# Patient Record
Sex: Female | Born: 1995 | Race: White | Hispanic: No | Marital: Single | Smoking: Former smoker
Health system: Southern US, Community
[De-identification: ages and names within clinical notes are randomized; demographics above are authoritative.]

## PROBLEM LIST (undated history)

## (undated) ENCOUNTER — Emergency Department (HOSPITAL_COMMUNITY): Admission: EM | Payer: No Typology Code available for payment source | Source: Home / Self Care

## (undated) DIAGNOSIS — F319 Bipolar disorder, unspecified: Secondary | ICD-10-CM

## (undated) DIAGNOSIS — F32A Depression, unspecified: Secondary | ICD-10-CM

## (undated) DIAGNOSIS — G932 Benign intracranial hypertension: Secondary | ICD-10-CM

## (undated) DIAGNOSIS — Z23 Encounter for immunization: Secondary | ICD-10-CM

## (undated) DIAGNOSIS — B001 Herpesviral vesicular dermatitis: Secondary | ICD-10-CM

## (undated) DIAGNOSIS — L509 Urticaria, unspecified: Secondary | ICD-10-CM

## (undated) HISTORY — DX: Herpesviral vesicular dermatitis: B00.1

## (undated) HISTORY — DX: Urticaria, unspecified: L50.9

## (undated) HISTORY — PX: OTHER SURGICAL HISTORY: SHX169

## (undated) HISTORY — PX: WISDOM TOOTH EXTRACTION: SHX21

## (undated) HISTORY — PX: TYMPANOSTOMY TUBE PLACEMENT: SHX32

## (undated) HISTORY — DX: Benign intracranial hypertension: G93.2

## (undated) HISTORY — DX: Encounter for immunization: Z23

---
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
## 2014-01-30 ENCOUNTER — Ambulatory Visit (INDEPENDENT_AMBULATORY_CARE_PROVIDER_SITE_OTHER): Payer: 59 | Admitting: Neurology

## 2014-01-30 ENCOUNTER — Encounter: Payer: Self-pay | Admitting: *Deleted

## 2014-01-30 ENCOUNTER — Encounter: Payer: Self-pay | Admitting: Neurology

## 2014-01-30 VITALS — BP 94/68 | HR 70 | Ht 64.96 in | Wt 191.3 lb

## 2014-01-30 DIAGNOSIS — R51 Headache: Secondary | ICD-10-CM

## 2014-01-30 DIAGNOSIS — H4710 Unspecified papilledema: Secondary | ICD-10-CM

## 2014-01-30 LAB — BASIC METABOLIC PANEL
BUN: 12 mg/dL (ref 6–23)
CALCIUM: 9.6 mg/dL (ref 8.4–10.5)
CO2: 24 meq/L (ref 19–32)
CREATININE: 0.75 mg/dL (ref 0.50–1.10)
Chloride: 109 mEq/L (ref 96–112)
Glucose, Bld: 88 mg/dL (ref 70–99)
Potassium: 4.6 mEq/L (ref 3.5–5.3)
Sodium: 140 mEq/L (ref 135–145)

## 2014-01-30 NOTE — Patient Instructions (Signed)
1.  MRI brain wwo contrast 2.  MRV brain 3.  Check blood work today 4.  Return to clinic in 3 weeks

## 2014-01-30 NOTE — Progress Notes (Signed)
[facility] Clinic Note - Initial Visit   Date: 01/30/2014  Emily Hernandez MRN: [id number] DOB: 09-17-1995   Dear Dr Wynetta Emery:  Thank you for your kind referral of Emily Hernandez for consultation of headaches and papilledema. Although her history is well known to you, please allow [name] to reiterate it for the purpose of our medical record. The patient was accompanied to the clinic by her parents who also provides collateral information.     History of Present Illness: Emily Hernandez is a 18 y.o. right-handed Caucasian female with no prior medical history presenting for evaluation of headaches and papilledema.    She has a long history of headaches which started in her teen years.  Headaches are bitemporal or at the base of her head, described as stabbing sensation.  Headaches last all day until she rests, occuring almost daily.  It is improved with rest and exedrin and worse with physical exertion (walking up steps) or change in temperatures.  She has associated photophobia and nausea.  She was previously getting headaches 2-3 times per week, but starting in March they occurred almost daily, which she initially attributed to poor vision so saw her eye doctor who prescribed new glasses for changes in visual acuity.  Papilledema was not present at the initial visit.  Despite changing her eye prescriptions, headaches did not improve so she was re-evaluated on 01/25/2014 by Dr. Len Blalock whose note were reviewed.  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  Visual field testing was normal.  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  Of note, she has gained 40lb in the past 1.5 years.  Yesterday morning, she woke up very lightheaded which was new any persisted throughout the rest of the day.  She also had an appointment  with her PCP yesterday and does not recall why she left an hour early.  Her step-father also says that she [place]   Denies any numbness/tingling, weakness, dysarthria, bowel/bladder incontinence.  No family history of migraines.     PMHx:  None  Past Surgical History  Procedure Laterality Date  . None       Medications:  Current Outpatient Prescriptions on File Prior to Visit  Medication Sig Dispense Refill  . ibuprofen (ADVIL,MOTRIN) 200 MG tablet Take 400-600 mg by mouth every 6 (six) hours as needed. For pain        No current facility-administered medications on file prior to visit.    Allergies:  Allergies  Allergen Reactions  . Penicillins     Swelling and Hives    Family History: Family History  Problem Relation Age of Onset  . GI Bleed Maternal Grandmother   . Heart attack Maternal Grandmother   . Healthy Brother     Social History: History   Social History  . Marital Status: Single    Spouse Name: N/A    Number of Children: 0  . Years of Education: N/A   Occupational History  . server    Social History Main Topics  . Smoking status: Current Every Day Smoker -- 0.25 packs/day for 2 years    Types: Cigarettes  . Smokeless tobacco: Former Systems developer    Quit date: 08/17/2012  . Alcohol Use: No  .  Drug Use: No  . Sexual Activity: No   Other Topics Concern  . Not on file   Social History Narrative   She is currently a Educational psychologist at [employer] level of education:  Some college   Lives with mother and stepfather.    Review of Systems:  CONSTITUTIONAL: No fevers, chills, night sweats, or weight loss.   EYES: +visual changes or eye pain ENT: No hearing changes.  No history of nose bleeds.   RESPIRATORY: No cough, wheezing and shortness of breath.   CARDIOVASCULAR: Negative for chest pain, and palpitations.   GI: Negative for abdominal discomfort, blood in  stools or black stools.  No recent change in bowel habits.   GU:  No history of incontinence.   MUSCLOSKELETAL: No history of joint pain or swelling.  No myalgias.   SKIN: Negative for lesions, rash, and itching.   HEMATOLOGY/ONCOLOGY: Negative for prolonged bleeding, bruising easily, and swollen nodes.  No history of cancer.   ENDOCRINE: Negative for cold or heat intolerance, polydipsia or goiter.   PSYCH:  No depression or anxiety symptoms.   NEURO: As Above.   Vital Signs:  BP 94/68  Pulse 70  Ht 5' 4.96" (1.65 m)  Wt 191 lb 5 oz (86.779 kg)  BMI 31.87 kg/m2  SpO2 98%   General Medical Exam:   General:  Well appearing, comfortable, obese.   Eyes/ENT: see cranial nerve examination.   Neck: No masses appreciated.  Full range of motion without tenderness.  No carotid bruits. Respiratory:  Clear to auscultation, good air entry bilaterally.   Cardiac:  Regular rate and rhythm, no murmur.   Extremities:  No deformities, edema, or skin discoloration. Good capillary refill.   Skin:  Skin color, texture, turgor normal. No rashes or lesions.  Neurological Exam: MENTAL STATUS including orientation to time, place, person, recent and remote memory, attention span and concentration, language, and fund of knowledge is normal.  Speech is not dysarthric.  CRANIAL NERVES: II:  No visual field defects.  Unremarkable fundi, unable to appreciate papilledema on non-dilated exam.   III-IV-VI: Pupils equal round and reactive to light.  Normal conjugate, extra-ocular eye movements in all directions of gaze.  No nystagmus.  No ptosis.   V:  Normal facial sensation.     VII:  Normal facial symmetry and movements. VIII:  Normal hearing and vestibular function.   IX-X:  Normal palatal movement.   XI:  Normal shoulder shrug and head rotation.   XII:  Normal tongue strength and range of motion, no deviation or fasciculation.  MOTOR:  No atrophy, fasciculations or abnormal movements.  No pronator drift.   Tone is normal.    Right Upper Extremity:    Left Upper Extremity:    Deltoid  5/5   Deltoid  5/5   Biceps  5/5   Biceps  5/5   Triceps  5/5   Triceps  5/5   Wrist extensors  5/5   Wrist extensors  5/5   Wrist flexors  5/5   Wrist flexors  5/5   Finger extensors  5/5   Finger extensors  5/5   Finger flexors  5/5   Finger flexors  5/5   Dorsal interossei  5/5   Dorsal interossei  5/5   Abductor pollicis  5/5   Abductor pollicis  5/5   Tone (Ashworth scale)  0  Tone (Ashworth scale)  0   Right Lower Extremity:    Left Lower Extremity:  Hip flexors  5/5   Hip flexors  5/5   Hip extensors  5/5   Hip extensors  5/5   Knee flexors  5/5   Knee flexors  5/5   Knee extensors  5/5   Knee extensors  5/5   Dorsiflexors  5/5   Dorsiflexors  5/5   Plantarflexors  5/5   Plantarflexors  5/5   Toe extensors  5/5   Toe extensors  5/5   Toe flexors  5/5   Toe flexors  5/5   Tone (Ashworth scale)  0  Tone (Ashworth scale)  0   MSRs:  Right                                                                 Left brachioradialis 2+  brachioradialis 2+  biceps 2+  biceps 2+  triceps 2+  triceps 2+  patellar 2+  patellar 2+  ankle jerk 2+  ankle jerk 2+  Hoffman no  Hoffman no  plantar response down  plantar response down   SENSORY:  Normal and symmetric perception of light touch, pinprick, vibration, and proprioception.  Romberg's sign absent.   COORDINATION/GAIT: Normal finger-to- nose-finger and heel-to-shin.  Intact rapid alternating movements bilaterally.  Able to rise from a chair without using arms.  Gait narrow based and stable. Tandem and stressed gait intact.    IMPRESSION/PLAN: Miss Coker is a 18 year-old female presenting for evaluation of headaches and papilledema.  Neurological exam is non-focal and I am unable to appreciate papilledema on non-dilated exam today.  I would like to obtain MRI brain and MRV wwo contrast to look for potential intracranial abnormalities, venous thrombosis  (given that she is on oral contraception), or signs of raised ICP.  High on the differential is idiopathic intracranial hypertension which was explained to patient and family.  If imaging is nondiagnostic, lumbar puncture with opening pressure will be the next step.  From a general standpoint of well-being, weight loss was encouraged.    I offered the patient/parents the opportunity to ask questions and answered them to the best of my ability.   I will see her back in 3 weeks or sooner as needed.    The duration of this appointment visit was 45 minutes of face-to-face time with the patient.  Greater than 50% of this time was spent in counseling, explanation of diagnosis, planning of further management, and coordination of care.   Thank you for allowing me to participate in patient's care.  If I can answer any additional questions, I would be pleased to do so.    Sincerely,    Donika K. Posey Pronto, DO

## 2014-01-30 NOTE — Progress Notes (Signed)
Note faxed.

## 2014-02-04 ENCOUNTER — Ambulatory Visit
Admission: RE | Admit: 2014-02-04 | Discharge: 2014-02-04 | Disposition: A | Discharge status: Home / Self Care | Payer: 59 | Source: Ambulatory Visit | Attending: Neurology | Admitting: Neurology

## 2014-02-04 DIAGNOSIS — R51 Headache: Secondary | ICD-10-CM

## 2014-02-04 DIAGNOSIS — H4710 Unspecified papilledema: Secondary | ICD-10-CM

## 2014-02-04 MED ORDER — GADOBENATE DIMEGLUMINE 529 MG/ML IV SOLN
18.0000 mL | Freq: Once | INTRAVENOUS | Status: AC | PRN
  Administered 2014-02-04: 18 mL via INTRAVENOUS

## 2014-02-05 ENCOUNTER — Other Ambulatory Visit: Payer: Self-pay | Admitting: *Deleted

## 2014-02-05 ENCOUNTER — Other Ambulatory Visit: Payer: Self-pay | Admitting: Neurology

## 2014-02-05 ENCOUNTER — Telehealth: Payer: Self-pay | Admitting: Neurology

## 2014-02-05 DIAGNOSIS — R42 Dizziness and giddiness: Secondary | ICD-10-CM

## 2014-02-05 DIAGNOSIS — R51 Headache: Principal | ICD-10-CM

## 2014-02-05 DIAGNOSIS — R519 Headache, unspecified: Secondary | ICD-10-CM

## 2014-02-05 DIAGNOSIS — G379 Demyelinating disease of central nervous system, unspecified: Secondary | ICD-10-CM

## 2014-02-05 NOTE — Telephone Encounter (Signed)
Called patient and discussed with mother the results of MRI/V brain.  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    Check CSF cell count and diff, protein, glucose, routine cultures, fungal cultures, IgG index, oligoclonal bands, myelin basic protein, flow cytology, ACE.  Specify in comments "need opening and closing pressure to evaluate for pseudotumor cerebri"    Donika K. Posey Pronto, DO

## 2014-02-05 NOTE — Telephone Encounter (Signed)
Order placed.  Emily Hernandez will call patient to set up the appointment.

## 2014-02-07 ENCOUNTER — Ambulatory Visit
Admission: RE | Admit: 2014-02-07 | Discharge: 2014-02-07 | Disposition: A | Discharge status: Home / Self Care | Payer: 59 | Source: Ambulatory Visit | Attending: Neurology | Admitting: Neurology

## 2014-02-07 ENCOUNTER — Other Ambulatory Visit: Payer: Self-pay | Admitting: Neurology

## 2014-02-07 ENCOUNTER — Other Ambulatory Visit: Payer: Self-pay | Admitting: *Deleted

## 2014-02-07 ENCOUNTER — Other Ambulatory Visit (HOSPITAL_COMMUNITY)
Admission: RE | Admit: 2014-02-07 | Discharge: 2014-02-07 | Disposition: A | Discharge status: Home / Self Care | Payer: 59 | Source: Ambulatory Visit | Attending: Neurology | Admitting: Neurology

## 2014-02-07 VITALS — BP 102/46 | HR 77

## 2014-02-07 DIAGNOSIS — G379 Demyelinating disease of central nervous system, unspecified: Secondary | ICD-10-CM

## 2014-02-07 DIAGNOSIS — R519 Headache, unspecified: Secondary | ICD-10-CM

## 2014-02-07 DIAGNOSIS — R42 Dizziness and giddiness: Secondary | ICD-10-CM

## 2014-02-07 DIAGNOSIS — R51 Headache: Secondary | ICD-10-CM

## 2014-02-07 LAB — GLUCOSE, CSF: GLUCOSE CSF: 59 mg/dL (ref 43–76)

## 2014-02-07 LAB — CSF CELL COUNT WITH DIFFERENTIAL
RBC Count, CSF: 0 cu mm
Tube #: 3
WBC, CSF: 1 cu mm (ref 0–5)

## 2014-02-07 LAB — PROTEIN, CSF: Total Protein, CSF: 28 mg/dL (ref 15–45)

## 2014-02-07 MED ORDER — DIAZEPAM 5 MG PO TABS
10.0000 mg | ORAL_TABLET | Freq: Once | ORAL | Status: AC
  Administered 2014-02-07: 5 mg via ORAL

## 2014-02-07 NOTE — Discharge Instructions (Signed)

## 2014-02-07 NOTE — Progress Notes (Signed)
Blood drawn from left AC to go with spinal fluid. Site is unremarkable and pt tolerated procedure well. Discharge instructions explained to pt.

## 2014-02-09 ENCOUNTER — Other Ambulatory Visit: Payer: Self-pay | Admitting: *Deleted

## 2014-02-09 ENCOUNTER — Telehealth: Payer: Self-pay | Admitting: Neurology

## 2014-02-09 ENCOUNTER — Encounter: Payer: Self-pay | Admitting: *Deleted

## 2014-02-09 LAB — MYELIN BASIC PROTEIN, CSF: Myelin Basic Protein: <2 mcg/L (ref 0.0–4.0)

## 2014-02-09 LAB — ANGIOTENSIN CONVERTING ENZYME, CSF: ACE, CSF: 1 U/L (ref <=15)

## 2014-02-09 MED ORDER — BUTALBITAL-APAP-CAFFEINE 50-325-40 MG PO TABS: 1.0000 | ORAL_TABLET | Freq: Four times a day (QID) | ORAL | Status: DC | PRN

## 2014-02-09 NOTE — Telephone Encounter (Signed)
Note and Rx faxed.

## 2014-02-09 NOTE — Telephone Encounter (Signed)
Returned call to patient.  Instructed to stay well hydrated and increase caffeine.  Fioricet sent to pharmacy.  Will give work excuse from 6/26-6/29 to fax to [phone] Attn:  Jodean Lima.  Donika K. Posey Pronto, DO

## 2014-02-09 NOTE — Telephone Encounter (Signed)
Patient just called stating that she has been having headaches everytime she gets up.  She has LP done on Wednesday.  She was on her way to work today but her head started hurting so bad she thought she was going to get sick.  She turned around and went back home.  Does she need to try to work?

## 2014-02-09 NOTE — Telephone Encounter (Signed)
Pt needs to talk to someone about headache [phone]

## 2014-02-10 LAB — CSF CULTURE: Gram Stain: NONE SEEN

## 2014-02-10 LAB — CSF CULTURE W GRAM STAIN
Gram Stain: NONE SEEN
Organism ID, Bacteria: NO GROWTH

## 2014-02-12 ENCOUNTER — Other Ambulatory Visit: Payer: Self-pay | Admitting: *Deleted

## 2014-02-12 ENCOUNTER — Telehealth: Payer: Self-pay | Admitting: Neurology

## 2014-02-12 DIAGNOSIS — R51 Headache: Secondary | ICD-10-CM

## 2014-02-12 NOTE — Telephone Encounter (Signed)
Pt's mother called wanting to know what is next since pt has had her lumbar puncture and is still having headaches.  C/b [phone]

## 2014-02-12 NOTE — Telephone Encounter (Signed)
Called Emily Hernandez to let her know that I am waiting to hear back about the appointment.

## 2014-02-12 NOTE — Telephone Encounter (Signed)
No relief with conservative measures.  Let's proceed with blood patch.  Discussed with Abigail Butts (patient's mom), who is agreeable.  They will cancel if headache improves.  Donika K. Posey Pronto, DO

## 2014-02-12 NOTE — Telephone Encounter (Signed)
Order placed.  Left message for Andee Poles to call me back to schedule appointment.

## 2014-02-12 NOTE — Telephone Encounter (Signed)
Please advise on what to tell her mother.  Thanks.

## 2014-02-13 ENCOUNTER — Other Ambulatory Visit: Payer: Self-pay | Admitting: *Deleted

## 2014-02-13 DIAGNOSIS — R51 Headache: Secondary | ICD-10-CM

## 2014-02-13 LAB — OLIGOCLONAL BANDS, CSF + SERM

## 2014-02-13 NOTE — Telephone Encounter (Signed)
Called Emily Hernandez back and she said that Solmon Ice has an appointment tomorrow at [facility]

## 2014-02-13 NOTE — Telephone Encounter (Signed)
Pt's mother called again at 12:05PM to f/u on the appointment. C/B [phone]

## 2014-02-14 ENCOUNTER — Inpatient Hospital Stay
Admission: RE | Admit: 2014-02-14 | Discharge: 2014-02-14 | Disposition: A | Discharge status: Home / Self Care | Payer: 59 | Source: Ambulatory Visit | Attending: Neurology | Admitting: Neurology

## 2014-02-14 LAB — HSV(HERPES SMPLX VRS)ABS-I+II(IGG)-CSF

## 2014-02-14 NOTE — Discharge Instructions (Signed)

## 2014-02-20 ENCOUNTER — Ambulatory Visit (INDEPENDENT_AMBULATORY_CARE_PROVIDER_SITE_OTHER): Payer: 59 | Admitting: Neurology

## 2014-02-20 ENCOUNTER — Encounter: Payer: Self-pay | Admitting: Neurology

## 2014-02-20 VITALS — BP 110/78 | HR 71 | Ht 65.35 in | Wt 195.4 lb

## 2014-02-20 DIAGNOSIS — E669 Obesity, unspecified: Secondary | ICD-10-CM

## 2014-02-20 DIAGNOSIS — G932 Benign intracranial hypertension: Secondary | ICD-10-CM | POA: Insufficient documentation

## 2014-02-20 MED ORDER — TOPIRAMATE 25 MG PO TABS: ORAL_TABLET | ORAL | Status: DC

## 2014-02-20 NOTE — Progress Notes (Signed)
Follow-up Visit   Date: 02/20/2014    Emily Hernandez MRN: [id number] DOB: November 04, 1995   Interim History: Emily Hernandez is a 18 y.o. right-handed Caucasian female with no prior medical history returning to the clinic for follow-up of headaches and papilledema.  The patient was accompanied to the clinic by parents who also provides collateral information.    History of present illness: She has a long history of headaches which started in her teen years. Headaches are bitemporal or at the base of her head, described as stabbing sensation. Headaches last all day until she rests, occuring almost daily. It is improved with rest and exedrin and worse with physical exertion (walking up steps) or change in temperatures. She has associated photophobia and nausea. She was previously getting headaches 2-3 times per week, but starting in March they occurred almost daily, which she initially attributed to poor vision so saw her eye doctor who prescribed new glasses for changes in visual acuity. Papilledema was not present at the initial visit. Despite changing her eye prescriptions, headaches did not improve so she was re-evaluated on 01/25/2014 by Dr. Len Blalock whose note were reviewed. 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 Visual field testing was normal. 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 Of note, she has gained 40lb in the past 1.5 years.   Yesterday morning, she woke up very lightheaded which was new any persisted throughout the rest of the day. She also had an appointment with her PCP yesterday and does not recall why she left an hour early. Her step-father also says that she [place]   UPDATE 02/20/2014:  She underwent MRI/V brain which was normal and lumbar  puncture which showed OP 73mmHg with normal cell count and protein.  Unfortunately, she had post-LP headache which fortunately did improve with conservative measures.   Since the past week, she has not experienced constant daily headaches, but continues to have intermittent stabbing pain over the bitemporal region, lasting 5-10 seconds, occuring 3-4 times throughout the day especially when active.  She has not needed to take any medication for these because they are too brief.      Medications:  Current Outpatient Prescriptions on File Prior to Visit  Medication Sig Dispense Refill  . butalbital-acetaminophen-caffeine (FIORICET, ESGIC) 50-325-40 MG per tablet Take 1 tablet by mouth every 6 (six) hours as needed for headache.  20 tablet  0  . ibuprofen (ADVIL,MOTRIN) 200 MG tablet Take 400-600 mg by mouth every 6 (six) hours as needed. For pain       . medroxyPROGESTERone (DEPO-PROVERA) 150 MG/ML injection        No current facility-administered medications on file prior to visit.    Allergies:  Allergies  Allergen Reactions  . Penicillins     Swelling and Hives     Review of Systems:  CONSTITUTIONAL: No fevers, chills, night sweats, or weight loss.   EYES: No visual changes or eye pain ENT: No hearing changes.  No history of nose bleeds.   RESPIRATORY: No cough, wheezing and shortness of breath.   CARDIOVASCULAR: Negative for chest pain, and palpitations.   GI: Negative for abdominal discomfort, blood in stools or black stools.  No recent change in bowel habits.   GU:  No history of incontinence.   MUSCLOSKELETAL: No history of joint pain or swelling.  No myalgias.   SKIN: Negative for lesions, rash, and itching.   ENDOCRINE: Negative for cold or heat intolerance, polydipsia or goiter.   PSYCH:  No depression or anxiety symptoms.   NEURO: As Above.   Vital Signs:  BP 110/78  Pulse 71  Ht 5' 5.35" (1.66 m)  Wt 195 lb 6 oz (88.622 kg)  BMI 32.16 kg/m2  SpO2  98%  Neurological Exam: MENTAL STATUS including orientation to time, place, person, recent and remote memory, attention span and concentration, language, and fund of knowledge is normal.  Speech is not dysarthric.  CRANIAL NERVES: No visual field defects.  Pupils equal round and reactive to light.  Normal conjugate, extra-ocular eye movements in all directions of gaze.  No ptosis. Normal facial sensation.  Face is symmetric. Palate elevates symmetrically.  Tongue is midline.  MOTOR:  Motor strength is 5/5 in all extremities.  No atrophy, fasciculations or abnormal movements.  No pronator drift.  Tone is normal.    MSRs:  Reflexes are 2+/4 throughout..  SENSORY:  Intact to to vibration throughout.  COORDINATION/GAIT:  Normal finger-to- nose-finger and heel-to-shin.  Intact rapid alternating movements bilaterally.  Gait narrow based and stable.   Data: CSF 02/07/2014:  R0 W1 G59 P28    ACE 1, MBP < 2.0, no OCB, HSV neg, cytology negative, cytometry (insufficient sample)  MRI/V brain wwo contrast 02/07/2014: No evidence of venous thrombosis.  Normal appearance of the brain parenchyma. 5 x 7 mm pineal cyst. These are seen in up to 5% of normal examinations. Usually, these are incidental and insignificant, especially when this small and not associated with any complicating features. In certain atypical cases, they can be associated with headache.   IMPRESSION: 1.  Idiopathic intracranial hypertension, mild  - OP 64mmHg after 19.56mL CSF removed, CP was 51mmHg (02/07/2014)  - Imaging does not disclosure any intracranial abnormalities.  Exam non-focal without evidence of papilledema  - Start topamax.  - Encouraged weight loss  - Work up reviewed with patient and mother.  Extensive discussion on the pathophysiology, management, and prognosis  2.  Chronic migraine 3.  Primary ice pick headache 4.  Obesity   PLAN/RECOMMENDATIONS:  1. Take topiramate:     AM   PM   Week 1  0   25mg    Week 2   25mg   25mg    Week 3  25mg    50mg  tablet   Week 4  50mg    50mg  tablet   Risks and benefits of medications discussed, including cessation of medication if patient was planning on getting pregnancy due to risk of fetal defects.  2.  Strongly encouraged weight loss and discusses strategies 3.  Call with update in 1 month 3.  Follow-up with eye doctor in 6-8 weeks 4.  Return to clinic in 100-months    The duration of this appointment visit was 40 minutes of face-to-face time with the patient.  Greater than 50% of this time was spent in counseling, explanation of diagnosis, planning of further management, and coordination of care.   Thank you for allowing me to participate in patient's care.  If I can answer any additional questions, I would be pleased to do so.    Sincerely,    Carmilla Granville K. Posey Pronto, DO

## 2014-02-20 NOTE — Patient Instructions (Addendum)
1. Take topiramate as follows:     AM    PM   Week 1 0    25mg  (1 tab)  Week 2  25mg  (1 tab)  25mg   (1 tab)  Week 3  25mg  (1 tab)  50mg   (2 tab)  Week 4  50mg  (2 tab)  50mg   (2 tab) 2.  Call with update in 1 month to let me know how you are doing so I can prescribe 50mg  tablets 3.  Follow-up with eye doctor in 6-8 weeks 4.  Strongly encouraged weight loss 5.  Return to clinic in 29-months

## 2014-02-20 NOTE — Progress Notes (Signed)
Note faxed.

## 2014-03-06 LAB — FUNGUS CULTURE W SMEAR: Smear Result: NONE SEEN

## 2014-04-03 ENCOUNTER — Other Ambulatory Visit: Payer: Self-pay | Admitting: *Deleted

## 2014-04-03 ENCOUNTER — Telehealth: Payer: Self-pay | Admitting: Neurology

## 2014-04-03 MED ORDER — TOPIRAMATE 25 MG PO TABS: ORAL_TABLET | ORAL | Status: DC

## 2014-04-03 NOTE — Telephone Encounter (Signed)
Pt wants a refill on topamax

## 2014-04-03 NOTE — Telephone Encounter (Signed)
Order sent.

## 2014-04-24 ENCOUNTER — Encounter: Payer: Self-pay | Admitting: Neurology

## 2014-04-24 ENCOUNTER — Ambulatory Visit (INDEPENDENT_AMBULATORY_CARE_PROVIDER_SITE_OTHER): Payer: 59 | Admitting: Neurology

## 2014-04-24 VITALS — BP 100/70 | HR 92 | Ht 65.0 in | Wt 186.4 lb

## 2014-04-24 DIAGNOSIS — R5381 Other malaise: Secondary | ICD-10-CM

## 2014-04-24 DIAGNOSIS — R5383 Other fatigue: Secondary | ICD-10-CM

## 2014-04-24 DIAGNOSIS — G932 Benign intracranial hypertension: Secondary | ICD-10-CM

## 2014-04-24 LAB — TSH: TSH: 2.698 u[IU]/mL (ref 0.350–4.500)

## 2014-04-24 LAB — VITAMIN B12: Vitamin B-12: 329 pg/mL (ref 211–911)

## 2014-04-24 NOTE — Patient Instructions (Addendum)
1.  Increase topamax to 75mg  twice daily over one week 2.  Check TSH, vitamin B12, and vitamin D25 3.  Encouraged weight loss 4.  Follow-up with eye doctor 5.  Return to clinic in 20-months

## 2014-04-24 NOTE — Progress Notes (Signed)
Follow-up Visit   Date: 04/24/2014    Emily Hernandez MRN: [id number] DOB: 1996-01-10   Interim History: Emily Hernandez is a 18 y.o. right-handed Caucasian female with no prior medical history returning to the clinic for follow-up of headaches and papilledema.  The patient was accompanied to the clinic by parents who also provides collateral information.    History of present illness: She has a long history of headaches which started in her teen years. Headaches are bitemporal or at the base of her head, described as stabbing sensation. Headaches last all day until she rests, occuring almost daily. It is improved with rest and exedrin and worse with physical exertion (walking up steps) or change in temperatures. She has associated photophobia and nausea. She was previously getting headaches 2-3 times per week, but starting in March they occurred almost daily, which she initially attributed to poor vision so saw her eye doctor who prescribed new glasses for changes in visual acuity. Papilledema was not present at the initial visit. Despite changing her eye prescriptions, headaches did not improve so she was re-evaluated on 01/25/2014 by Dr. Len Blalock whose note were reviewed. 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 Visual field testing was normal. 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 Of note, she has gained 40lb in the past 1.5 years.  UPDATE 02/20/2014:  She underwent MRI/V brain which was normal and lumbar puncture which showed OP 15mmHg with normal cell count and protein.  Unfortunately, she had post-LP headache which improved with conservative measures.   Since the past week, she has not experienced constant daily headaches, but continues to have intermittent stabbing pain over the bitemporal region, lasting 5-10 seconds, occuring 3-4  times throughout the day especially when active.  She has not needed to take any medication for these because they are too brief.    UPDATE 04/24/2014:  For the past several weeks, she has noticed headaches occuring 2-3 times per week, especially when stressed at work.  She is tolerating topamax 50mg  BID and has minimal paresthesias of the finger tips and toes.  She complains of excessive sleepiness during the day and generalized fatigue. She had not had her eyes checked again.  She has noticed two bumps over the left upper eye lid, painful at times and inflamed.     Medications:  Current Outpatient Prescriptions on File Prior to Visit  Medication Sig Dispense Refill  . butalbital-acetaminophen-caffeine (FIORICET, ESGIC) 50-325-40 MG per tablet Take 1 tablet by mouth every 6 (six) hours as needed for headache.  20 tablet  0  . ibuprofen (ADVIL,MOTRIN) 200 MG tablet Take 400-600 mg by mouth every 6 (six) hours as needed. For pain       . medroxyPROGESTERone (DEPO-PROVERA) 150 MG/ML injection       . topiramate (TOPAMAX) 25 MG tablet 2 tablets twice a day  120 tablet  3   No current facility-administered medications on file prior to visit.    Allergies:  Allergies  Allergen Reactions  . Penicillins     Swelling and Hives    Review of Systems:  CONSTITUTIONAL: No fevers, chills, night sweats, or weight loss.   EYES: +visual changes or eye pain ENT: No hearing changes.  No history of nose bleeds.   RESPIRATORY: No cough, wheezing and shortness of breath.   CARDIOVASCULAR: Negative for  chest pain, and palpitations.   GI: Negative for abdominal discomfort, blood in stools or black stools.  No recent change in bowel habits.   GU:  No history of incontinence.   MUSCLOSKELETAL: No history of joint pain or swelling.  No myalgias.   SKIN: Negative for lesions, rash, and itching.   ENDOCRINE: Negative for cold or heat intolerance, polydipsia or goiter.   PSYCH:  No depression or anxiety symptoms.    NEURO: As Above.   Vital Signs:  BP 100/70  Pulse 92  Ht 5\' 5"  (1.651 m)  Wt 186 lb 6 oz (84.539 kg)  BMI 31.01 kg/m2  SpO2 99%  Neurological Exam: MENTAL STATUS including orientation to time, place, person, recent and remote memory, attention span and concentration, language, and fund of knowledge is normal.  Speech is not dysarthric.  CRANIAL NERVES:  Unremarkable fundi bilaterally, disc margins appear sharp. No visual field defects.  Pupils equal round and reactive to light.  Normal conjugate, extra-ocular eye movements in all directions of gaze.  No ptosis. Face is symmetric. Palate elevates symmetrically.  Tongue is midline.  Two palpable nodular areas over the left upper lid.  MOTOR:  Motor strength is 5/5 in all extremities.    MSRs:  Reflexes are 2+/4 throughout.  COORDINATION/GAIT:  Normal finger-to- nose-finger   Gait narrow based and stable.   Data: CSF 02/07/2014:  R0 W1 G59 P28    ACE 1, MBP < 2.0, no OCB, HSV neg, cytology negative, cytometry (insufficient sample)  MRI/V brain wwo contrast 02/07/2014: No evidence of venous thrombosis.  Normal appearance of the brain parenchyma. 5 x 7 mm pineal cyst. These are seen in up to 5% of normal examinations. Usually, these are incidental and insignificant, especially when this small and not associated with any complicating features. In certain atypical cases, they can be associated with headache.   IMPRESSION/PLAN: 1.  Idiopathic intracranial hypertension, mild  - OP 42mmHg after 19.63mL CSF removed, CP was 80mmHg (02/07/2014)  - Imaging does not disclosure any intracranial abnormalities.  Exam non-focal without evidence of papilledema  - Clinically doing stable, still with headaches occuring 2-3 times per week and intermittent vision changes  -  Increase topamax to 75mg  BID over one week  - Encouraged weight loss, she has lost 9lb since last visit!  - Follow-up with eye doctor in 6-8 weeks for eye lid abnormalities and  dilated eye exam 2.  Chronic migraine 3.  Primary ice pick headache 4.  Obesity 5.  Fatigue  - Check TSH, vitamin B12, and vitamin D25 6.  Return to clinic in 75-months    The duration of this appointment visit was 25 minutes of face-to-face time with the patient.  Greater than 50% of this time was spent in counseling, explanation of diagnosis, planning of further management, and coordination of care.   Thank you for allowing me to participate in patient's care.  If I can answer any additional questions, I would be pleased to do so.    Sincerely,    Donika K. Posey Pronto, DO

## 2014-04-24 NOTE — Progress Notes (Signed)
Note faxed.

## 2014-04-25 ENCOUNTER — Telehealth: Payer: Self-pay | Admitting: Neurology

## 2014-04-25 LAB — VITAMIN D 25 HYDROXY (VIT D DEFICIENCY, FRACTURES): Vit D, 25-Hydroxy: 28 ng/mL — ABNORMAL LOW (ref 30–89)

## 2014-04-25 NOTE — Telephone Encounter (Signed)
Pt called/returning the nurse's call regarding her results of her blood work. Please call her back #[phone]

## 2014-05-03 ENCOUNTER — Other Ambulatory Visit: Payer: Self-pay | Admitting: *Deleted

## 2014-05-03 ENCOUNTER — Telehealth: Payer: Self-pay | Admitting: Neurology

## 2014-05-03 MED ORDER — TOPIRAMATE 25 MG PO TABS: ORAL_TABLET | ORAL | Status: DC

## 2014-05-03 NOTE — Telephone Encounter (Signed)
Patient calling to request refill on her Topamax

## 2014-05-03 NOTE — Telephone Encounter (Signed)
[facility] Notes rec'd dated 04/25/2014: based on laser opthalmoscopy:  Slight improvement in overall edema but still slightly elevated bilaterally.  Please inform patient that discs are still elevated, we can increase topamax to 50mg  twice daily.  Start by taking topamax 50mg  (2 tabs)  in the morning and 25mg  at bedtime x 1 week, then increase to 50mg  twice daily.  If tolerating this dose, we can call in a new Rx for 50mg  tablets.  Thanks.  Donika K. Posey Pronto, DO

## 2014-05-03 NOTE — Telephone Encounter (Signed)
Rx sent 

## 2014-05-24 ENCOUNTER — Encounter: Payer: Self-pay | Admitting: *Deleted

## 2014-05-24 ENCOUNTER — Telehealth: Payer: Self-pay | Admitting: Neurology

## 2014-05-24 ENCOUNTER — Telehealth: Payer: Self-pay | Admitting: *Deleted

## 2014-05-24 ENCOUNTER — Other Ambulatory Visit: Payer: Self-pay | Admitting: *Deleted

## 2014-05-24 MED ORDER — TOPIRAMATE 25 MG PO TABS: ORAL_TABLET | ORAL | Status: DC

## 2014-05-24 NOTE — Telephone Encounter (Signed)
Patient notified

## 2014-05-24 NOTE — Telephone Encounter (Signed)
Noted.  Emily Hernandez K. Garfield Coiner, DO   

## 2014-05-24 NOTE — Telephone Encounter (Signed)
FYI:  Your last note says 75 mg bid so I called the pharmacy back and changed it.

## 2014-05-24 NOTE — Telephone Encounter (Signed)
Pt called requesting a refill for TOPIRAMATE 25MG   Pharmacy: [facility]  C/B [phone]

## 2014-05-24 NOTE — Telephone Encounter (Signed)
Spoke with Helene Kelp at [facility] and she said that patient still has refills.  Sent in another Rx anyway and she said that they will keep it on file.

## 2014-08-07 ENCOUNTER — Telehealth: Payer: Self-pay | Admitting: Neurology

## 2014-08-07 NOTE — Telephone Encounter (Signed)
I called [facility] and they did get the latest increase back in October.  75 mg tid.  Rx called in for 1 month supply with 5 refills.

## 2014-08-07 NOTE — Telephone Encounter (Signed)
Pt called stating that she can not get her refill for TOPIRAMATE 25mg  until Saturday and pt belived that the dosage is wrong. Please call pt # [phone]

## 2014-08-23 ENCOUNTER — Telehealth: Payer: Self-pay | Admitting: Neurology

## 2014-08-23 NOTE — Telephone Encounter (Signed)
Pt canceled her f/u for 08/24/14. Pt did not give a reason.

## 2014-08-24 ENCOUNTER — Ambulatory Visit: Payer: 59 | Admitting: Neurology

## 2014-08-27 ENCOUNTER — Telehealth: Payer: Self-pay | Admitting: Neurology

## 2014-08-27 MED ORDER — TOPIRAMATE 50 MG PO TABS: 75.0000 mg | ORAL_TABLET | Freq: Two times a day (BID) | ORAL | Status: DC

## 2014-08-27 NOTE — Telephone Encounter (Signed)
Spoke with patients mother she was call to see if she really needed to keep her follow up appointment there has really not been any change in he headaches and she recently ost her insurance but how ever she thinks she needs more medication to make it until hr father gets more insurance she will call me back on the medication.

## 2014-08-27 NOTE — Telephone Encounter (Signed)
Noted.  I will send new Rx for topamax 75mg  BID.  She should get eyes checked if there is any worsening of vision or headaches.  Mycala Warshawsky K. Posey Pronto, DO

## 2014-08-27 NOTE — Telephone Encounter (Signed)
Pt mother wendy called and needs to talk to someone about medication please call [phone]

## 2014-08-27 NOTE — Telephone Encounter (Signed)
lmom for patients mother advise her to give [name] a call back if she had any other questions

## 2014-10-24 ENCOUNTER — Telehealth: Payer: Self-pay | Admitting: *Deleted

## 2014-10-24 NOTE — Telephone Encounter (Signed)
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  Emily Hernandez is on Topamax which Emily Hernandez does not use on a regular basis.  Emily Hernandez is also getting depo shots.  Could this be causing the headaches.  Do we need to change or come off birth control?  Do we need to change Topamax?   Office #: [phone]  Cell #: [phone]

## 2014-10-25 NOTE — Telephone Encounter (Signed)
Called Dr. Wynetta Emery regarding mutual patient and discussed her care.  Her optic nerves have improved since evaluation 53-months ago, but there continues to be mild edema.  No visual field loss.    I would recommend stopping birth control or doing progesterone only.  Let's bring her back for a follow-up so I can get a better idea of her headaches and determine if any medication changes need to be made.  Zoraya Fiorenza K. Posey Pronto, DO

## 2014-10-25 NOTE — Telephone Encounter (Signed)
Left message for patient to call me back. 

## 2014-11-01 NOTE — Telephone Encounter (Signed)
Called patient again and left message for her to call me back for a follow up appt.

## 2014-11-02 NOTE — Telephone Encounter (Signed)
Patient has not called me back since March 10.

## 2014-11-08 ENCOUNTER — Encounter: Payer: Self-pay | Admitting: Neurology

## 2014-11-08 ENCOUNTER — Ambulatory Visit (INDEPENDENT_AMBULATORY_CARE_PROVIDER_SITE_OTHER): Payer: No Typology Code available for payment source | Admitting: Neurology

## 2014-11-08 VITALS — BP 120/70 | HR 105 | Ht 63.6 in | Wt 163.8 lb

## 2014-11-08 DIAGNOSIS — G932 Benign intracranial hypertension: Secondary | ICD-10-CM

## 2014-11-08 DIAGNOSIS — H4710 Unspecified papilledema: Secondary | ICD-10-CM | POA: Diagnosis not present

## 2014-11-08 MED ORDER — TOPIRAMATE ER 50 MG PO CAP24: 50.0000 mg | ORAL_CAPSULE | Freq: Every day | ORAL | Status: DC

## 2014-11-08 MED ORDER — TOPIRAMATE ER 100 MG PO CAP24: 100.0000 mg | ORAL_CAPSULE | Freq: Every day | ORAL | Status: DC

## 2014-11-08 MED ORDER — TOPIRAMATE ER 50 MG PO CAP24
50.0000 mg | ORAL_CAPSULE | Freq: Every day | ORAL | Status: DC
Start: 2014-11-08 — End: 2017-12-30

## 2014-11-08 NOTE — Progress Notes (Signed)
Done

## 2014-11-08 NOTE — Progress Notes (Signed)
Follow-up Visit   Date: 11/08/2014    Emily Hernandez MRN: [id number] DOB: [birth year]   Interim History: Emily Hernandez is a 19 y.o. right-handed Caucasian female with no prior medical history returning to the clinic for follow-up of headaches and papilledema.  The patient was accompanied to the clinic by parents who also provides collateral information.    History of present illness: She has a long history of headaches which started in her teen years. Headaches are bitemporal or at the base of her head, described as stabbing sensation. Headaches last all day until she rests, occuring almost daily. It is improved with rest and exedrin and worse with physical exertion (walking up steps) or change in temperatures. She has associated photophobia and nausea. She was previously getting headaches 2-3 times per week, but starting in March they occurred almost daily, which she initially attributed to poor vision so saw her eye doctor who prescribed new glasses for changes in visual acuity. Papilledema was not present at the initial visit. Despite changing her eye prescriptions, headaches did not improve so she was re-evaluated on 01/25/2014 by Dr. Len Blalock whose note were reviewed. 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 Visual field testing was normal. 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 Of note, she has gained 40lb in the past 1.5 years.  UPDATE 02/20/2014:  She underwent MRI/V brain which was normal and lumbar puncture which showed OP 46mmHg with normal cell count and protein.  Unfortunately, she had post-LP headache which improved with conservative measures.   Since the past week, she has not experienced constant daily headaches, but continues to have intermittent stabbing pain over the bitemporal region, lasting 5-10 seconds, occuring 3-4  times throughout the day especially when active.  She has not needed to take any medication for these because they are too brief.    UPDATE 04/24/2014:  For the past several weeks, she has noticed headaches occuring 2-3 times per week, especially when stressed at work.  She is tolerating topamax 50mg  BID and has minimal paresthesias of the finger tips and toes.  She complains of excessive sleepiness during the day and generalized fatigue. She had not had her eyes checked again.  She has noticed two bumps over the left upper eye lid, painful at times and inflamed.    UPDATE 11/08/2014:  Since her last visit, she slowly started to become less compliant with her topamax.  In February 2016, her headaches started worsening so went back to see her eye doctor.  She is now taking topamax 75mg  BID and reports having significant improvement in her headaches.  Headaches are minor "baby headaches" in the morning.  No vision complaints.  She has lost 30lb since July 2015.  She saw her eye doctor, Dr. 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 However, since taking topamax regularly, she is doing well.     Medications:  Current Outpatient Prescriptions on File Prior to Visit  Medication Sig Dispense Refill  . ibuprofen (ADVIL,MOTRIN) 200 MG tablet Take 400-600 mg by mouth every 6 (six) hours as needed. For pain     . medroxyPROGESTERone (DEPO-PROVERA) 150 MG/ML injection     . topiramate (TOPAMAX) 50 MG tablet Take 1.5  tablets (75 mg total) by mouth 2 (two) times daily. 90 tablet 5   No current facility-administered medications on file prior to visit.    Allergies:  Allergies  Allergen Reactions  . Penicillins     Swelling and Hives    Review of Systems:  CONSTITUTIONAL: No fevers, chills, night sweats, or weight loss.   EYES: +visual changes or eye pain ENT: No hearing changes.   No history of nose bleeds.   RESPIRATORY: No cough, wheezing and shortness of breath.   CARDIOVASCULAR: Negative for chest pain, and palpitations.   GI: Negative for abdominal discomfort, blood in stools or black stools.  No recent change in bowel habits.   GU:  No history of incontinence.   MUSCLOSKELETAL: No history of joint pain or swelling.  No myalgias.   SKIN: Negative for lesions, rash, and itching.   ENDOCRINE: Negative for cold or heat intolerance, polydipsia or goiter.   PSYCH:  No depression or anxiety symptoms.   NEURO: As Above.   Vital Signs:  BP 120/70 mmHg  Pulse 105  Ht 5' 3.6" (1.615 m)  Wt 163 lb 12.8 oz (74.299 kg)  BMI 28.49 kg/m2  SpO2 98%  Neurological Exam: MENTAL STATUS including orientation to time, place, person, recent and remote memory, attention span and concentration, language, and fund of knowledge is normal.  Speech is not dysarthric.  CRANIAL NERVES:  Unremarkable fundi bilaterally, disc margins appear sharp. No visual field defects.  Pupils equal round and reactive to light.  Normal conjugate, extra-ocular eye movements in all directions of gaze.  No ptosis. Face is symmetric. Palate elevates symmetrically.  Tongue is midline.    MOTOR:  Motor strength is 5/5 in all extremities.    MSRs:  Reflexes are 2+/4 throughout.  COORDINATION/GAIT:  Normal finger-to- nose-finger   Gait narrow based and stable.   Data: CSF 02/07/2014:  R0 W1 G59 P28    ACE 1, MBP < 2.0, no OCB, HSV neg, cytology negative, cytometry (insufficient sample)  MRI/V brain wwo contrast 02/07/2014: No evidence of venous thrombosis.  Normal appearance of the brain parenchyma. 5 x 7 mm pineal cyst. These are seen in up to 5% of normal examinations. Usually, these are incidental and insignificant, especially when this small and not associated with any complicating features. In certain atypical cases, they can be associated with headache.   IMPRESSION/PLAN: 1.  Idiopathic intracranial  hypertension, mild  - OP 94mmHg after 19.30mL CSF removed, CP was 26mmHg (02/07/2014)  - Clinically headaches much improved since being compliant with medications.  She has lost an remarkable 30lb since July!  - Switch to extended release topamax to help with complaince and side effects.  Start Trokendi 150mg  daily (100mg  tab + 50mg  tab)  - Ok to continue depo provera as this is progesterone only, however if symptoms worsen, may need to discontinue 2.  Chronic migraine 3.  Primary ice pick headache 4.  Obesity   - Praised her on weight loss 5.  Return to clinic in 12-months   The duration of this appointment visit was 30 minutes of face-to-face time with the patient.  Greater than 50% of this time was spent in counseling, explanation of diagnosis, planning of further management, and coordination of care.   Thank you for allowing me to participate in patient's care.  If I can answer any additional questions, I would be pleased to do so.    Sincerely,    Patrice Matthew K. Posey Pronto, DO

## 2014-11-08 NOTE — Patient Instructions (Addendum)
1.  We only have 50mg  tablets as samples, so please start taking 3 in the morning, if you are tolerating it, start taking topiramate extended release 100mg  tablet + 50mg  tablet daily. 2.  I will see you back in 12-months

## 2014-11-08 NOTE — Progress Notes (Signed)
Note sent to PCP.  Will try to locate Dr. Tammi Klippel.

## 2015-04-06 IMAGING — RF DG FLUORO GUIDE LUMBAR PUNCTURE
1 series · 2 of 2 positions shown · non-contrast
Comparison: none

CLINICAL DATA: Daily headache and dizziness. Evaluate for
demyelinating disease or pseudotumor.

[Series 1: dg fluoro guide lumbar puncture · 2 of 2 slices shown]
[im 1/2]
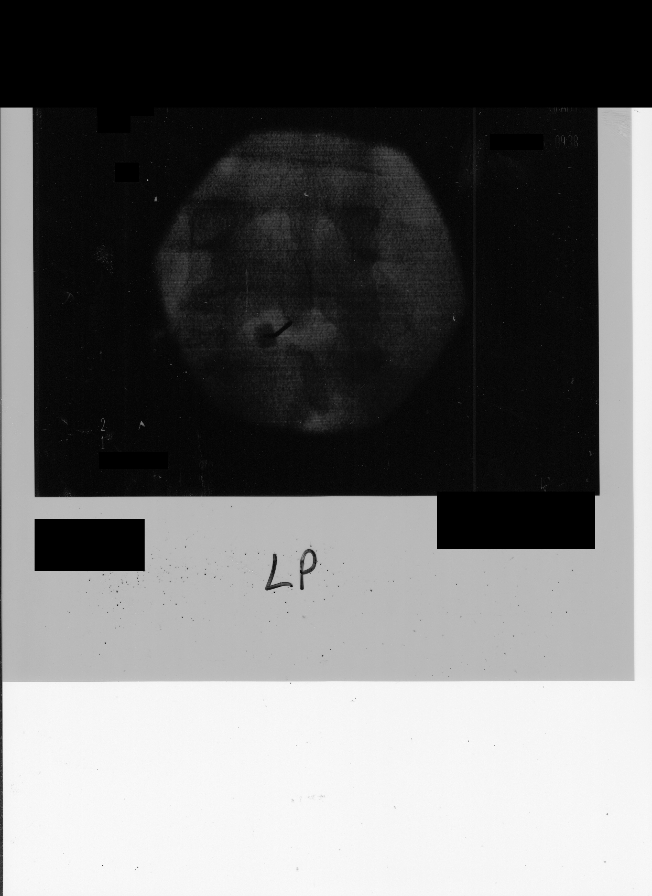
[im 2/2]
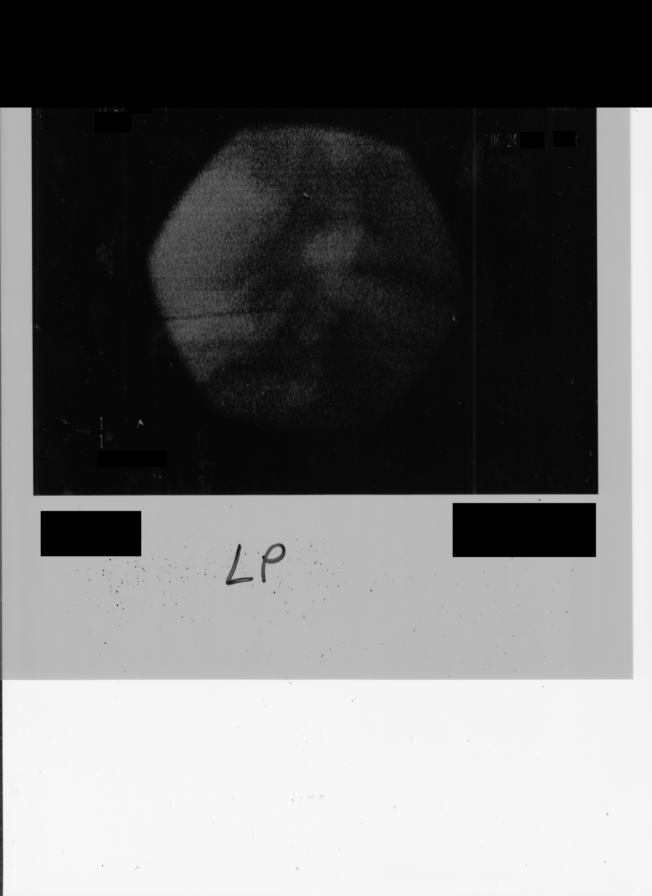

[2 of 2 positions shown; findings below may reference images not displayed]

EXAM:
DIAGNOSTIC LUMBAR PUNCTURE UNDER FLUOROSCOPIC GUIDANCE

FLUOROSCOPY TIME:  0 min 14 seconds

PROCEDURE:
Informed consent was obtained from the patient prior to the
procedure, including potential complications of headache, allergy,
and pain. With the patient prone, the lower back was prepped with
Betadine. 1% Lidocaine was used for local anesthesia. Lumbar
puncture was performed at the L4-5 level using a left paramedian
approach with a 3.5 inch, 20 gauge needle with return of clear CSF.
Opening pressure was 24 cm water (measured in the left lateral
decubitus position). 19.5 ml of CSF were obtained for laboratory
studies. Closing pressure was 9 cm water. The patient tolerated the
procedure well and there were no apparent complications.
IMPRESSION: Successful fluoroscopic guided lumbar puncture. Opening pressure 24
cm water.

## 2015-05-10 ENCOUNTER — Ambulatory Visit: Payer: No Typology Code available for payment source | Admitting: Neurology

## 2017-01-07 DIAGNOSIS — Z719 Counseling, unspecified: Secondary | ICD-10-CM | POA: Diagnosis not present

## 2017-01-07 DIAGNOSIS — E669 Obesity, unspecified: Secondary | ICD-10-CM | POA: Diagnosis not present

## 2017-01-07 DIAGNOSIS — F17200 Nicotine dependence, unspecified, uncomplicated: Secondary | ICD-10-CM | POA: Diagnosis not present

## 2017-01-07 DIAGNOSIS — Z79899 Other long term (current) drug therapy: Secondary | ICD-10-CM | POA: Diagnosis not present

## 2017-01-07 DIAGNOSIS — Z3042 Encounter for surveillance of injectable contraceptive: Secondary | ICD-10-CM | POA: Diagnosis not present

## 2017-03-02 ENCOUNTER — Emergency Department
Admission: EM | Admit: 2017-03-02 | Discharge: 2017-03-02 | Disposition: A | Discharge status: Home / Self Care | Payer: Worker's Compensation | Attending: Emergency Medicine | Admitting: Emergency Medicine

## 2017-03-02 ENCOUNTER — Encounter: Payer: Self-pay | Admitting: Emergency Medicine

## 2017-03-02 DIAGNOSIS — Z79899 Other long term (current) drug therapy: Secondary | ICD-10-CM | POA: Diagnosis not present

## 2017-03-02 DIAGNOSIS — S0993XA Unspecified injury of face, initial encounter: Secondary | ICD-10-CM | POA: Diagnosis present

## 2017-03-02 DIAGNOSIS — Y990 Civilian activity done for income or pay: Secondary | ICD-10-CM | POA: Insufficient documentation

## 2017-03-02 DIAGNOSIS — F17210 Nicotine dependence, cigarettes, uncomplicated: Secondary | ICD-10-CM | POA: Diagnosis not present

## 2017-03-02 DIAGNOSIS — Y93K9 Activity, other involving animal care: Secondary | ICD-10-CM | POA: Diagnosis not present

## 2017-03-02 DIAGNOSIS — W540XXA Bitten by dog, initial encounter: Secondary | ICD-10-CM | POA: Insufficient documentation

## 2017-03-02 DIAGNOSIS — S0181XA Laceration without foreign body of other part of head, initial encounter: Secondary | ICD-10-CM | POA: Insufficient documentation

## 2017-03-02 DIAGNOSIS — Y9259 Other trade areas as the place of occurrence of the external cause: Secondary | ICD-10-CM | POA: Insufficient documentation

## 2017-03-02 DIAGNOSIS — S0185XA Open bite of other part of head, initial encounter: Secondary | ICD-10-CM | POA: Insufficient documentation

## 2017-03-02 DIAGNOSIS — Z23 Encounter for immunization: Secondary | ICD-10-CM | POA: Insufficient documentation

## 2017-03-02 DIAGNOSIS — Z791 Long term (current) use of non-steroidal anti-inflammatories (NSAID): Secondary | ICD-10-CM | POA: Insufficient documentation

## 2017-03-02 MED ORDER — LIDOCAINE HCL (PF) 1 % IJ SOLN
5.0000 mL | Freq: Once | INTRAMUSCULAR | Status: DC
  Filled 2017-03-02: qty 5

## 2017-03-02 MED ORDER — BACITRACIN ZINC 500 UNIT/GM EX OINT
1.0000 "application " | TOPICAL_OINTMENT | Freq: Once | CUTANEOUS | Status: AC
  Administered 2017-03-02: 1 via TOPICAL
  Filled 2017-03-02: qty 0.9

## 2017-03-02 MED ORDER — BENZOCAINE 20 % MT SOLN
OROMUCOSAL | Status: AC
  Filled 2017-03-02: qty 5

## 2017-03-02 MED ORDER — HYDROCODONE-ACETAMINOPHEN 5-325 MG PO TABS: 1.0000 | ORAL_TABLET | ORAL | 0 refills | Status: DC | PRN

## 2017-03-02 MED ORDER — DOXYCYCLINE HYCLATE 100 MG PO CAPS: 100.0000 mg | ORAL_CAPSULE | Freq: Two times a day (BID) | ORAL | 0 refills | Status: DC

## 2017-03-02 MED ORDER — TETANUS-DIPHTH-ACELL PERTUSSIS 5-2.5-18.5 LF-MCG/0.5 IM SUSP
0.5000 mL | Freq: Once | INTRAMUSCULAR | Status: AC
  Administered 2017-03-02: 0.5 mL via INTRAMUSCULAR
  Filled 2017-03-02: qty 0.5

## 2017-03-02 MED ORDER — HYDROCODONE-ACETAMINOPHEN 5-325 MG PO TABS
1.0000 | ORAL_TABLET | Freq: Once | ORAL | Status: AC
  Administered 2017-03-02: 1 via ORAL
  Filled 2017-03-02: qty 1

## 2017-03-02 NOTE — ED Triage Notes (Signed)
Pt to ed with c/o dog bite to left side of face and under left eye while at work today.  Bleeding controlled.

## 2017-03-02 NOTE — ED Notes (Signed)
See triage note  states she was bitten by dog  Small laceration noted to lip,and under left eye  States this happened at work

## 2017-03-02 NOTE — ED Provider Notes (Signed)
[facility] Emergency Department Provider Note  ____________________________________________  Time seen: Approximately 5:20 PM  I have reviewed the triage vital signs and the nursing notes.   HISTORY  Chief Complaint Animal Bite   HPI Emily Hernandez is a 21 y.o. female who presents to the emergency department for evaluation and treatment of dog bite she sustained to her face. She works at a [employer]rap[employer] and one of the dog's she was working with bit her. The dog is not up-to-date with its vaccinations, but they still have the dog and will monitor it. Lacerations are present under the left eye and left side upper lip. The wounds were cleaned with chlorhexidine after she was bitten. She is unsure of her last tetanus vaccination.  Past Medical History:  Diagnosis Date  . Idiopathic intracranial hypertension     Patient Active Problem List   Diagnosis Date Noted  . Idiopathic intracranial hypertension 02/20/2014    Past Surgical History:  Procedure Laterality Date  . none      Prior to Admission medications   Medication Sig Start Date End Date Taking? Authorizing Provider  doxycycline (VIBRAMYCIN) 100 MG capsule Take 1 capsule (100 mg total) by mouth 2 (two) times daily. 03/02/17   Emily Hernandez, Emily Abraham B, FNP  HYDROcodone-acetaminophen (NORCO/VICODIN) 5-325 MG tablet Take 1 tablet by mouth every 4 (four) hours as needed for moderate pain. 03/02/17 03/02/18  Emily Hernandez, Emily Abraham B, FNP  ibuprofen (ADVIL,MOTRIN) 200 MG tablet Take 400-600 mg by mouth every 6 (six) hours as needed. For pain     [provider]  medroxyPROGESTERone (DEPO-PROVERA) 150 MG/ML injection  01/16/14   [provider]  Topiramate ER (TROKENDI XR) 100 MG CP24 Take 100 mg by mouth daily. 11/08/14   Emily Amber K, DO  Topiramate ER (TROKENDI XR) 50 MG CP24 Take 50 mg by mouth daily. 11/08/14   Emily Amber K, DO  Topiramate ER (TROKENDI XR) 50 MG CP24 Take 50 mg by mouth daily.  11/08/14   Emily Amber K, DO    Allergies Penicillins  Family History  Problem Relation Age of Onset  . GI Bleed Maternal Grandmother   . Heart attack Maternal Grandmother   . Healthy Brother     Social History Social History  Substance Use Topics  . Smoking status: Current Every Day Smoker    Packs/day: 0.25    Years: 2.00    Types: Cigarettes  . Smokeless tobacco: Former Systems developer    Quit date: 08/17/2012  . Alcohol use No    Review of Systems  Constitutional: Negative for recent illness.  Respiratory: Negative for cough or shortness of breath.  Musculoskeletal: Negative for restrictions  Skin: Positive for facial lacerations ____________________________________________   PHYSICAL EXAM:  VITAL SIGNS: ED Triage Vitals [03/02/17 1705]  Enc Vitals Group     BP      Pulse      Resp      Temp      Temp src      SpO2      Weight 163 lb (73.9 kg)     Height      Head Circumference      Peak Flow      Pain Score 8     Pain Loc      Pain Edu?      Excl. in [place]?      Constitutional: Well appearing. Eyes: Conjunctivae are clear without discharge, drainage, or suspected injury. EOMI is intact. No globe trauma.  Nose: No rhinorrhea Mouth/Throat: Airway is patent mucous membranes are intact. Neck: Full, active range of motion.  Cardiovascular: Capillary refill less than 2 seconds Respiratory: Breath sounds are clear throughout and respirations are even and unlabored.. Musculoskeletal: Full, active range of motion Neurologic: Awake, alert, and oriented 4. Skin:  3.5 cm laceration, 1.5 cm horizontal lacerations under the left eye. 1 cm vertical laceration through the vermilion border of the lip on the left side which is also through and through  ____________________________________________   LABS (all labs ordered are listed, but only abnormal results are displayed)  Labs Reviewed - No data to display ____________________________________________  EKG  Not  indicated ____________________________________________  RADIOLOGY  Not indicated ____________________________________________   PROCEDURES  Procedure(s) performed:  LACERATION REPAIR Performed by: Emily Hernandez  Consent: Verbal consent obtained.  Consent given by: patient  Prepped and Draped in normal sterile fashion  Wound explored: No foreign bodies identified.  Laceration Location: 2 lacerations under left eye and lip laceration through the Vermilion border  Laceration Length: 3.5/1.5/1cm  Anesthesia: Infraorbital block  Local anesthetic: lidocaine 1% without epinephrine  Anesthetic total: 4.5 ml  Irrigation method: syringe  Amount of cleaning: Standard with chlorhexidine and normal saline   Skin closure: 6-0 Prolene, 5-0 Vicryl   Number of sutures: 8, 2, 3+ 1 intradermal lip   Technique: Simple interrupted   Patient tolerance: Patient tolerated the procedure well with no immediate complications.  Complex suture repair of the face including realignment of the vermilion border and 2 separate complex repairs of the skin over the cheek just under the left eye.  ____________________________________________   INITIAL IMPRESSION / ASSESSMENT AND PLAN / ED COURSE  Emily Hernandez is a 21 y.o. female who presents to the emergency department for treatment and evaluation after being bitten in the face by a dog while she was at work.  ----------------------------------------- 5:32 PM on 03/02/2017 -----------------------------------------  I spoke directly with Emily Hernandez who is unaware of the patient. I let the patient know who requested that I speak with her mother. Her mother is going to try and get in touch with her friend who knows Emily Hernandez.  ----------------------------------------- 6:03 PM on 03/02/2017 -----------------------------------------  Infraorbital block completed at this time after patient requested that we go ahead and repair the  lacerations.  Wounds were cleaned and infraorbital block successfully achieved anesthesia to the left upper lip to the left lower lid. Vermilion border was carefully realigned and the lip was repaired with intradermal suture as well as external. Lacerations just under the left eye were also repaired using 6-0 nylon and frequent small sutures in order to alleviate any tension of the skin. Care was taken to realign Langer's lines as best as possible. She will be given a prescription for doxycycline as she is allergic to amoxicillin. She was warned to stay out of the sun and to make sure and coat the areas well with sunscreen even after the sutures are removed. She was strongly advised to have the sutures removed in 5 days. She was advised to return to the ER for any concerns. Tetanus was updated today as well.   Pertinent labs & imaging results that were available during my care of the patient were reviewed by me and considered in my medical decision making (see chart for details). ____________________________________________   FINAL CLINICAL IMPRESSION(S) / ED DIAGNOSES  Final diagnoses:  Dog bite of face, initial encounter  Facial laceration, initial encounter    Discharge Medication List as  of 03/02/2017  6:55 PM    START taking these medications   Details  doxycycline (VIBRAMYCIN) 100 MG capsule Take 1 capsule (100 mg total) by mouth 2 (two) times daily., Starting Tue 03/02/2017, Print    HYDROcodone-acetaminophen (NORCO/VICODIN) 5-325 MG tablet Take 1 tablet by mouth every 4 (four) hours as needed for moderate pain., Starting Tue 03/02/2017, Until Wed 03/02/2018, Print        If controlled substance prescribed during this visit, 12 month history viewed on the [facility] prior to issuing an initial prescription for Schedule II or III opiod.   Note:  This document was prepared using Dragon voice recognition software and may include unintentional dictation errors.    Victorino Dike,  FNP 03/02/17 2015    Carrie Mew, MD 03/02/17 2326

## 2017-03-02 NOTE — Discharge Instructions (Signed)
Do not get the sutured area wet for 24 hours. After 24 hours, shower/bathe as usual and pat the area dry. Change the bandage 2 times per day and apply antibiotic ointment. Leave open to air when at no risk of getting the area dirty, but cover at night before bed. Sutures should be removed in 5 days. Return for signs or concern of infection.

## 2017-03-30 DIAGNOSIS — Z3042 Encounter for surveillance of injectable contraceptive: Secondary | ICD-10-CM | POA: Diagnosis not present

## 2017-03-30 DIAGNOSIS — S30860A Insect bite (nonvenomous) of lower back and pelvis, initial encounter: Secondary | ICD-10-CM | POA: Diagnosis not present

## 2017-04-18 DIAGNOSIS — F3132 Bipolar disorder, current episode depressed, moderate: Secondary | ICD-10-CM | POA: Diagnosis not present

## 2017-05-28 DIAGNOSIS — F3132 Bipolar disorder, current episode depressed, moderate: Secondary | ICD-10-CM | POA: Diagnosis not present

## 2017-07-02 DIAGNOSIS — F3132 Bipolar disorder, current episode depressed, moderate: Secondary | ICD-10-CM | POA: Diagnosis not present

## 2017-08-06 DIAGNOSIS — F3132 Bipolar disorder, current episode depressed, moderate: Secondary | ICD-10-CM | POA: Diagnosis not present

## 2017-09-08 DIAGNOSIS — H66001 Acute suppurative otitis media without spontaneous rupture of ear drum, right ear: Secondary | ICD-10-CM | POA: Diagnosis not present

## 2017-10-20 DIAGNOSIS — H9319 Tinnitus, unspecified ear: Secondary | ICD-10-CM | POA: Diagnosis not present

## 2017-10-20 DIAGNOSIS — H9311 Tinnitus, right ear: Secondary | ICD-10-CM | POA: Diagnosis not present

## 2017-10-20 DIAGNOSIS — R51 Headache: Secondary | ICD-10-CM | POA: Diagnosis not present

## 2017-11-17 DIAGNOSIS — H4711 Papilledema associated with increased intracranial pressure: Secondary | ICD-10-CM | POA: Diagnosis not present

## 2017-11-18 ENCOUNTER — Telehealth: Payer: Self-pay | Admitting: Neurology

## 2017-11-18 NOTE — Telephone Encounter (Signed)
Santiago Glad with [facility] left a VM message regarding mutual pt and would like a call back at [phone]

## 2017-11-18 NOTE — Telephone Encounter (Signed)
Called Santiago Glad and left message for her to call me back.

## 2017-12-30 ENCOUNTER — Other Ambulatory Visit (INDEPENDENT_AMBULATORY_CARE_PROVIDER_SITE_OTHER): Payer: BLUE CROSS/BLUE SHIELD

## 2017-12-30 ENCOUNTER — Ambulatory Visit: Payer: BLUE CROSS/BLUE SHIELD | Admitting: Neurology

## 2017-12-30 ENCOUNTER — Encounter: Payer: Self-pay | Admitting: Neurology

## 2017-12-30 VITALS — BP 100/70 | HR 97 | Ht 63.0 in | Wt 198.4 lb

## 2017-12-30 DIAGNOSIS — G932 Benign intracranial hypertension: Secondary | ICD-10-CM | POA: Diagnosis not present

## 2017-12-30 LAB — COMPREHENSIVE METABOLIC PANEL
ALT: 40 U/L — ABNORMAL HIGH (ref 0–35)
AST: 28 U/L (ref 0–37)
Albumin: 4 g/dL (ref 3.5–5.2)
Alkaline Phosphatase: 53 U/L (ref 39–117)
BILIRUBIN TOTAL: 0.3 mg/dL (ref 0.2–1.2)
BUN: 14 mg/dL (ref 6–23)
CALCIUM: 9.4 mg/dL (ref 8.4–10.5)
CO2: 27 mEq/L (ref 19–32)
Chloride: 106 mEq/L (ref 96–112)
Creatinine, Ser: 0.78 mg/dL (ref 0.40–1.20)
GFR: 96.89 mL/min (ref >60.00)
Glucose, Bld: 80 mg/dL (ref 70–99)
POTASSIUM: 4 meq/L (ref 3.5–5.1)
SODIUM: 139 meq/L (ref 135–145)
TOTAL PROTEIN: 6.3 g/dL (ref 6.0–8.3)

## 2017-12-30 MED ORDER — TOPIRAMATE 25 MG PO TABS: ORAL_TABLET | ORAL | 5 refills | Status: DC

## 2017-12-30 NOTE — Progress Notes (Signed)
[facility] Clinic Note - Initial Visit   Date: 12/30/17  Emily Hernandez MRN: [id number] DOB: 09/12/1995   Dear Dr. [place]:  Thank you for your kind referral of [name] for consultation of papilledema. Although her history is well known to you, please allow [name] to reiterate it for the purpose of our medical record. The patient was accompanied to the clinic by self.  History of Present Illness: Emily Hernandez is a 22 y.o. Right Caucasian female with pseudotumor cerebri returning to the clinic for evaluation of headaches and papilledema.  She was last seen in the clinic here in March 2016 For pseudotumor cerebri at which time she was continued on topiramate 75 mg twice daily.  She stopped topiramate and depomedrol soon after her last visit.  She did not follow up with another neurologist and only recently had an eye exam which again showed papilledema.  She has not had regular eye care of the past 3 years. She had formal visual field testing, but does not know the results.  My office will request records from her eye doctor.   She continues to have headaches occurring about once per week, lasting several hours. She also have dizziness, described as lightheaded and rocking sensation, which is unchanged. Overall, she feels many of his symptoms have improved as compared tobefore because the intensity and frequency is much less.  In brief, her prior history is notable for a long history of headaches since her teen years. In June 2015 she was evaluated by her eye doctor who diagnosed her with bilateral papilledema, normal visual fields.  She had extensive neurological evaluation including MRI/the brain which was normal and a lumbar puncture with opening pressure of 24 mmHg.  After starting topiramate, her headaches improved, but she stopped this in 2016.  Out-side paper records, electronic medical record, and images have been reviewed where available and  summarized as:  CSF 02/07/2014:  R0 W1 G59 P28    ACE 1, MBP < 2.0, no OCB, HSV neg, cytology negative, cytometry (insufficient sample)  MRI/V brain wwo contrast 02/07/2014: No evidence of venous thrombosis.  Normal appearance of the brain parenchyma. 5 x 7 mm pineal cyst. These are seen in up to 5% of normal examinations. Usually, these are incidental and insignificant, especially when this small and not associated with any complicating features. In certain atypical cases, they can be associated with headache.  Past Medical History:  Diagnosis Date  . Idiopathic intracranial hypertension     Past Surgical History:  Procedure Laterality Date  . none       Medications:  Outpatient Encounter Medications as of 12/30/2017  Medication Sig Note  . ibuprofen (ADVIL,MOTRIN) 200 MG tablet Take 400-600 mg by mouth every 6 (six) hours as needed. For pain    . [DISCONTINUED] doxycycline (VIBRAMYCIN) 100 MG capsule Take 1 capsule (100 mg total) by mouth 2 (two) times daily.   . [DISCONTINUED] HYDROcodone-acetaminophen (NORCO/VICODIN) 5-325 MG tablet Take 1 tablet by mouth every 4 (four) hours as needed for moderate pain.   . [DISCONTINUED] medroxyPROGESTERone (DEPO-PROVERA) 150 MG/ML injection  01/30/2014: Received from: External Pharmacy  . [DISCONTINUED] Topiramate ER (TROKENDI XR) 100 MG CP24 Take 100 mg by mouth daily.   . [DISCONTINUED] Topiramate ER (TROKENDI XR) 50 MG CP24 Take 50 mg by mouth daily.   . [DISCONTINUED] Topiramate ER (TROKENDI XR) 50 MG CP24 Take 50 mg by mouth daily.    No facility-administered encounter medications on file as  of 12/30/2017.      Allergies:  Allergies  Allergen Reactions  . Penicillins     Swelling and Hives    Family History: Family History  Problem Relation Age of Onset  . GI Bleed Maternal Grandmother   . Heart attack Maternal Grandmother   . Healthy Brother     Social History: Social History   Tobacco Use  . Smoking status: Current Every  Day Smoker    Packs/day: 0.25    Years: 2.00    Pack years: 0.50    Types: Cigarettes  . Smokeless tobacco: Former Systems developer    Quit date: 08/17/2012  Substance Use Topics  . Alcohol use: No  . Drug use: No   Social History   Social History Narrative   She is currently a Educational psychologist at [employer] level of education:  Some college   Lives with mother and stepfather.    Review of Systems:  CONSTITUTIONAL: No fevers, chills, night sweats, or weight loss.   EYES: +visual changes or eye pain ENT: No hearing changes.  No history of nose bleeds.   RESPIRATORY: No cough, wheezing and shortness of breath.   CARDIOVASCULAR: Negative for chest pain, and palpitations.   GI: Negative for abdominal discomfort, blood in stools or black stools.  No recent change in bowel habits.   GU:  No history of incontinence.   MUSCLOSKELETAL: No history of joint pain or swelling.  No myalgias.   SKIN: Negative for lesions, rash, and itching.   HEMATOLOGY/ONCOLOGY: Negative for prolonged bleeding, bruising easily, and swollen nodes.  No history of cancer.   ENDOCRINE: Negative for cold or heat intolerance, polydipsia or goiter.   PSYCH:  No depression or anxiety symptoms.   NEURO: As Above.   Vital Signs:  BP 100/70   Pulse 97   Ht 5\' 3"  (1.6 m)   Wt 198 lb 6 oz (90 kg)   SpO2 98%   BMI 35.14 kg/m    General Medical Exam:   General:  Well appearing, comfortable.   Eyes/ENT: see cranial nerve examination.   Neck: No masses appreciated.  Full range of motion without tenderness.  No carotid bruits. Respiratory:  Clear to auscultation, good air entry bilaterally.   Cardiac:  Regular rate and rhythm, no murmur.   Extremities:  No deformities, edema, or skin discoloration.  Skin:  No rashes or lesions.  Neurological Exam: MENTAL STATUS including orientation to time, place, person, recent and remote memory, attention span and concentration, language, and fund of knowledge is normal.  Speech is  not dysarthric.  CRANIAL NERVES: II:  No visual field defects.  Mild papilledema bilaterally..   III-IV-VI: Pupils equal round and reactive to light.  Normal conjugate, extra-ocular eye movements in all directions of gaze.  No nystagmus.  No ptosis.   V:  Normal facial sensation.  VII:  Normal facial symmetry and movements.   VIII:  Normal hearing and vestibular function.   IX-X:  Normal palatal movement.   XI:  Normal shoulder shrug and head rotation.   XII:  Normal tongue strength and range of motion, no deviation or fasciculation.  MOTOR: motor strength is 5/5 throughout. No atrophy, fasciculations or abnormal movements.  No pronator drift.  Tone is normal.    MSRs:  Right  Left brachioradialis 2+  brachioradialis 2+  biceps 2+  biceps 2+  triceps 2+  triceps 2+  patellar 2+  patellar 2+  ankle jerk 2+  ankle jerk 2+  Hoffman no  Hoffman no  plantar response down  plantar response down   SENSORY:  Normal and symmetric perception of light touch, pinprick, vibration, and proprioception.   COORDINATION/GAIT: Normal finger-to- nose-finger.  Intact rapid alternating movements bilaterally. Gait narrow based and stable. Tandem and stressed gait intact.    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 Headaches and vision changes were well-controlled on topiramate 75 mg twice daily, which she self discontinued in 2016. She recently underwent eye evaluation, which shows stable papilledema and referred for further evaluation. With her history of migraines and chronic headaches, I will restart topiramate 25 mg daily 2 weeks, then increase to 1 tablet twice a day. If she develops any worsening papilledema,we may consider switching to acetazolamide.  Check CMP. Recommend repeat eye evaluation in 2 months and follow up with me in the clinic in 4 months.  Thank you for  allowing me to participate in patient's care.  If I can answer any additional questions, I would be pleased to do so.    Sincerely,    Daizy Outen K. Posey Pronto, DO

## 2017-12-30 NOTE — Patient Instructions (Addendum)
Start topiramate 25mg  1 tablet daily x 2 weeks, then increase to 1 tablet twice daily  Check CMP  Please have your eyes rechecked in 2 months  Return to clinic in 4 months

## 2017-12-31 ENCOUNTER — Telehealth: Payer: Self-pay | Admitting: *Deleted

## 2017-12-31 ENCOUNTER — Other Ambulatory Visit: Payer: Self-pay | Admitting: *Deleted

## 2017-12-31 ENCOUNTER — Ambulatory Visit: Payer: Self-pay | Admitting: Podiatry

## 2017-12-31 DIAGNOSIS — R748 Abnormal levels of other serum enzymes: Secondary | ICD-10-CM

## 2017-12-31 NOTE — Telephone Encounter (Signed)
Left message giving patient results and instructions.  Order placed for lab work in July.

## 2017-12-31 NOTE — Telephone Encounter (Signed)
-----   Message from Alda Berthold, DO sent at 12/31/2017  8:01 AM EDT ----- Please inform pt that her liver enzymes are mildly elevated.  Encourage her to limit tylenol and alcohol use, if using.  Recheck CMP in 2 months.

## 2018-01-14 ENCOUNTER — Ambulatory Visit: Payer: BLUE CROSS/BLUE SHIELD | Admitting: Podiatry

## 2018-01-14 ENCOUNTER — Encounter: Payer: Self-pay | Admitting: Podiatry

## 2018-01-14 DIAGNOSIS — B353 Tinea pedis: Secondary | ICD-10-CM

## 2018-01-14 MED ORDER — CLOTRIMAZOLE-BETAMETHASONE 1-0.05 % EX CREA: 1.0000 "application " | TOPICAL_CREAM | Freq: Two times a day (BID) | CUTANEOUS | 1 refills | Status: DC

## 2018-01-14 MED ORDER — TERBINAFINE HCL 250 MG PO TABS: 250.0000 mg | ORAL_TABLET | Freq: Every day | ORAL | 0 refills | Status: DC

## 2018-01-17 ENCOUNTER — Telehealth: Payer: Self-pay | Admitting: Podiatry

## 2018-01-17 NOTE — Telephone Encounter (Signed)
Patient called and stated [facility] [place] [address] of [address] and it was suppose to be sent to the one on [address] in [place]. I did go ahead and put the correct pharmacy in there if you can just send it to there instead. Please give patient a call back to let her know you sent it at [phone]

## 2018-01-17 NOTE — Progress Notes (Signed)
   HPI: 22 year old female presenting today as a new patient with a chief complaint of intermittent itching and stinging to the interdigital spaces of the toes of bilateral feet that began 2-3 months ago. She has used OTC athlete's feet spray for treatment with no significant relief. There are no modifying factors noted. Patient is here for further evaluation and treatment.   Past Medical History:  Diagnosis Date  . Idiopathic intracranial hypertension      Physical Exam: General: The patient is alert and oriented x3 in no acute distress.  Dermatology: Pruritus to the interdigital areas to bilateral feet with hyperkeratosis. Skin is warm, dry and supple bilateral lower extremities. Negative for open lesions or macerations.  Vascular: Palpable pedal pulses bilaterally. No edema or erythema noted. Capillary refill within normal limits.  Neurological: Epicritic and protective threshold grossly intact bilaterally.   Musculoskeletal Exam: Range of motion within normal limits to all pedal and ankle joints bilateral. Muscle strength 5/5 in all groups bilateral.   Assessment: 1. Tinea pedis bilateral   Plan of Care:  1. Patient evaluated. 2. Prescription for Lamisil 250 mg #28 provided to patient.  3. Prescription for Lotrisone cream provided to patient.  4. Return to clinic as needed.       Edrick Kins, DPM [facility]  Dr. Edrick Kins, DPM    [address], [place] [address]                Office [phone]  Fax [phone]

## 2018-01-19 MED ORDER — TERBINAFINE HCL 250 MG PO TABS: 250.0000 mg | ORAL_TABLET | Freq: Every day | ORAL | 0 refills | Status: DC

## 2018-01-19 NOTE — Telephone Encounter (Signed)
[facility] [address] and patient has been notified via voice mail

## 2018-02-25 ENCOUNTER — Other Ambulatory Visit (INDEPENDENT_AMBULATORY_CARE_PROVIDER_SITE_OTHER): Payer: BLUE CROSS/BLUE SHIELD

## 2018-02-25 ENCOUNTER — Ambulatory Visit (INDEPENDENT_AMBULATORY_CARE_PROVIDER_SITE_OTHER): Payer: BLUE CROSS/BLUE SHIELD | Admitting: Certified Nurse Midwife

## 2018-02-25 ENCOUNTER — Encounter: Payer: Self-pay | Admitting: Certified Nurse Midwife

## 2018-02-25 ENCOUNTER — Other Ambulatory Visit: Payer: Self-pay | Admitting: Certified Nurse Midwife

## 2018-02-25 VITALS — BP 127/69 | HR 82 | Ht 63.0 in | Wt 200.4 lb

## 2018-02-25 DIAGNOSIS — R109 Unspecified abdominal pain: Secondary | ICD-10-CM

## 2018-02-25 DIAGNOSIS — O3411 Maternal care for benign tumor of corpus uteri, first trimester: Secondary | ICD-10-CM

## 2018-02-25 DIAGNOSIS — Z3A01 Less than 8 weeks gestation of pregnancy: Secondary | ICD-10-CM | POA: Diagnosis not present

## 2018-02-25 DIAGNOSIS — N926 Irregular menstruation, unspecified: Secondary | ICD-10-CM

## 2018-02-25 DIAGNOSIS — O26899 Other specified pregnancy related conditions, unspecified trimester: Secondary | ICD-10-CM | POA: Diagnosis not present

## 2018-02-25 DIAGNOSIS — N8311 Corpus luteum cyst of right ovary: Secondary | ICD-10-CM | POA: Diagnosis not present

## 2018-02-25 NOTE — Progress Notes (Signed)
New pt is here for a confirmation of pregnancy.

## 2018-02-25 NOTE — Patient Instructions (Signed)
Back Pain in Pregnancy Back pain during pregnancy is common. Back pain may be caused by several factors that are related to changes during your pregnancy. Follow these instructions at home: Managing pain, stiffness, and swelling  If directed, apply ice for sudden (acute) back pain. ? Put ice in a plastic bag. ? Place a towel between your skin and the bag. ? Leave the ice on for 20 minutes, 2-3 times per day.  If directed, apply heat to the affected area before you exercise: ? Place a towel between your skin and the heat pack or heating pad. ? Leave the heat on for 20-30 minutes. ? Remove the heat if your skin turns bright red. This is especially important if you are unable to feel pain, heat, or cold. You may have a greater risk of getting burned. Activity  Exercise as told by your health care provider. Exercising is the best way to prevent or manage back pain.  Listen to your body when lifting. If lifting hurts, ask for help or bend your knees. This uses your leg muscles instead of your back muscles.  Squat down when picking up something from the floor. Do not bend over.  Only use bed rest as told by your health care provider. Bed rest should only be used for the most severe episodes of back pain. Standing, Sitting, and Lying Down  Do not stand in one place for long periods of time.  Use good posture when sitting. Make sure your head rests over your shoulders and is not hanging forward. Use a pillow on your lower back if necessary.  Try sleeping on your side, preferably the left side, with a pillow or two between your legs. If you are sore after a night's rest, your bed may be too soft. A firm mattress may provide more support for your back during pregnancy. General instructions  Do not wear high heels.  Eat a healthy diet. Try to gain weight within your health care provider's recommendations.  Use a maternity girdle, elastic sling, or back brace as told by your health care  provider.  Take over-the-counter and prescription medicines only as told by your health care provider.  Keep all follow-up visits as told by your health care provider. This is important. This includes any visits with any specialists, such as a physical therapist. Contact a health care provider if:  Your back pain interferes with your daily activities.  You have increasing pain in other parts of your body. Get help right away if:  You develop numbness, tingling, weakness, or problems with the use of your arms or legs.  You develop severe back pain that is not controlled with medicine.  You have a sudden change in bowel or bladder control.  You develop shortness of breath, dizziness, or you faint.  You develop nausea, vomiting, or sweating.  You have back pain that is a rhythmic, cramping pain similar to labor pains. Labor pain is usually 1-2 minutes apart, lasts for about 1 minute, and involves a bearing down feeling or pressure in your pelvis.  You have back pain and your water breaks or you have vaginal bleeding.  You have back pain or numbness that travels down your leg.  Your back pain developed after you fell.  You develop pain on one side of your back.  You see blood in your urine.  You develop skin blisters in the area of your back pain. This information is not intended to replace advice given to you   by your health care provider. Make sure you discuss any questions you have with your health care provider. Document Released: 11/11/2005 Document Revised: 01/09/2016 Document Reviewed: 04/17/2015 Elsevier Interactive Patient Education  2018 [place]. Abdominal Pain During Pregnancy Belly (abdominal) pain is common during pregnancy. Most of the time, it is not a serious problem. Other times, it can be a sign that something is wrong with the pregnancy. Always tell your doctor if you have belly pain. Follow these instructions at home: Monitor your belly pain for any  changes. The following actions may help you feel better:  Do not have sex (intercourse) or put anything in your vagina until you feel better.  Rest until your pain stops.  Drink clear fluids if you feel sick to your stomach (nauseous). Do not eat solid food until you feel better.  Only take medicine as told by your doctor.  Keep all doctor visits as told.  Get help right away if:  You are bleeding, leaking fluid, or pieces of tissue come out of your vagina.  You have more pain or cramping.  You keep throwing up (vomiting).  You have pain when you pee (urinate) or have blood in your pee.  You have a fever.  You do not feel your baby moving as much.  You feel very weak or feel like passing out.  You have trouble breathing, with or without belly pain.  You have a very bad headache and belly pain.  You have fluid leaking from your vagina and belly pain.  You keep having watery poop (diarrhea).  Your belly pain does not go away after resting, or the pain gets worse. This information is not intended to replace advice given to you by your health care provider. Make sure you discuss any questions you have with your health care provider. Document Released: 07/22/2009 Document Revised: 03/11/2016 Document Reviewed: 03/02/2013 Elsevier Interactive Patient Education  2018 [employer]. Vaginal Bleeding During Pregnancy, First Trimester A small amount of bleeding (spotting) from the vagina is common in early pregnancy. Sometimes the bleeding is normal and is not a problem, and sometimes it is a sign of something serious. Be sure to tell your doctor about any bleeding from your vagina right away. Follow these instructions at home:  Watch your condition for any changes.  Follow your doctor's instructions about how active you can be.  If you are on bed rest: ? You may need to stay in bed and only get up to use the bathroom. ? You may be allowed to do some activities. ? If you need  help, make plans for someone to help you.  Write down: ? The number of pads you use each day. ? How often you change pads. ? How soaked (saturated) your pads are.  Do not use tampons.  Do not douche.  Do not have sex or orgasms until your doctor says it is okay.  If you pass any tissue from your vagina, save the tissue so you can show it to your doctor.  Only take medicines as told by your doctor.  Do not take aspirin because it can make you bleed.  Keep all follow-up visits as told by your doctor. Contact a doctor if:  You bleed from your vagina.  You have cramps.  You have labor pains.  You have a fever that does not go away after you take medicine. Get help right away if:  You have very bad cramps in your back or belly (abdomen).  You pass large clots or tissue from your vagina.  You bleed more.  You feel light-headed or weak.  You pass out (faint).  You have chills.  You are leaking fluid or have a gush of fluid from your vagina.  You pass out while pooping (having a bowel movement). This information is not intended to replace advice given to you by your health care provider. Make sure you discuss any questions you have with your health care provider. Document Released: 12/18/2013 Document Revised: 01/09/2016 Document Reviewed: 04/10/2013 Elsevier Interactive Patient Education  2018 [place] WHAT OB PATIENTS CAN EXPECT   Confirmation of pregnancy and ultrasound ordered if medically indicated-[redacted] weeks gestation  New OB (NOB) intake with nurse and New OB (NOB) labs- [redacted] weeks gestation  New OB (NOB) physical examination with provider- 11/[redacted] weeks gestation  Flu vaccine-[redacted] weeks gestation  Anatomy scan-[redacted] weeks gestation  Glucose tolerance test, blood work to test for anemia, T-dap vaccine-[redacted] weeks gestation  Vaginal swabs/cultures-STD/Group B strep-[redacted] weeks gestation  Appointments every 4 weeks until 28 weeks  Every 2 weeks from 28 weeks until  36 weeks  Weekly visits from 36 weeks until delivery  Eating Plan for Pregnant Women While you are pregnant, your body will require additional nutrition to help support your growing baby. It is recommended that you consume:  150 additional calories each day during your first trimester.  300 additional calories each day during your second trimester.  300 additional calories each day during your third trimester.  Eating a healthy, well-balanced diet is very important for your health and for your baby's health. You also have a higher need for some vitamins and minerals, such as folic acid, calcium, iron, and vitamin D. What do I need to know about eating during pregnancy?  Do not try to lose weight or go on a diet during pregnancy.  Choose healthy, nutritious foods. Choose  of a sandwich with a glass of milk instead of a candy bar or a high-calorie sugar-sweetened beverage.  Limit your overall intake of foods that have "empty calories." These are foods that have little nutritional value, such as sweets, desserts, candies, sugar-sweetened beverages, and fried foods.  Eat a variety of foods, especially fruits and vegetables.  Take a prenatal vitamin to help meet the additional needs during pregnancy, specifically for folic acid, iron, calcium, and vitamin D.  Remember to stay active. Ask your health care provider for exercise recommendations that are specific to you.  Practice good food safety and cleanliness, such as washing your hands before you eat and after you prepare raw meat. This helps to prevent foodborne illnesses, such as listeriosis, that can be very dangerous for your baby. Ask your health care provider for more information about listeriosis. What does 150 extra calories look like? Healthy options for an additional 150 calories each day could be any of the following:  Plain low-fat yogurt (6-8 oz) with  cup of berries.  1 apple with 2 teaspoons of peanut butter.  Cut-up  vegetables with  cup of hummus.  Low-fat chocolate milk (8 oz or 1 cup).  1 string cheese with 1 medium orange.   of a peanut butter and jelly sandwich on whole-wheat bread (1 tsp of peanut butter).  For 300 calories, you could eat two of those healthy options each day. What is a healthy amount of weight to gain? The recommended amount of weight for you to gain is based on your pre-pregnancy BMI. If your pre-pregnancy BMI was:  Less than 18 (underweight), you should gain 28-40 lb.  18-24.9 (normal), you should gain 25-35 lb.  25-29.9 (overweight), you should gain 15-25 lb.  Greater than 30 (obese), you should gain 11-20 lb.  What if I am having twins or multiples? Generally, pregnant women who will be having twins or multiples may need to increase their daily calories by 300-600 calories each day. The recommended range for total weight gain is 25-54 lb, depending on your pre-pregnancy BMI. Talk with your health care provider for specific guidance about additional nutritional needs, weight gain, and exercise during your pregnancy. What foods can I eat? Grains Any grains. Try to choose whole grains, such as whole-wheat bread, oatmeal, or brown rice. Vegetables Any vegetables. Try to eat a variety of colors and types of vegetables to get a full range of vitamins and minerals. Remember to wash your vegetables well before eating. Fruits Any fruits. Try to eat a variety of colors and types of fruit to get a full range of vitamins and minerals. Remember to wash your fruits well before eating. Meats and Other Protein Sources Lean meats, including chicken, [place], fish, and lean cuts of beef, veal, or pork. Make sure that all meats are cooked to "well done." Tofu. Tempeh. Beans. Eggs. Peanut butter and other nut butters. Seafood, such as shrimp, crab, and lobster. If you choose fish, select types that are higher in omega-3 fatty acids, including salmon, herring, mussels, trout, sardines, and  pollock. Make sure that all meats are cooked to food-safe temperatures. Dairy Pasteurized milk and milk alternatives. Pasteurized yogurt and pasteurized cheese. Cottage cheese. Sour cream. Beverages Water. Juices that contain 100% fruit juice or vegetable juice. Caffeine-free teas and decaffeinated coffee. Drinks that contain caffeine are okay to drink, but it is better to avoid caffeine. Keep your total caffeine intake to less than 200 mg each day (12 oz of coffee, tea, or soda) or as directed by your health care provider. Condiments Any pasteurized condiments. Sweets and Desserts Any sweets and desserts. Fats and Oils Any fats and oils. The items listed above may not be a complete list of recommended foods or beverages. Contact your dietitian for more options. What foods are not recommended? Vegetables Unpasteurized (raw) vegetable juices. Fruits Unpasteurized (raw) fruit juices. Meats and Other Protein Sources Cured meats that have nitrates, such as bacon, salami, and hotdogs. Luncheon meats, bologna, or other deli meats (unless they are reheated until they are steaming hot). Refrigerated pate, meat spreads from a meat counter, smoked seafood that is found in the refrigerated section of a store. Raw fish, such as sushi or sashimi. High mercury content fish, such as tilefish, shark, swordfish, and king mackerel. Raw meats, such as tuna or beef tartare. Undercooked meats and poultry. Make sure that all meats are cooked to food-safe temperatures. Dairy Unpasteurized (raw) milk and any foods that have raw milk in them. Soft cheeses, such as feta, queso blanco, queso fresco, Brie, Camembert cheeses, blue-veined cheeses, and Panela cheese (unless it is made with pasteurized milk, which must be stated on the label). Beverages Alcohol. Sugar-sweetened beverages, such as sodas, teas, or energy drinks. Condiments Homemade fermented foods and drinks, such as pickles, sauerkraut, or kombucha drinks.  (Store-bought pasteurized versions of these are okay.) Other Salads that are made in the store, such as ham salad, chicken salad, egg salad, tuna salad, and seafood salad. The items listed above may not be a complete list of foods and beverages to avoid. Contact your dietitian for more  information. This information is not intended to replace advice given to you by your health care provider. Make sure you discuss any questions you have with your health care provider. Document Released: 05/18/2014 Document Revised: 01/09/2016 Document Reviewed: 01/16/2014 Elsevier Interactive Patient Education  2018 [employer]. Morning Sickness Morning sickness is when you feel sick to your stomach (nauseous) during pregnancy. You may feel sick to your stomach and throw up (vomit). You may feel sick in the morning, but you can feel this way any time of day. Some women feel very sick to their stomach and cannot stop throwing up (hyperemesis gravidarum). Follow these instructions at home:  Only take medicines as told by your doctor.  Take multivitamins as told by your doctor. Taking multivitamins before getting pregnant can stop or lessen the harshness of morning sickness.  Eat dry toast or unsalted crackers before getting out of bed.  Eat 5 to 6 small meals a day.  Eat dry and bland foods like rice and baked potatoes.  Do not drink liquids with meals. Drink between meals.  Do not eat greasy, fatty, or spicy foods.  Have someone cook for you if the smell of food causes you to feel sick or throw up.  If you feel sick to your stomach after taking prenatal vitamins, take them at night or with a snack.  Eat protein when you need a snack (nuts, yogurt, cheese).  Eat unsweetened gelatins for dessert.  Wear a bracelet used for sea sickness (acupressure wristband).  Go to a doctor that puts thin needles into certain body points (acupuncture) to improve how you feel.  Do not smoke.  Use a humidifier to  keep the air in your house free of odors.  Get lots of fresh air. Contact a doctor if:  You need medicine to feel better.  You feel dizzy or lightheaded.  You are losing weight. Get help right away if:  You feel very sick to your stomach and cannot stop throwing up.  You pass out (faint). This information is not intended to replace advice given to you by your health care provider. Make sure you discuss any questions you have with your health care provider. Document Released: 09/10/2004 Document Revised: 01/09/2016 Document Reviewed: 01/18/2013 Elsevier Interactive Patient Education  2017 [place]. Common Medications Safe in Pregnancy  Acne:      Constipation:  Benzoyl Peroxide     Colace  Clindamycin      Dulcolax Suppository  Topica Erythromycin     Fibercon  Salicylic Acid      Metamucil         Miralax AVOID:        Senakot   Accutane    Cough:  Retin-A       Cough Drops  Tetracycline      Phenergan w/ Codeine if Rx  Minocycline      Robitussin (Plain & DM)  Antibiotics:     Crabs/Lice:  Ceclor       RID  Cephalosporins    AVOID:  E-Mycins      Kwell  Keflex  Macrobid/Macrodantin   Diarrhea:  Penicillin      Kao-Pectate  Zithromax      Imodium AD         PUSH FLUIDS AVOID:       Cipro     Fever:  Tetracycline      Tylenol (Regular or Extra  Minocycline       Strength)  Levaquin  Extra Strength-Do not          Exceed 8 tabs/24 hrs Caffeine:        <250m/day (equiv. To 1 cup of coffee or  approx. 3 12 oz sodas)         Gas: Cold/Hayfever:       Gas-X  Benadryl      Mylicon  Claritin       Phazyme  **Claritin-D        Chlor-Trimeton    Headaches:  Dimetapp      ASA-Free Excedrin  Drixoral-Non-Drowsy     Cold Compress  Mucinex (Guaifenasin)     Tylenol (Regular or Extra  Sudafed/Sudafed-12 Hour     Strength)  **Sudafed PE Pseudoephedrine   Tylenol Cold & Sinus     Vicks Vapor Rub  Zyrtec  **AVOID if Problems With Blood  Pressure         Heartburn: Avoid lying down for at least 1 hour after meals  Aciphex      Maalox     Rash:  Milk of Magnesia     Benadryl    Mylanta       1% Hydrocortisone Cream  Pepcid  Pepcid Complete   Sleep Aids:  Prevacid      Ambien   Prilosec       Benadryl  Rolaids       Chamomile Tea  Tums (Limit 4/day)     Unisom  Zantac       Tylenol PM         Warm milk-add vanilla or  Hemorrhoids:       Sugar for taste  Anusol/Anusol H.C.  (RX: Analapram 2.5%)  Sugar Substitutes:  Hydrocortisone OTC     Ok in moderation  Preparation H      Tucks        Vaseline lotion applied to tissue with wiping    Herpes:     Throat:  Acyclovir      Oragel  Famvir  Valtrex     Vaccines:         Flu Shot Leg Cramps:       *Gardasil  Benadryl      Hepatitis A         Hepatitis B Nasal Spray:       Pneumovax  Saline Nasal Spray     Polio Booster         Tetanus Nausea:       Tuberculosis test or PPD  Vitamin B6 25 mg TID   AVOID:    Dramamine      *Gardasil  Emetrol       Live Poliovirus  Ginger Root 250 mg QID    MMR (measles, mumps &  High Complex Carbs @ Bedtime    rebella)  Sea Bands-Accupressure    Varicella (Chickenpox)  Unisom 1/2 tab TID     *No known complications           If received before Pain:         Known pregnancy;   Darvocet       Resume series after  Lortab        Delivery  Percocet    Yeast:   Tramadol      Femstat  Tylenol 3      Gyne-lotrimin  Ultram       Monistat  Vicodin           MISC:  All Sunscreens           Hair Coloring/highlights          Insect Repellant's          (Including DEET)         Mystic Tans First Trimester of Pregnancy The first trimester of pregnancy is from week 1 until the end of week 13 (months 1 through 3). During this time, your baby will begin to develop inside you. At 6-8 weeks, the eyes and face are formed, and the heartbeat can be seen on ultrasound. At the end of 12 weeks, all the baby's organs are formed.  Prenatal care is all the medical care you receive before the birth of your baby. Make sure you get good prenatal care and follow all of your doctor's instructions. Follow these instructions at home: Medicines  Take over-the-counter and prescription medicines only as told by your doctor. Some medicines are safe and some medicines are not safe during pregnancy.  Take a prenatal vitamin that contains at least 600 micrograms (mcg) of folic acid.  If you have trouble pooping (constipation), take medicine that will make your stool soft (stool softener) if your doctor approves. Eating and drinking  Eat regular, healthy meals.  Your doctor will tell you the amount of weight gain that is right for you.  Avoid raw meat and uncooked cheese.  If you feel sick to your stomach (nauseous) or throw up (vomit): ? Eat 4 or 5 small meals a day instead of 3 large meals. ? Try eating a few soda crackers. ? Drink liquids between meals instead of during meals.  To prevent constipation: ? Eat foods that are high in fiber, like fresh fruits and vegetables, whole grains, and beans. ? Drink enough fluids to keep your pee (urine) clear or pale yellow. Activity  Exercise only as told by your doctor. Stop exercising if you have cramps or pain in your lower belly (abdomen) or low back.  Do not exercise if it is too hot, too humid, or if you are in a place of great height (high altitude).  Try to avoid standing for long periods of time. Move your legs often if you must stand in one place for a long time.  Avoid heavy lifting.  Wear low-heeled shoes. Sit and stand up straight.  You can have sex unless your doctor tells you not to. Relieving pain and discomfort  Wear a good support bra if your breasts are sore.  Take warm water baths (sitz baths) to soothe pain or discomfort caused by hemorrhoids. Use hemorrhoid cream if your doctor says it is okay.  Rest with your legs raised if you have leg cramps or low  back pain.  If you have puffy, bulging veins (varicose veins) in your legs: ? Wear support hose or compression stockings as told by your doctor. ? Raise (elevate) your feet for 15 minutes, 3-4 times a day. ? Limit salt in your food. Prenatal care  Schedule your prenatal visits by the twelfth week of pregnancy.  Write down your questions. Take them to your prenatal visits.  Keep all your prenatal visits as told by your doctor. This is important. Safety  Wear your seat belt at all times when driving.  Make a list of emergency phone numbers. The list should include numbers for family, friends, the hospital, and police and fire departments. General instructions  Ask your doctor for a referral to a local prenatal class. Begin classes no later than  at the start of month 6 of your pregnancy.  Ask for help if you need counseling or if you need help with nutrition. Your doctor can give you advice or tell you where to go for help.  Do not use hot tubs, steam rooms, or saunas.  Do not douche or use tampons or scented sanitary pads.  Do not cross your legs for long periods of time.  Avoid all herbs and alcohol. Avoid drugs that are not approved by your doctor.  Do not use any tobacco products, including cigarettes, chewing tobacco, and electronic cigarettes. If you need help quitting, ask your doctor. You may get counseling or other support to help you quit.  Avoid cat litter boxes and soil used by cats. These carry germs that can cause birth defects in the baby and can cause a loss of your baby (miscarriage) or stillbirth.  Visit your dentist. At home, brush your teeth with a soft toothbrush. Be gentle when you floss. Contact a doctor if:  You are dizzy.  You have mild cramps or pressure in your lower belly.  You have a nagging pain in your belly area.  You continue to feel sick to your stomach, you throw up, or you have watery poop (diarrhea).  You have a bad smelling fluid  coming from your vagina.  You have pain when you pee (urinate).  You have increased puffiness (swelling) in your face, hands, legs, or ankles. Get help right away if:  You have a fever.  You are leaking fluid from your vagina.  You have spotting or bleeding from your vagina.  You have very bad belly cramping or pain.  You gain or lose weight rapidly.  You throw up blood. It may look like coffee grounds.  You are around people who have [name] measles, fifth disease, or chickenpox.  You have a very bad headache.  You have shortness of breath.  You have any kind of trauma, such as from a fall or a car accident. Summary  The first trimester of pregnancy is from week 1 until the end of week 13 (months 1 through 3).  To take care of yourself and your unborn baby, you will need to eat healthy meals, take medicines only if your doctor tells you to do so, and do activities that are safe for you and your baby.  Keep all follow-up visits as told by your doctor. This is important as your doctor will have to ensure that your baby is healthy and growing well. This information is not intended to replace advice given to you by your health care provider. Make sure you discuss any questions you have with your health care provider. Document Released: 01/20/2008 Document Revised: 08/11/2016 Document Reviewed: 08/11/2016 Elsevier Interactive Patient Education  2017 [employer].

## 2018-02-25 NOTE — Progress Notes (Signed)
GYN ENCOUNTER NOTE  Subjective:       Emily Hernandez is a 22 y.o. G1P0 female here for pregnancy confirmation.   Reports positive home pregnancy test x 1 on Monday. Endorses breast tenderness and lower abdominal cramping.   This pregnancy was unplanned and not prevented. She is not taking a prenatal vitamin.   Denies difficulty breathing or respiratory distress, chest pain, vaginal bleeding, dysuria, and leg pain or swelling.    Gynecologic History  Patient's last menstrual period was 01/03/2018 (approximate).   Gestational age: 25 weeks 4 days   Estimated date of birth: 10/10/2020  Contraception: none  Obstetric History  OB History  Gravida Para Term Preterm AB Living  1            SAB TAB Ectopic Multiple Live Births               # Outcome Date GA Lbr Len/2nd Weight Sex Delivery Anes PTL Lv  1 Gravida             Past Medical History:  Diagnosis Date  . Idiopathic intracranial hypertension     Past Surgical History:  Procedure Laterality Date  . none      No current outpatient medications on file prior to visit.   No current facility-administered medications on file prior to visit.     Allergies  Allergen Reactions  . Penicillins     Swelling and Hives    Social History   Socioeconomic History  . Marital status: Single    Spouse name: Not on file  . Number of children: 0  . Years of education: Not on file  . Highest education level: Not on file  Occupational History  . Occupation: Psychologist, prison and probation services Needs  . Financial resource strain: Not on file  . Food insecurity:    Worry: Not on file    Inability: Not on file  . Transportation needs:    Medical: Not on file    Non-medical: Not on file  Tobacco Use  . Smoking status: Former Smoker    Packs/day: 0.25    Years: 2.00    Pack years: 0.50    Types: Cigarettes  . Smokeless tobacco: Former Systems developer    Quit date: 08/17/2012  Substance and Sexual Activity  . Alcohol use: No  . Drug use: No  .  Sexual activity: Yes    Birth control/protection: None  Lifestyle  . Physical activity:    Days per week: Not on file    Minutes per session: Not on file  . Stress: Not on file  Relationships  . Social connections:    Talks on phone: Not on file    Gets together: Not on file    Attends religious service: Not on file    Active member of club or organization: Not on file    Attends meetings of clubs or organizations: Not on file    Relationship status: Not on file  . Intimate partner violence:    Fear of current or ex partner: Not on file    Emotionally abused: Not on file    Physically abused: Not on file    Forced sexual activity: Not on file  Other Topics Concern  . 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    Family History  Problem Relation Age of  Onset  . GI Bleed Maternal Grandmother   . Heart attack Maternal Grandmother   . Healthy Brother     The following portions of the patient's history were reviewed and updated as appropriate: allergies, current medications, past family history, past medical history, past social history, past surgical history and problem list.  Review of Systems  ROS negative except as noted above. Information obtained from patient.   Objective:   BP 127/69   Pulse 82   Ht 5\' 3"  (1.6 m)   Wt 200 lb 7 oz (90.9 kg)   LMP 01/03/2018 (Approximate)   BMI 35.51 kg/m   GENERAL: Alert and oriented x 4, no apparent distress  Positive UPT  ULTRASOUND REPORT  Location: [facility] Date of Service:  02/25/2018  Indications: Dating/Viability Findings:  A single gestational sac is identified within the fundus of the uterus.  It measures approximately 5 2/[redacted] weeks gestation. A clinical EDD has not been established due to irregular menses, however, today's scan does not agree with patient stated LMP of  01/03/18.  A normal appearing yolk sac was identified.  A fetal pole was not identified at this time.  ? Early pregnancy.  Right Ovary measures 4.4 x 3.0 x 2.5 cm and contains a simple cyst measuring 2.8 x 2.0 x 2.2 cm (? Corpus luteal cyst). It is normal in appearance. Left Ovary measures 3.2 x 1.5 x 2.0 cm. It is normal appearance. There is evidence of a corpus luteal cyst in the right ovary. Survey of the adnexa demonstrates no adnexal masses. There is no free peritoneal fluid in the cul de sac.  Impression: 1. A fetal pole was not identified. 2. Gestational sac within the fundus of the uterus measuring approximately 5 2/[redacted] weeks gestation. 3. Normal appearing yolk sac. 4. Bilateral ovaries appear WNL.  Probable corpus luteal cyst identified in right ovary.  Recommendations: 1.Clinical correlation with the patient's History and Physical Exam. 2. F/U U/S in 2 weeks to reassess viability. Assessment:   1. Missed menses  2. Cramping affecting pregnancy  Plan:   Ultrasound findings reviewed with patient, verbalized understanding.   First trimester education; see AVS.   Encouraged prenatal vitamin with folic acid and DHA; samples given.   Reviewed red flag symptoms and when to call.   RTC x 2-3 weeks for dating/viability [name] and nurse intake or sooner if needed.    Diona Fanti, CNM [facility], [facility]   A total of 20 minutes were spent face-to-face with the patient during the encounter with greater than 50% dealing with counseling and coordination of care.

## 2018-02-28 ENCOUNTER — Other Ambulatory Visit: Payer: Self-pay | Admitting: Certified Nurse Midwife

## 2018-02-28 DIAGNOSIS — N926 Irregular menstruation, unspecified: Secondary | ICD-10-CM

## 2018-03-03 ENCOUNTER — Encounter: Payer: BLUE CROSS/BLUE SHIELD | Admitting: Maternal Newborn

## 2018-03-04 ENCOUNTER — Ambulatory Visit (INDEPENDENT_AMBULATORY_CARE_PROVIDER_SITE_OTHER): Payer: BLUE CROSS/BLUE SHIELD

## 2018-03-04 ENCOUNTER — Other Ambulatory Visit (HOSPITAL_COMMUNITY)
Admission: RE | Admit: 2018-03-04 | Discharge: 2018-03-04 | Disposition: A | Discharge status: Home / Self Care | Payer: BLUE CROSS/BLUE SHIELD | Source: Ambulatory Visit | Attending: Certified Nurse Midwife | Admitting: Certified Nurse Midwife

## 2018-03-04 ENCOUNTER — Ambulatory Visit (INDEPENDENT_AMBULATORY_CARE_PROVIDER_SITE_OTHER): Payer: BLUE CROSS/BLUE SHIELD | Admitting: Certified Nurse Midwife

## 2018-03-04 VITALS — BP 106/72 | HR 91 | Ht 63.0 in | Wt 191.5 lb

## 2018-03-04 DIAGNOSIS — O3411 Maternal care for benign tumor of corpus uteri, first trimester: Secondary | ICD-10-CM

## 2018-03-04 DIAGNOSIS — N8311 Corpus luteum cyst of right ovary: Secondary | ICD-10-CM

## 2018-03-04 DIAGNOSIS — Z202 Contact with and (suspected) exposure to infections with a predominantly sexual mode of transmission: Secondary | ICD-10-CM | POA: Diagnosis not present

## 2018-03-04 DIAGNOSIS — R638 Other symptoms and signs concerning food and fluid intake: Secondary | ICD-10-CM

## 2018-03-04 DIAGNOSIS — T7589XA Other specified effects of external causes, initial encounter: Secondary | ICD-10-CM | POA: Diagnosis not present

## 2018-03-04 DIAGNOSIS — Z3401 Encounter for supervision of normal first pregnancy, first trimester: Secondary | ICD-10-CM | POA: Diagnosis not present

## 2018-03-04 DIAGNOSIS — Z3A01 Less than 8 weeks gestation of pregnancy: Secondary | ICD-10-CM

## 2018-03-04 DIAGNOSIS — N926 Irregular menstruation, unspecified: Secondary | ICD-10-CM

## 2018-03-04 DIAGNOSIS — B009 Herpesviral infection, unspecified: Secondary | ICD-10-CM

## 2018-03-04 NOTE — Patient Instructions (Signed)
WHAT OB PATIENTS CAN EXPECT   Confirmation of pregnancy and ultrasound ordered if medically indicated-[redacted] weeks gestation  New OB (NOB) intake with nurse and New OB (NOB) labs- [redacted] weeks gestation  New OB (NOB) physical examination with provider- 11/[redacted] weeks gestation  Flu vaccine-[redacted] weeks gestation  Anatomy scan-[redacted] weeks gestation  Glucose tolerance test, blood work to test for anemia, T-dap vaccine-[redacted] weeks gestation  Vaginal swabs/cultures-STD/Group B strep-[redacted] weeks gestation  Appointments every 4 weeks until 28 weeks  Every 2 weeks from 28 weeks until 36 weeks  Weekly visits from 36 weeks until delivery  Morning Sickness Morning sickness is when you feel sick to your stomach (nauseous) during pregnancy. You may feel sick to your stomach and throw up (vomit). You may feel sick in the morning, but you can feel this way any time of day. Some women feel very sick to their stomach and cannot stop throwing up (hyperemesis gravidarum). Follow these instructions at home:  Only take medicines as told by your doctor.  Take multivitamins as told by your doctor. Taking multivitamins before getting pregnant can stop or lessen the harshness of morning sickness.  Eat dry toast or unsalted crackers before getting out of bed.  Eat 5 to 6 small meals a day.  Eat dry and bland foods like rice and baked potatoes.  Do not drink liquids with meals. Drink between meals.  Do not eat greasy, fatty, or spicy foods.  Have someone cook for you if the smell of food causes you to feel sick or throw up.  If you feel sick to your stomach after taking prenatal vitamins, take them at night or with a snack.  Eat protein when you need a snack (nuts, yogurt, cheese).  Eat unsweetened gelatins for dessert.  Wear a bracelet used for sea sickness (acupressure wristband).  Go to a doctor that puts thin needles into certain body points (acupuncture) to improve how you feel.  Do not smoke.  Use a  humidifier to keep the air in your house free of odors.  Get lots of fresh air. Contact a doctor if:  You need medicine to feel better.  You feel dizzy or lightheaded.  You are losing weight. Get help right away if:  You feel very sick to your stomach and cannot stop throwing up.  You pass out (faint). This information is not intended to replace advice given to you by your health care provider. Make sure you discuss any questions you have with your health care provider. Document Released: 09/10/2004 Document Revised: 01/09/2016 Document Reviewed: 01/18/2013 Elsevier Interactive Patient Education  2017 [employer]. How a Baby Grows During Pregnancy Pregnancy begins when a female's sperm enters a female's egg (fertilization). This happens in one of the tubes (fallopian tubes) that connect the ovaries to the womb (uterus). The fertilized egg is called an embryo until it reaches 10 weeks. From 10 weeks until birth, it is called a fetus. The fertilized egg moves down the fallopian tube to the uterus. Then it implants into the lining of the uterus and begins to grow. The developing fetus receives oxygen and nutrients through the pregnant woman's bloodstream and the tissues that grow (placenta) to support the fetus. The placenta is the life support system for the fetus. It provides nutrition and removes waste. Learning as much as you can about your pregnancy and how your baby is developing can help you enjoy the experience. It can also make you aware of when there might be a problem  and when to ask questions. How long does a typical pregnancy last? A pregnancy usually lasts 280 days, or about 40 weeks. Pregnancy is divided into three trimesters:  First trimester: 0-13 weeks.  Second trimester: 14-27 weeks.  Third trimester: 28-40 weeks.  The day when your baby is considered ready to be born (full term) is your estimated date of delivery. How does my baby develop month by month? First  month  The fertilized egg attaches to the inside of the uterus.  Some cells will form the placenta. Others will form the fetus.  The arms, legs, brain, spinal cord, lungs, and heart begin to develop.  At the end of the first month, the heart begins to beat.  Second month  The bones, inner ear, eyelids, hands, and feet form.  The genitals develop.  By the end of 8 weeks, all major organs are developing.  Third month  All of the internal organs are forming.  Teeth develop below the gums.  Bones and muscles begin to grow. The spine can flex.  The skin is transparent.  Fingernails and toenails begin to form.  Arms and legs continue to grow longer, and hands and feet develop.  The fetus is about 3 in (7.6 cm) long.  Fourth month  The placenta is completely formed.  The external sex organs, neck, outer ear, eyebrows, eyelids, and fingernails are formed.  The fetus can hear, swallow, and move its arms and legs.  The kidneys begin to produce urine.  The skin is covered with a white waxy coating (vernix) and very fine hair (lanugo).  Fifth month  The fetus moves around more and can be felt for the first time (quickening).  The fetus starts to sleep and wake up and may begin to suck its finger.  The nails grow to the end of the fingers.  The organ in the digestive system that makes bile (gallbladder) functions and helps to digest the nutrients.  If your baby is a girl, eggs are present in her ovaries. If your baby is a boy, testicles start to move down into his scrotum.  Sixth month  The lungs are formed, but the fetus is not yet able to breathe.  The eyes open. The brain continues to develop.  Your baby has fingerprints and toe prints. Your baby's hair grows thicker.  At the end of the second trimester, the fetus is about 9 in (22.9 cm) long.  Seventh month  The fetus kicks and stretches.  The eyes are developed enough to sense changes in light.  The  hands can make a grasping motion.  The fetus responds to sound.  Eighth month  All organs and body systems are fully developed and functioning.  Bones harden and taste buds develop. The fetus may hiccup.  Certain areas of the brain are still developing. The skull remains soft.  Ninth month  The fetus gains about  lb (0.23 kg) each week.  The lungs are fully developed.  Patterns of sleep develop.  The fetus's head typically moves into a head-down position (vertex) in the uterus to prepare for birth. If the buttocks move into a vertex position instead, the baby is breech.  The fetus weighs 6-9 lbs (2.72-4.08 kg) and is 19-20 in (48.26-50.8 cm) long.  What can I do to have a healthy pregnancy and help my baby develop? Eating and Drinking  Eat a healthy diet. ? Talk with your health care provider to make sure that you are getting the  nutrients that you and your baby need. ? Visit www.BuildDNA.es to learn about creating a healthy diet.  Gain a healthy amount of weight during pregnancy as advised by your health care provider. This is usually 25-35 pounds. You may need to: ? Gain more if you were underweight before getting pregnant or if you are pregnant with more than one baby. ? Gain less if you were overweight or obese when you got pregnant.  Medicines and Vitamins  Take prenatal vitamins as directed by your health care provider. These include vitamins such as folic acid, iron, calcium, and vitamin D. They are important for healthy development.  Take medicines only as directed by your health care provider. Read labels and ask a pharmacist or your health care provider whether over-the-counter medicines, supplements, and prescription drugs are safe to take during pregnancy.  Activities  Be physically active as advised by your health care provider. Ask your health care provider to recommend activities that are safe for you to do, such as walking or swimming.  Do not  participate in strenuous or extreme sports.  Lifestyle  Do not drink alcohol.  Do not use any tobacco products, including cigarettes, chewing tobacco, or electronic cigarettes. If you need help quitting, ask your health care provider.  Do not use illegal drugs.  Safety  Avoid exposure to mercury, lead, or other heavy metals. Ask your health care provider about common sources of these heavy metals.  Avoid listeria infection during pregnancy. Follow these precautions: ? Do not eat soft cheeses or deli meats. ? Do not eat hot dogs unless they have been warmed up to the point of steaming, such as in the microwave oven. ? Do not drink unpasteurized milk.  Avoid toxoplasmosis infection during pregnancy. Follow these precautions: ? Do not change your cat's litter box, if you have a cat. Ask someone else to do this for you. ? Wear gardening gloves while working in the yard.  General Instructions  Keep all follow-up visits as directed by your health care provider. This is important. This includes prenatal care and screening tests.  Manage any chronic health conditions. Work closely with your health care provider to keep conditions, such as diabetes, under control.  How do I know if my baby is developing well? At each prenatal visit, your health care provider will do several different tests to check on your health and keep track of your baby's development. These include:  Fundal height. ? Your health care provider will measure your growing belly from top to bottom using a tape measure. ? Your health care provider will also feel your belly to determine your baby's position.  Heartbeat. ? An ultrasound in the first trimester can confirm pregnancy and show a heartbeat, depending on how far along you are. ? Your health care provider will check your baby's heart rate at every prenatal visit. ? As you get closer to your delivery date, you may have regular fetal heart rate monitoring to make  sure that your baby is not in distress.  Second trimester ultrasound. ? This ultrasound checks your baby's development. It also indicates your baby's gender.  What should I do if I have concerns about my baby's development? Always talk with your health care provider about any concerns that you may have. This information is not intended to replace advice given to you by your health care provider. Make sure you discuss any questions you have with your health care provider. Document Released: 01/20/2008 Document Revised: 01/09/2016 Document Reviewed:  01/10/2014 Elsevier Interactive Patient Education  2018 [place] Trimester of Pregnancy The first trimester of pregnancy is from week 1 until the end of week 13 (months 1 through 3). A week after a sperm fertilizes an egg, the egg will implant on the wall of the uterus. This embryo will begin to develop into a baby. Genes from you and your partner will form the baby. The female genes will determine whether the baby will be a boy or a girl. At 6-8 weeks, the eyes and face will be formed, and the heartbeat can be seen on ultrasound. At the end of 12 weeks, all the baby's organs will be formed. Now that you are pregnant, you will want to do everything you can to have a healthy baby. Two of the most important things are to get good prenatal care and to follow your health care provider's instructions. Prenatal care is all the medical care you receive before the baby's birth. This care will help prevent, find, and treat any problems during the pregnancy and childbirth. Body changes during your first trimester Your body goes through many changes during pregnancy. The changes vary from woman to woman.  You may gain or lose a couple of pounds at first.  You may feel sick to your stomach (nauseous) and you may throw up (vomit). If the vomiting is uncontrollable, call your health care provider.  You may tire easily.  You may develop headaches that can  be relieved by medicines. All medicines should be approved by your health care provider.  You may urinate more often. Painful urination may mean you have a bladder infection.  You may develop heartburn as a result of your pregnancy.  You may develop constipation because certain hormones are causing the muscles that push stool through your intestines to slow down.  You may develop hemorrhoids or swollen veins (varicose veins).  Your breasts may begin to grow larger and become tender. Your nipples may stick out more, and the tissue that surrounds them (areola) may become darker.  Your gums may bleed and may be sensitive to brushing and flossing.  Dark spots or blotches (chloasma, mask of pregnancy) may develop on your face. This will likely fade after the baby is born.  Your menstrual periods will stop.  You may have a loss of appetite.  You may develop cravings for certain kinds of food.  You may have changes in your emotions from day to day, such as being excited to be pregnant or being concerned that something may go wrong with the pregnancy and baby.  You may have more vivid and strange dreams.  You may have changes in your hair. These can include thickening of your hair, rapid growth, and changes in texture. Some women also have hair loss during or after pregnancy, or hair that feels dry or thin. Your hair will most likely return to normal after your baby is born.  What to expect at prenatal visits During a routine prenatal visit:  You will be weighed to make sure you and the baby are growing normally.  Your blood pressure will be taken.  Your abdomen will be measured to track your baby's growth.  The fetal heartbeat will be listened to between weeks 10 and 14 of your pregnancy.  Test results from any previous visits will be discussed.  Your health care provider may ask you:  How you are feeling.  If you are feeling the baby move.  If you have had any  abnormal  symptoms, such as leaking fluid, bleeding, severe headaches, or abdominal cramping.  If you are using any tobacco products, including cigarettes, chewing tobacco, and electronic cigarettes.  If you have any questions.  Other tests that may be performed during your first trimester include:  Blood tests to find your blood type and to check for the presence of any previous infections. The tests will also be used to check for low iron levels (anemia) and protein on red blood cells (Rh antibodies). Depending on your risk factors, or if you previously had diabetes during pregnancy, you may have tests to check for high blood sugar that affects pregnant women (gestational diabetes).  Urine tests to check for infections, diabetes, or protein in the urine.  An ultrasound to confirm the proper growth and development of the baby.  Fetal screens for spinal cord problems (spina bifida) and Down syndrome.  HIV (human immunodeficiency virus) testing. Routine prenatal testing includes screening for HIV, unless you choose not to have this test.  You may need other tests to make sure you and the baby are doing well.  Follow these instructions at home: Medicines  Follow your health care provider's instructions regarding medicine use. Specific medicines may be either safe or unsafe to take during pregnancy.  Take a prenatal vitamin that contains at least 600 micrograms (mcg) of folic acid.  If you develop constipation, try taking a stool softener if your health care provider approves. Eating and drinking  Eat a balanced diet that includes fresh fruits and vegetables, whole grains, good sources of protein such as meat, eggs, or tofu, and low-fat dairy. Your health care provider will help you determine the amount of weight gain that is right for you.  Avoid raw meat and uncooked cheese. These carry germs that can cause birth defects in the baby.  Eating four or five small meals rather than three large  meals a day may help relieve nausea and vomiting. If you start to feel nauseous, eating a few soda crackers can be helpful. Drinking liquids between meals, instead of during meals, also seems to help ease nausea and vomiting.  Limit foods that are high in fat and processed sugars, such as fried and sweet foods.  To prevent constipation: ? Eat foods that are high in fiber, such as fresh fruits and vegetables, whole grains, and beans. ? Drink enough fluid to keep your urine clear or pale yellow. Activity  Exercise only as directed by your health care provider. Most women can continue their usual exercise routine during pregnancy. Try to exercise for 30 minutes at least 5 days a week. Exercising will help you: ? Control your weight. ? Stay in shape. ? Be prepared for labor and delivery.  Experiencing pain or cramping in the lower abdomen or lower back is a good sign that you should stop exercising. Check with your health care provider before continuing with normal exercises.  Try to avoid standing for long periods of time. Move your legs often if you must stand in one place for a long time.  Avoid heavy lifting.  Wear low-heeled shoes and practice good posture.  You may continue to have sex unless your health care provider tells you not to. Relieving pain and discomfort  Wear a good support bra to relieve breast tenderness.  Take warm sitz baths to soothe any pain or discomfort caused by hemorrhoids. Use hemorrhoid cream if your health care provider approves.  Rest with your legs elevated if you have leg   cramps or low back pain.  If you develop varicose veins in your legs, wear support hose. Elevate your feet for 15 minutes, 3-4 times a day. Limit salt in your diet. Prenatal care  Schedule your prenatal visits by the twelfth week of pregnancy. They are usually scheduled monthly at first, then more often in the last 2 months before delivery.  Write down your questions. Take them to  your prenatal visits.  Keep all your prenatal visits as told by your health care provider. This is important. Safety  Wear your seat belt at all times when driving.  Make a list of emergency phone numbers, including numbers for family, friends, the hospital, and police and fire departments. General instructions  Ask your health care provider for a referral to a local prenatal education class. Begin classes no later than the beginning of month 6 of your pregnancy.  Ask for help if you have counseling or nutritional needs during pregnancy. Your health care provider can offer advice or refer you to specialists for help with various needs.  Do not use hot tubs, steam rooms, or saunas.  Do not douche or use tampons or scented sanitary pads.  Do not cross your legs for long periods of time.  Avoid cat litter boxes and soil used by cats. These carry germs that can cause birth defects in the baby and possibly loss of the fetus by miscarriage or stillbirth.  Avoid all smoking, herbs, alcohol, and medicines not prescribed by your health care provider. Chemicals in these products affect the formation and growth of the baby.  Do not use any products that contain nicotine or tobacco, such as cigarettes and e-cigarettes. If you need help quitting, ask your health care provider. You may receive counseling support and other resources to help you quit.  Schedule a dentist appointment. At home, brush your teeth with a soft toothbrush and be gentle when you floss. Contact a health care provider if:  You have dizziness.  You have mild pelvic cramps, pelvic pressure, or nagging pain in the abdominal area.  You have persistent nausea, vomiting, or diarrhea.  You have a bad smelling vaginal discharge.  You have pain when you urinate.  You notice increased swelling in your face, hands, legs, or ankles.  You are exposed to fifth disease or chickenpox.  You are exposed to German measles (rubella)  and have never had it. Get help right away if:  You have a fever.  You are leaking fluid from your vagina.  You have spotting or bleeding from your vagina.  You have severe abdominal cramping or pain.  You have rapid weight gain or loss.  You vomit blood or material that looks like coffee grounds.  You develop a severe headache.  You have shortness of breath.  You have any kind of trauma, such as from a fall or a car accident. Summary  The first trimester of pregnancy is from week 1 until the end of week 13 (months 1 through 3).  Your body goes through many changes during pregnancy. The changes vary from woman to woman.  You will have routine prenatal visits. During those visits, your health care provider will examine you, discuss any test results you may have, and talk with you about how you are feeling. This information is not intended to replace advice given to you by your health care provider. Make sure you discuss any questions you have with your health care provider. Document Released: 07/28/2001 Document Revised: 07/15/2016 Document   Reviewed: 07/15/2016 Elsevier Interactive Patient Education  2018 [employer]. Commonly Asked Questions During Pregnancy  Cats: A parasite can be excreted in cat feces.  To avoid exposure you need to have another person empty the little box.  If you must empty the litter box you will need to wear gloves.  Wash your hands after handling your cat.  This parasite can also be found in raw or undercooked meat so this should also be avoided.  Colds, Sore Throats, Flu: Please check your medication sheet to see what you can take for symptoms.  If your symptoms are unrelieved by these medications please call the office.  Dental Work: Most any dental work [employer] recommends is permitted.  X-rays should only be taken during the first trimester if absolutely necessary.  Your abdomen should be shielded with a lead apron during all x-rays.  Please  notify your provider prior to receiving any x-rays.  Novocaine is fine; gas is not recommended.  If your dentist requires a note from [name] prior to dental work please call the office and we will provide one for you.  Exercise: Exercise is an important part of staying healthy during your pregnancy.  You may continue most exercises you were accustomed to prior to pregnancy.  Later in your pregnancy you will most likely notice you have difficulty with activities requiring balance like riding a bicycle.  It is important that you listen to your body and avoid activities that put you at a higher risk of falling.  Adequate rest and staying well hydrated are a must!  If you have questions about the safety of specific activities ask your provider.    Exposure to Children with illness: Try to avoid obvious exposure; report any symptoms to [name] when noted,  If you have chicken pos, red measles or mumps, you should be immune to these diseases.   Please do not take any vaccines while pregnant unless you have checked with your OB provider.  Fetal Movement: After 28 weeks we recommend you do "kick counts" twice daily.  Lie or sit down in a calm quiet environment and count your baby movements "kicks".  You should feel your baby at least 10 times per hour.  If you have not felt 10 kicks within the first hour get up, walk around and have something sweet to eat or drink then repeat for an additional hour.  If count remains less than 10 per hour notify your provider.  Fumigating: Follow your pest control agent's advice as to how long to stay out of your home.  Ventilate the area well before re-entering.  Hemorrhoids:   Most over-the-counter preparations can be used during pregnancy.  Check your medication to see what is safe to use.  It is important to use a stool softener or fiber in your diet and to drink lots of liquids.  If hemorrhoids seem to be getting worse please call the office.   Hot Tubs:  Hot tubs Jacuzzis and  saunas are not recommended while pregnant.  These increase your internal body temperature and should be avoided.  Intercourse:  Sexual intercourse is safe during pregnancy as long as you are comfortable, unless otherwise advised by your provider.  Spotting may occur after intercourse; report any bright red bleeding that is heavier than spotting.  Labor:  If you know that you are in labor, please go to the hospital.  If you are unsure, please call the office and let us help you decide what to  do.  Lifting, straining, etc:  If your job requires heavy lifting or straining please check with your provider for any limitations.  Generally, you should not lift items heavier than that you can lift simply with your hands and arms (no back muscles)  Painting:  Paint fumes do not harm your pregnancy, but may make you ill and should be avoided if possible.  Latex or water based paints have less odor than oils.  Use adequate ventilation while painting.  Permanents & Hair Color:  Chemicals in hair dyes are not recommended as they cause increase hair dryness which can increase hair loss during pregnancy.  " Highlighting" and permanents are allowed.  Dye may be absorbed differently and permanents may not hold as well during pregnancy.  Sunbathing:  Use a sunscreen, as skin burns easily during pregnancy.  Drink plenty of fluids; avoid over heating.  Tanning Beds:  Because their possible side effects are still unknown, tanning beds are not recommended.  Ultrasound Scans:  Routine ultrasounds are performed at approximately 20 weeks.  You will be able to see your baby's general anatomy an if you would like to know the gender this can usually be determined as well.  If it is questionable when you conceived you may also receive an ultrasound early in your pregnancy for dating purposes.  Otherwise ultrasound exams are not routinely performed unless there is a medical necessity.  Although you can request a scan we ask that  you pay for it when conducted because insurance does not cover " patient request" scans.  Work: If your pregnancy proceeds without complications you may work until your due date, unless your physician or employer advises otherwise.  Round Ligament Pain/Pelvic Discomfort:  Sharp, shooting pains not associated with bleeding are fairly common, usually occurring in the second trimester of pregnancy.  They tend to be worse when standing up or when you remain standing for long periods of time.  These are the result of pressure of certain pelvic ligaments called "round ligaments".  Rest, Tylenol and heat seem to be the most effective relief.  As the womb and fetus grow, they rise out of the pelvis and the discomfort improves.  Please notify the office if your pain seems different than that described.  It may represent a more serious condition.  Common Medications Safe in Pregnancy  Acne:      Constipation:  Benzoyl Peroxide     Colace  Clindamycin      Dulcolax Suppository  Topica Erythromycin     Fibercon  Salicylic Acid      Metamucil         Miralax AVOID:        Senakot   Accutane    Cough:  Retin-A       Cough Drops  Tetracycline      Phenergan w/ Codeine if Rx  Minocycline      Robitussin (Plain & DM)  Antibiotics:     Crabs/Lice:  Ceclor       RID  Cephalosporins    AVOID:  E-Mycins      Kwell  Keflex  Macrobid/Macrodantin   Diarrhea:  Penicillin      Kao-Pectate  Zithromax      Imodium AD         PUSH FLUIDS AVOID:       Cipro     Fever:  Tetracycline      Tylenol (Regular or Extra  Minocycline       Strength)  Levaquin      Extra Strength-Do not          Exceed 8 tabs/24 hrs Caffeine:        <200mg/day (equiv. To 1 cup of coffee or  approx. 3 12 oz sodas)         Gas: Cold/Hayfever:       Gas-X  Benadryl      Mylicon  Claritin       Phazyme  **Claritin-D        Chlor-Trimeton    Headaches:  Dimetapp      ASA-Free Excedrin  Drixoral-Non-Drowsy     Cold Compress  Mucinex  (Guaifenasin)     Tylenol (Regular or Extra  Sudafed/Sudafed-12 Hour     Strength)  **Sudafed PE Pseudoephedrine   Tylenol Cold & Sinus     Vicks Vapor Rub  Zyrtec  **AVOID if Problems With Blood Pressure         Heartburn: Avoid lying down for at least 1 hour after meals  Aciphex      Maalox     Rash:  Milk of Magnesia     Benadryl    Mylanta       1% Hydrocortisone Cream  Pepcid  Pepcid Complete   Sleep Aids:  Prevacid      Ambien   Prilosec       Benadryl  Rolaids       Chamomile Tea  Tums (Limit 4/day)     Unisom  Zantac       Tylenol PM         Warm milk-add vanilla or  Hemorrhoids:       Sugar for taste  Anusol/Anusol H.C.  (RX: Analapram 2.5%)  Sugar Substitutes:  Hydrocortisone OTC     Ok in moderation  Preparation H      Tucks        Vaseline lotion applied to tissue with wiping    Herpes:     Throat:  Acyclovir      Oragel  Famvir  Valtrex     Vaccines:         Flu Shot Leg Cramps:       *Gardasil  Benadryl      Hepatitis A         Hepatitis B Nasal Spray:       Pneumovax  Saline Nasal Spray     Polio Booster         Tetanus Nausea:       Tuberculosis test or PPD  Vitamin B6 25 mg TID   AVOID:    Dramamine      *Gardasil  Emetrol       Live Poliovirus  Ginger Root 250 mg QID    MMR (measles, mumps &  High Complex Carbs @ Bedtime    rebella)  Sea Bands-Accupressure    Varicella (Chickenpox)  Unisom 1/2 tab TID     *No known complications           If received before Pain:         Known pregnancy;   Darvocet       Resume series after  Lortab        Delivery  Percocet    Yeast:   Tramadol      Femstat  Tylenol 3      Gyne-lotrimin  Ultram       Monistat  Vicodin           MISC:           All Sunscreens           Hair Coloring/highlights          Insect Repellant's          (Including DEET)         [facility]  [place], [place], [place] [address]  Phone: [phone]   [facility] (second location)  [address] [place], [place] [address]  Phone: [phone]   [facility]) [place], [place], [place] [address] Phone: [phone]   [facility] [address] [place], [place] [address]  Phone: [phone]

## 2018-03-04 NOTE — Progress Notes (Signed)
[name] presents for NOB nurse interview visit. Pregnancy confirmation done _with JML on 02/25/2018.  G1- .  P0. Dating scan today  shows SIUP at 20 1/7 with EDD of 10/27/2018. Pregnancy education material explained and given. __pos  for _ cats in the home. Advised to not change litter box. Toxoplasma ordered. NOB labs ordered. TSH/HbgA1c ordered due to increased bmi (Body mass index is 33.92 kg/m.) HIV labs and Drug screen were explained and ordered.  PNV encouraged.  Genetic screening options discussed. Genetic testing: Unsure.  Pt may discuss with provider.  Needs tdap at 28 weeks. Unsure of last pap- no h/o abn. Mild n/v. No meds needed at this time. 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 Financial policy reviewed. FMLA paper explained and signed. Pt. To follow up with provider in _5/6_ weeks for NOB physical.  All questions answered.

## 2018-03-04 NOTE — Progress Notes (Signed)
I have reviewed the record and concur with patient management and plan.    Esmerelda Finnigan Michelle Daven Pinckney, CNM [facility] 

## 2018-03-05 ENCOUNTER — Encounter: Payer: Self-pay | Admitting: Certified Nurse Midwife

## 2018-03-05 DIAGNOSIS — Z283 Underimmunization status: Secondary | ICD-10-CM | POA: Insufficient documentation

## 2018-03-05 DIAGNOSIS — Z2839 Other underimmunization status: Secondary | ICD-10-CM | POA: Insufficient documentation

## 2018-03-05 DIAGNOSIS — O09899 Supervision of other high risk pregnancies, unspecified trimester: Secondary | ICD-10-CM | POA: Insufficient documentation

## 2018-03-05 DIAGNOSIS — O9989 Other specified diseases and conditions complicating pregnancy, childbirth and the puerperium: Secondary | ICD-10-CM

## 2018-03-05 LAB — CBC WITH DIFFERENTIAL/PLATELET
BASOS ABS: 0 10*3/uL (ref 0.0–0.2)
Basos: 0 %
EOS (ABSOLUTE): 0 10*3/uL (ref 0.0–0.4)
EOS: 0 %
HEMATOCRIT: 41.2 % (ref 34.0–46.6)
HEMOGLOBIN: 13.4 g/dL (ref 11.1–15.9)
IMMATURE GRANS (ABS): 0 10*3/uL (ref 0.0–0.1)
Immature Granulocytes: 0 %
LYMPHS: 21 %
Lymphocytes Absolute: 1.4 10*3/uL (ref 0.7–3.1)
MCH: 29.6 pg (ref 26.6–33.0)
MCHC: 32.5 g/dL (ref 31.5–35.7)
MCV: 91 fL (ref 79–97)
MONOCYTES: 6 %
Monocytes Absolute: 0.4 10*3/uL (ref 0.1–0.9)
NEUTROS ABS: 5 10*3/uL (ref 1.4–7.0)
Neutrophils: 73 %
Platelets: 233 10*3/uL (ref 150–450)
RBC: 4.53 x10E6/uL (ref 3.77–5.28)
RDW: 12 % — ABNORMAL LOW (ref 12.3–15.4)
WBC: 6.8 10*3/uL (ref 3.4–10.8)

## 2018-03-05 LAB — DRUG PROFILE, UR, 9 DRUGS (LABCORP)
AMPHETAMINES, URINE: NEGATIVE ng/mL
BARBITURATE QUANT UR: NEGATIVE ng/mL
Benzodiazepine Quant, Ur: NEGATIVE ng/mL
CANNABINOID QUANT UR: NEGATIVE ng/mL
COCAINE (METAB.): NEGATIVE ng/mL
Methadone Screen, Urine: NEGATIVE ng/mL
OPIATE QUANT UR: NEGATIVE ng/mL
PCP Quant, Ur: NEGATIVE ng/mL
Propoxyphene: NEGATIVE ng/mL

## 2018-03-05 LAB — NICOTINE SCREEN, URINE: COTININE UR QL SCN: NEGATIVE ng/mL

## 2018-03-05 LAB — URINALYSIS, ROUTINE W REFLEX MICROSCOPIC
Bilirubin, UA: NEGATIVE
GLUCOSE, UA: NEGATIVE
Leukocytes, UA: NEGATIVE
NITRITE UA: NEGATIVE
RBC, UA: NEGATIVE
Specific Gravity, UA: >=1.03 — AB (ref 1.005–1.030)
UUROB: 1 mg/dL (ref 0.2–1.0)
pH, UA: 6.5 (ref 5.0–7.5)

## 2018-03-05 LAB — ABO AND RH: Rh Factor: POSITIVE

## 2018-03-05 LAB — TOXOPLASMA ANTIBODIES- IGG AND  IGM: Toxoplasma Antibody- IgM: <3 AU/mL (ref 0.0–7.9)

## 2018-03-05 LAB — HEPATITIS B SURFACE ANTIGEN: Hepatitis B Surface Ag: NEGATIVE

## 2018-03-05 LAB — RUBELLA SCREEN

## 2018-03-05 LAB — GLUCOSE, RANDOM: GLUCOSE: 103 mg/dL — AB (ref 65–99)

## 2018-03-05 LAB — ANTIBODY SCREEN: ANTIBODY SCREEN: NEGATIVE

## 2018-03-05 LAB — HIV ANTIBODY (ROUTINE TESTING W REFLEX): HIV Screen 4th Generation wRfx: NONREACTIVE

## 2018-03-05 LAB — HEMOGLOBIN A1C
ESTIMATED AVERAGE GLUCOSE: 100 mg/dL
Hgb A1c MFr Bld: 5.1 % (ref 4.8–5.6)

## 2018-03-05 LAB — RPR: RPR Ser Ql: NONREACTIVE

## 2018-03-05 LAB — VARICELLA ZOSTER ANTIBODY, IGG: VARICELLA: 943 {index} (ref >165)

## 2018-03-06 ENCOUNTER — Encounter: Payer: Self-pay | Admitting: Certified Nurse Midwife

## 2018-03-06 LAB — CULTURE, OB URINE

## 2018-03-06 LAB — URINE CULTURE, OB REFLEX: ORGANISM ID, BACTERIA: NO GROWTH

## 2018-03-07 LAB — GC/CHLAMYDIA PROBE AMP (~~LOC~~) NOT AT ARMC
CHLAMYDIA, DNA PROBE: NEGATIVE
Neisseria Gonorrhea: NEGATIVE

## 2018-04-08 ENCOUNTER — Encounter: Payer: BLUE CROSS/BLUE SHIELD | Admitting: Certified Nurse Midwife

## 2018-04-25 ENCOUNTER — Encounter

## 2018-04-25 ENCOUNTER — Ambulatory Visit: Payer: Worker's Compensation | Admitting: Neurology

## 2018-04-29 ENCOUNTER — Ambulatory Visit: Payer: Self-pay | Admitting: Neurology

## 2018-06-28 ENCOUNTER — Ambulatory Visit: Payer: Self-pay | Admitting: Obstetrics and Gynecology

## 2018-07-08 ENCOUNTER — Ambulatory Visit: Payer: BLUE CROSS/BLUE SHIELD | Admitting: Psychology

## 2018-07-26 ENCOUNTER — Ambulatory Visit (INDEPENDENT_AMBULATORY_CARE_PROVIDER_SITE_OTHER): Payer: BLUE CROSS/BLUE SHIELD | Admitting: Obstetrics and Gynecology

## 2018-07-26 ENCOUNTER — Encounter: Payer: Self-pay | Admitting: Obstetrics and Gynecology

## 2018-07-26 ENCOUNTER — Other Ambulatory Visit (HOSPITAL_COMMUNITY)
Admission: RE | Admit: 2018-07-26 | Discharge: 2018-07-26 | Disposition: A | Discharge status: Home / Self Care | Payer: BLUE CROSS/BLUE SHIELD | Source: Ambulatory Visit | Attending: Obstetrics and Gynecology | Admitting: Obstetrics and Gynecology

## 2018-07-26 VITALS — BP 112/60 | HR 74 | Ht 63.0 in | Wt 183.0 lb

## 2018-07-26 DIAGNOSIS — Z124 Encounter for screening for malignant neoplasm of cervix: Secondary | ICD-10-CM | POA: Diagnosis not present

## 2018-07-26 DIAGNOSIS — Z30013 Encounter for initial prescription of injectable contraceptive: Secondary | ICD-10-CM

## 2018-07-26 DIAGNOSIS — Z113 Encounter for screening for infections with a predominantly sexual mode of transmission: Secondary | ICD-10-CM | POA: Insufficient documentation

## 2018-07-26 DIAGNOSIS — Z01419 Encounter for gynecological examination (general) (routine) without abnormal findings: Secondary | ICD-10-CM

## 2018-07-26 MED ORDER — MEDROXYPROGESTERONE ACETATE 150 MG/ML IM SUSY: 150.0000 mg | PREFILLED_SYRINGE | Freq: Once | INTRAMUSCULAR | 3 refills | Status: DC

## 2018-07-26 NOTE — Progress Notes (Signed)
PCP:  Donald Prose, MD   Chief Complaint  Patient presents with  . Gynecologic Exam    would like to get on [facility], interested in DEPO     HPI:      Ms. Emily Hernandez is a 22 y.o. G1P0 who LMP was Patient's last menstrual period was 07/18/2018 (exact date)., presents today for her NP annual examination.  Her menses are regular every 28-30 days, lasting 3-10 days.  Dysmenorrhea mild, occurring first 1-2 days of flow. She does not have intermenstrual bleeding.  Sex activity: single partner, contraception - none. She is interested in depo. Did it in past and wants to restart. S/P EAB 8/19. Wants to start it this cycle but should have started by yesterday. Last Pap: not recent; no hx of abn Hx of STDs: none (but had sex contact with person who had herpes; pt never had sx)  There is no FH of breast cancer. There is no FH of ovarian cancer. The patient does not do self-breast exams.  Tobacco use: vapes daily, knows she should quit Alcohol use: social drinker No drug use.  Exercise: not active  She does not get adequate calcium and Vitamin D in her diet. Gard completed.    Past Medical History:  Diagnosis Date  . Idiopathic intracranial hypertension   . Vaccine for human papilloma virus (HPV) types 6, 11, 16, and 18 administered     Past Surgical History:  Procedure Laterality Date  . none      Family History  Problem Relation Age of Onset  . GI Bleed Maternal Grandmother   . Heart attack Maternal Grandmother   . Healthy Brother   . Breast cancer Neg Hx   . Ovarian cancer Neg Hx   . Colon cancer Neg Hx   . Diabetes Neg Hx     Social History   Socioeconomic History  . Marital status: Single    Spouse name: Not on file  . Number of children: 0  . Years of education: Not on file  . Highest education level: Not on file  Occupational History  . Occupation: Psychologist, prison and probation services Needs  . Financial resource strain: Not on file  . Food insecurity:    Worry: Not on file    Inability: Not on file  . Transportation needs:    Medical: Not on file    Non-medical: Not on file  Tobacco Use  . Smoking status: Current Every Day Smoker    Packs/day: 0.25    Years: 2.00    Pack years: 0.50    Types: Cigarettes    Last attempt to quit: 02/21/2018    Years since quitting: 0.4  . Smokeless tobacco: Current User    Last attempt to quit: 08/17/2012  Substance and Sexual Activity  . Alcohol use: Yes  . Drug use: No  . Sexual activity: Yes    Birth control/protection: None  Lifestyle  . Physical activity:    Days per week: Not on file    Minutes per session: Not on file  . Stress: Not on file  Relationships  . Social connections:    Talks on phone: Not on file    Gets together: Not on file    Attends religious service: Not on file    Active member of club or organization: Not on file    Attends meetings of clubs or organizations: Not on file    Relationship status: Not on file  . Intimate partner violence:  Fear of current or ex partner: Not on file    Emotionally abused: Not on file    Physically abused: Not on file    Forced sexual activity: Not on file  Other Topics Concern  . 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    Outpatient Medications Prior to Visit  Medication Sig Dispense Refill  . Prenatal Vit-Fe Fumarate-FA (PRENATAL MULTIVITAMIN) TABS tablet Take 1 tablet by mouth daily at 12 noon.     No facility-administered medications prior to visit.       ROS:  Review of Systems  Constitutional: Positive for fatigue. Negative for fever and unexpected weight change.  Respiratory: Positive for cough. Negative for shortness of breath and wheezing.   Cardiovascular: Negative for chest pain, palpitations and leg swelling.  Gastrointestinal: Negative for blood in stool, constipation, diarrhea, nausea and vomiting.    Endocrine: Negative for cold intolerance, heat intolerance and polyuria.  Genitourinary: Positive for vaginal discharge. Negative for dyspareunia, dysuria, flank pain, frequency, genital sores, hematuria, menstrual problem, pelvic pain, urgency, vaginal bleeding and vaginal pain.  Musculoskeletal: Negative for back pain, joint swelling and myalgias.  Skin: Negative for rash.  Neurological: Negative for dizziness, syncope, light-headedness, numbness and headaches.  Hematological: Negative for adenopathy.  Psychiatric/Behavioral: Positive for agitation and dysphoric mood. Negative for confusion, sleep disturbance and suicidal ideas. The patient is not nervous/anxious.   BREAST: No symptoms   Objective: BP 112/60   Pulse 74   Ht 5\' 3"  (1.6 m)   Wt 183 lb (83 kg)   LMP 07/18/2018 (Exact Date) Comment: irregular cycles  Breastfeeding? No   BMI 32.42 kg/m    Physical Exam  Constitutional: She is oriented to person, place, and time. She appears well-developed and well-nourished.  Genitourinary: Vagina normal and uterus normal. There is no rash or tenderness on the right labia. There is no rash or tenderness on the left labia. No erythema or tenderness in the vagina. No vaginal discharge found. Right adnexum does not display mass and does not display tenderness. Left adnexum does not display mass and does not display tenderness. Cervix does not exhibit motion tenderness or polyp. Uterus is not enlarged or tender.  Neck: Normal range of motion. No thyromegaly present.  Cardiovascular: Normal rate, regular rhythm and normal heart sounds.  No murmur heard. Pulmonary/Chest: Effort normal and breath sounds normal. Right breast exhibits no mass, no nipple discharge, no skin change and no tenderness. Left breast exhibits no mass, no nipple discharge, no skin change and no tenderness.  Abdominal: Soft. There is no tenderness. There is no guarding.  Musculoskeletal: Normal range of motion.   Neurological: She is alert and oriented to person, place, and time. No cranial nerve deficit.  Psychiatric: She has a normal mood and affect. Her behavior is normal.  Vitals reviewed.   Assessment/Plan: Encounter for annual routine gynecological examination  Cervical cancer screening - Plan: Cytology - PAP  Screening for STD (sexually transmitted disease) - Plan: Cytology - PAP  Encounter for initial prescription of injectable contraceptive - Depo start today after neg UPT. Rx eRxd. Rechk UPT again in 60mo. Increase calcium/Vit D. Condoms for 1 wk. - Plan: medroxyPROGESTERone Acetate 150 MG/ML SUSY  Meds ordered this encounter  Medications  . medroxyPROGESTERone Acetate 150 MG/ML SUSY    Sig: Inject 1 mL (150 mg total) into the  muscle once for 1 dose.    Dispense:  1 Syringe    Refill:  3    Order Specific Question:   Supervising Provider    Answer:   Gae Dry [[id number]]             GYN counsel STD prevention, family planning choices, adequate intake of calcium and vitamin D, diet and exercise     F/U  Return in about 1 year (around 07/27/2019).  Shealee Yordy B. Kaydee Magel, PA-C 07/26/2018 2:49 PM

## 2018-07-26 NOTE — Patient Instructions (Signed)
I value your feedback and entrusting us with your care. If you get a [facility] patient survey, I would appreciate you taking the time to let us know about your experience today. Thank you! 

## 2018-07-29 ENCOUNTER — Ambulatory Visit (INDEPENDENT_AMBULATORY_CARE_PROVIDER_SITE_OTHER): Payer: BLUE CROSS/BLUE SHIELD

## 2018-07-29 DIAGNOSIS — Z3202 Encounter for pregnancy test, result negative: Secondary | ICD-10-CM

## 2018-07-29 DIAGNOSIS — Z3042 Encounter for surveillance of injectable contraceptive: Secondary | ICD-10-CM | POA: Diagnosis not present

## 2018-07-29 LAB — CYTOLOGY - PAP
CHLAMYDIA, DNA PROBE: NEGATIVE
DIAGNOSIS: NEGATIVE
Neisseria Gonorrhea: NEGATIVE

## 2018-07-29 LAB — POCT URINE PREGNANCY: PREG TEST UR: NEGATIVE

## 2018-07-29 MED ORDER — MEDROXYPROGESTERONE ACETATE 150 MG/ML IM SUSP
150.0000 mg | Freq: Once | INTRAMUSCULAR | Status: AC
  Administered 2018-07-29: 150 mg via INTRAMUSCULAR

## 2018-09-10 DIAGNOSIS — L0100 Impetigo, unspecified: Secondary | ICD-10-CM | POA: Diagnosis not present

## 2018-09-13 ENCOUNTER — Ambulatory Visit (INDEPENDENT_AMBULATORY_CARE_PROVIDER_SITE_OTHER): Payer: BLUE CROSS/BLUE SHIELD | Admitting: Psychology

## 2018-09-13 DIAGNOSIS — F4310 Post-traumatic stress disorder, unspecified: Secondary | ICD-10-CM | POA: Diagnosis not present

## 2018-09-28 ENCOUNTER — Ambulatory Visit (INDEPENDENT_AMBULATORY_CARE_PROVIDER_SITE_OTHER): Payer: BLUE CROSS/BLUE SHIELD | Admitting: Psychology

## 2018-09-28 DIAGNOSIS — F4310 Post-traumatic stress disorder, unspecified: Secondary | ICD-10-CM | POA: Diagnosis not present

## 2018-10-11 DIAGNOSIS — F4310 Post-traumatic stress disorder, unspecified: Secondary | ICD-10-CM | POA: Diagnosis not present

## 2018-10-11 DIAGNOSIS — F3181 Bipolar II disorder: Secondary | ICD-10-CM | POA: Diagnosis not present

## 2018-10-11 DIAGNOSIS — F411 Generalized anxiety disorder: Secondary | ICD-10-CM | POA: Diagnosis not present

## 2018-10-14 ENCOUNTER — Ambulatory Visit (INDEPENDENT_AMBULATORY_CARE_PROVIDER_SITE_OTHER): Payer: BLUE CROSS/BLUE SHIELD | Admitting: Psychology

## 2018-10-14 DIAGNOSIS — F4310 Post-traumatic stress disorder, unspecified: Secondary | ICD-10-CM | POA: Diagnosis not present

## 2018-10-21 ENCOUNTER — Ambulatory Visit (INDEPENDENT_AMBULATORY_CARE_PROVIDER_SITE_OTHER): Payer: BLUE CROSS/BLUE SHIELD

## 2018-10-21 DIAGNOSIS — Z3042 Encounter for surveillance of injectable contraceptive: Secondary | ICD-10-CM | POA: Diagnosis not present

## 2018-10-21 MED ORDER — MEDROXYPROGESTERONE ACETATE 150 MG/ML IM SUSP
150.0000 mg | Freq: Once | INTRAMUSCULAR | Status: AC
  Administered 2018-10-21: 150 mg via INTRAMUSCULAR

## 2018-10-24 ENCOUNTER — Ambulatory Visit (INDEPENDENT_AMBULATORY_CARE_PROVIDER_SITE_OTHER): Payer: BLUE CROSS/BLUE SHIELD | Admitting: Psychology

## 2018-10-24 DIAGNOSIS — F4310 Post-traumatic stress disorder, unspecified: Secondary | ICD-10-CM

## 2018-11-04 ENCOUNTER — Ambulatory Visit (INDEPENDENT_AMBULATORY_CARE_PROVIDER_SITE_OTHER): Payer: BLUE CROSS/BLUE SHIELD | Admitting: Psychology

## 2018-11-04 ENCOUNTER — Other Ambulatory Visit: Payer: Self-pay

## 2018-11-04 DIAGNOSIS — F4310 Post-traumatic stress disorder, unspecified: Secondary | ICD-10-CM

## 2018-11-07 DIAGNOSIS — F4310 Post-traumatic stress disorder, unspecified: Secondary | ICD-10-CM | POA: Diagnosis not present

## 2018-11-07 DIAGNOSIS — F411 Generalized anxiety disorder: Secondary | ICD-10-CM | POA: Diagnosis not present

## 2018-11-11 ENCOUNTER — Ambulatory Visit (INDEPENDENT_AMBULATORY_CARE_PROVIDER_SITE_OTHER): Payer: BLUE CROSS/BLUE SHIELD | Admitting: Psychology

## 2018-11-11 DIAGNOSIS — F4310 Post-traumatic stress disorder, unspecified: Secondary | ICD-10-CM | POA: Diagnosis not present

## 2018-11-18 ENCOUNTER — Ambulatory Visit (INDEPENDENT_AMBULATORY_CARE_PROVIDER_SITE_OTHER): Payer: BLUE CROSS/BLUE SHIELD | Admitting: Psychology

## 2018-11-18 DIAGNOSIS — F4310 Post-traumatic stress disorder, unspecified: Secondary | ICD-10-CM

## 2018-12-02 ENCOUNTER — Ambulatory Visit (INDEPENDENT_AMBULATORY_CARE_PROVIDER_SITE_OTHER): Payer: BLUE CROSS/BLUE SHIELD | Admitting: Psychology

## 2018-12-02 DIAGNOSIS — F4310 Post-traumatic stress disorder, unspecified: Secondary | ICD-10-CM

## 2018-12-07 ENCOUNTER — Ambulatory Visit: Payer: BLUE CROSS/BLUE SHIELD | Admitting: Psychology

## 2018-12-09 ENCOUNTER — Ambulatory Visit (INDEPENDENT_AMBULATORY_CARE_PROVIDER_SITE_OTHER): Payer: BLUE CROSS/BLUE SHIELD | Admitting: Psychology

## 2018-12-09 DIAGNOSIS — F4310 Post-traumatic stress disorder, unspecified: Secondary | ICD-10-CM

## 2018-12-14 ENCOUNTER — Ambulatory Visit: Payer: BLUE CROSS/BLUE SHIELD | Admitting: Psychology

## 2018-12-16 ENCOUNTER — Ambulatory Visit (INDEPENDENT_AMBULATORY_CARE_PROVIDER_SITE_OTHER): Payer: BLUE CROSS/BLUE SHIELD | Admitting: Psychology

## 2018-12-16 DIAGNOSIS — F4310 Post-traumatic stress disorder, unspecified: Secondary | ICD-10-CM

## 2018-12-23 ENCOUNTER — Ambulatory Visit: Payer: BLUE CROSS/BLUE SHIELD | Admitting: Psychology

## 2018-12-27 ENCOUNTER — Ambulatory Visit (INDEPENDENT_AMBULATORY_CARE_PROVIDER_SITE_OTHER): Payer: BLUE CROSS/BLUE SHIELD | Admitting: Psychology

## 2018-12-27 DIAGNOSIS — F4310 Post-traumatic stress disorder, unspecified: Secondary | ICD-10-CM

## 2019-01-06 ENCOUNTER — Ambulatory Visit (INDEPENDENT_AMBULATORY_CARE_PROVIDER_SITE_OTHER): Payer: BLUE CROSS/BLUE SHIELD | Admitting: Psychology

## 2019-01-06 DIAGNOSIS — F4310 Post-traumatic stress disorder, unspecified: Secondary | ICD-10-CM

## 2019-01-13 ENCOUNTER — Ambulatory Visit (INDEPENDENT_AMBULATORY_CARE_PROVIDER_SITE_OTHER): Payer: BLUE CROSS/BLUE SHIELD

## 2019-01-13 ENCOUNTER — Other Ambulatory Visit: Payer: Self-pay

## 2019-01-13 ENCOUNTER — Ambulatory Visit (INDEPENDENT_AMBULATORY_CARE_PROVIDER_SITE_OTHER): Payer: BLUE CROSS/BLUE SHIELD | Admitting: Psychology

## 2019-01-13 DIAGNOSIS — F410 Panic disorder [episodic paroxysmal anxiety] without agoraphobia: Secondary | ICD-10-CM

## 2019-01-13 DIAGNOSIS — Z3042 Encounter for surveillance of injectable contraceptive: Secondary | ICD-10-CM | POA: Diagnosis not present

## 2019-01-13 MED ORDER — MEDROXYPROGESTERONE ACETATE 150 MG/ML IM SUSP
150.0000 mg | Freq: Once | INTRAMUSCULAR | Status: AC
  Administered 2019-01-13: 150 mg via INTRAMUSCULAR

## 2019-01-13 NOTE — Progress Notes (Signed)
Depo Provera given in Left Deltoid

## 2019-01-20 ENCOUNTER — Ambulatory Visit (INDEPENDENT_AMBULATORY_CARE_PROVIDER_SITE_OTHER): Payer: BLUE CROSS/BLUE SHIELD | Admitting: Psychology

## 2019-01-20 DIAGNOSIS — F4310 Post-traumatic stress disorder, unspecified: Secondary | ICD-10-CM

## 2019-01-27 ENCOUNTER — Ambulatory Visit: Payer: Self-pay | Admitting: Psychology

## 2019-02-03 ENCOUNTER — Ambulatory Visit (INDEPENDENT_AMBULATORY_CARE_PROVIDER_SITE_OTHER): Payer: BLUE CROSS/BLUE SHIELD | Admitting: Psychology

## 2019-02-03 DIAGNOSIS — F4310 Post-traumatic stress disorder, unspecified: Secondary | ICD-10-CM

## 2019-02-10 ENCOUNTER — Ambulatory Visit (INDEPENDENT_AMBULATORY_CARE_PROVIDER_SITE_OTHER): Payer: BLUE CROSS/BLUE SHIELD | Admitting: Psychology

## 2019-02-10 DIAGNOSIS — F4310 Post-traumatic stress disorder, unspecified: Secondary | ICD-10-CM | POA: Diagnosis not present

## 2019-02-24 ENCOUNTER — Ambulatory Visit (INDEPENDENT_AMBULATORY_CARE_PROVIDER_SITE_OTHER): Payer: BLUE CROSS/BLUE SHIELD | Admitting: Psychology

## 2019-02-24 DIAGNOSIS — F4310 Post-traumatic stress disorder, unspecified: Secondary | ICD-10-CM | POA: Diagnosis not present

## 2019-03-10 ENCOUNTER — Ambulatory Visit: Payer: Self-pay | Admitting: Psychology

## 2019-03-17 ENCOUNTER — Ambulatory Visit: Payer: Self-pay | Admitting: Psychology

## 2019-03-24 ENCOUNTER — Ambulatory Visit (INDEPENDENT_AMBULATORY_CARE_PROVIDER_SITE_OTHER): Payer: Self-pay | Admitting: Psychology

## 2019-03-24 ENCOUNTER — Telehealth: Payer: Self-pay

## 2019-03-24 DIAGNOSIS — F4310 Post-traumatic stress disorder, unspecified: Secondary | ICD-10-CM

## 2019-03-24 NOTE — Telephone Encounter (Signed)
Pt calling triage requesting medication for a cold sore. Please advise

## 2019-03-27 ENCOUNTER — Other Ambulatory Visit: Payer: Self-pay | Admitting: Obstetrics and Gynecology

## 2019-03-27 ENCOUNTER — Encounter: Payer: Self-pay | Admitting: Obstetrics and Gynecology

## 2019-03-27 MED ORDER — VALACYCLOVIR HCL 1 G PO TABS: ORAL_TABLET | ORAL | 1 refills | Status: DC

## 2019-03-27 NOTE — Telephone Encounter (Signed)
Rx valtrex eRxd. F/u prn.

## 2019-03-27 NOTE — Progress Notes (Signed)
Rx valtrex for cold sores.

## 2019-03-27 NOTE — Telephone Encounter (Signed)
Called pt, no answer, left msg that rx was sent to pharmacy (did not mention the name of rx).

## 2019-03-31 ENCOUNTER — Ambulatory Visit: Payer: Self-pay | Admitting: Psychology

## 2019-04-03 ENCOUNTER — Other Ambulatory Visit: Payer: Self-pay | Admitting: Obstetrics and Gynecology

## 2019-04-05 ENCOUNTER — Telehealth: Payer: Self-pay

## 2019-04-05 NOTE — Telephone Encounter (Signed)
Patient has an appointment Friday for Depo injection. She is inquiring whether we call the pharmacy or if she needs to call her pharmacy for refiill. GU#[phone]

## 2019-04-05 NOTE — Telephone Encounter (Signed)
Spoke w/patient. Advised her Depo was prescribed 07/2018 #1 w/3 rf. She should still have refills available at pharmacy which she will need to contact them to alert them to fill so that she can p/u & bring with her to her appointment. We only contact pharmacy if her rx has expired or she has used up entire rx & refills.

## 2019-04-07 ENCOUNTER — Other Ambulatory Visit: Payer: Self-pay

## 2019-04-07 ENCOUNTER — Ambulatory Visit (INDEPENDENT_AMBULATORY_CARE_PROVIDER_SITE_OTHER): Payer: BLUE CROSS/BLUE SHIELD

## 2019-04-07 DIAGNOSIS — Z3042 Encounter for surveillance of injectable contraceptive: Secondary | ICD-10-CM | POA: Diagnosis not present

## 2019-04-07 MED ORDER — MEDROXYPROGESTERONE ACETATE 150 MG/ML IM SUSP
150.0000 mg | Freq: Once | INTRAMUSCULAR | Status: AC
  Administered 2019-04-07: 150 mg via INTRAMUSCULAR

## 2019-04-07 NOTE — Progress Notes (Signed)
Pt here for depo which was given IM right deltoid. NDC# [phone]

## 2019-04-21 DIAGNOSIS — Z20828 Contact with and (suspected) exposure to other viral communicable diseases: Secondary | ICD-10-CM | POA: Diagnosis not present

## 2019-04-25 NOTE — Progress Notes (Deleted)
Donald Prose, MD   No chief complaint on file.   HPI:      Ms. Emily Hernandez is a 23 y.o. G1P0010 who LMP was No LMP recorded., presents today for ***    Past Medical History:  Diagnosis Date  . Cold sore   . Idiopathic intracranial hypertension   . Vaccine for human papilloma virus (HPV) types 6, 11, 16, and 18 administered     Past Surgical History:  Procedure Laterality Date  . none      Family History  Problem Relation Age of Onset  . GI Bleed Maternal Grandmother   . Heart attack Maternal Grandmother   . Healthy Brother   . Breast cancer Neg Hx   . Ovarian cancer Neg Hx   . Colon cancer Neg Hx   . Diabetes Neg Hx     Social History   Socioeconomic History  . Marital status: Single    Spouse name: Not on file  . Number of children: 0  . Years of education: Not on file  . Highest education level: Not on file  Occupational History  . Occupation: Psychologist, prison and probation services Needs  . Financial resource strain: Not on file  . Food insecurity    Worry: Not on file    Inability: Not on file  . Transportation needs    Medical: Not on file    Non-medical: Not on file  Tobacco Use  . Smoking status: Current Every Day Smoker    Packs/day: 0.25    Years: 2.00    Pack years: 0.50    Types: Cigarettes    Last attempt to quit: 02/21/2018    Years since quitting: 1.1  . Smokeless tobacco: Current User    Last attempt to quit: 08/17/2012  Substance and Sexual Activity  . Alcohol use: Yes  . Drug use: No  . Sexual activity: Yes    Birth control/protection: None  Lifestyle  . Physical activity    Days per week: Not on file    Minutes per session: Not on file  . Stress: Not on file  Relationships  . Social Herbalist on phone: Not on file    Gets together: Not on file    Attends religious service: Not on file    Active member of club or organization: Not on file    Attends meetings of clubs or organizations: Not on file    Relationship status: Not on  file  . Intimate partner violence    Fear of current or ex partner: Not on file    Emotionally abused: Not on file    Physically abused: Not on file    Forced sexual activity: Not on file  Other Topics Concern  . 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    Outpatient Medications Prior to Visit  Medication Sig Dispense Refill  . medroxyPROGESTERone Acetate 150 MG/ML SUSY Inject 1 mL (150 mg total) into the muscle once for 1 dose. 1 Syringe 3  . valACYclovir (VALTREX) 1000 MG tablet Take 2 tabs BID x 1 day prn sx 30 tablet 1   No facility-administered medications prior to visit.       ROS:  Review of Systems BREAST: No symptoms   OBJECTIVE:   Vitals:  There were no vitals taken for this visit.  Physical Exam  Results: No results found for this or any previous visit (from the past 24 hour(s)).   Assessment/Plan: No diagnosis found.    No orders of the defined types were placed in this encounter.     No follow-ups on file.   B. , PA-C 04/25/2019 3:02 PM

## 2019-04-26 ENCOUNTER — Ambulatory Visit: Payer: BLUE CROSS/BLUE SHIELD | Admitting: Obstetrics and Gynecology

## 2019-05-01 ENCOUNTER — Emergency Department
Admission: EM | Admit: 2019-05-01 | Discharge: 2019-05-01 | Disposition: A | Discharge status: Home / Self Care | Payer: BLUE CROSS/BLUE SHIELD | Attending: Emergency Medicine | Admitting: Emergency Medicine

## 2019-05-01 ENCOUNTER — Encounter: Payer: Self-pay | Admitting: Emergency Medicine

## 2019-05-01 ENCOUNTER — Other Ambulatory Visit: Payer: Self-pay

## 2019-05-01 ENCOUNTER — Emergency Department: Payer: BLUE CROSS/BLUE SHIELD

## 2019-05-01 DIAGNOSIS — R1031 Right lower quadrant pain: Secondary | ICD-10-CM | POA: Diagnosis not present

## 2019-05-01 DIAGNOSIS — F17220 Nicotine dependence, chewing tobacco, uncomplicated: Secondary | ICD-10-CM | POA: Insufficient documentation

## 2019-05-01 DIAGNOSIS — I10 Essential (primary) hypertension: Secondary | ICD-10-CM | POA: Diagnosis not present

## 2019-05-01 DIAGNOSIS — Z03818 Encounter for observation for suspected exposure to other biological agents ruled out: Secondary | ICD-10-CM | POA: Diagnosis not present

## 2019-05-01 DIAGNOSIS — R109 Unspecified abdominal pain: Secondary | ICD-10-CM | POA: Diagnosis not present

## 2019-05-01 DIAGNOSIS — Z79899 Other long term (current) drug therapy: Secondary | ICD-10-CM | POA: Diagnosis not present

## 2019-05-01 DIAGNOSIS — F17210 Nicotine dependence, cigarettes, uncomplicated: Secondary | ICD-10-CM | POA: Insufficient documentation

## 2019-05-01 DIAGNOSIS — N390 Urinary tract infection, site not specified: Secondary | ICD-10-CM | POA: Diagnosis not present

## 2019-05-01 DIAGNOSIS — R399 Unspecified symptoms and signs involving the genitourinary system: Secondary | ICD-10-CM | POA: Diagnosis not present

## 2019-05-01 DIAGNOSIS — Z20828 Contact with and (suspected) exposure to other viral communicable diseases: Secondary | ICD-10-CM | POA: Diagnosis not present

## 2019-05-01 LAB — CBC
HCT: 44.3 % (ref 36.0–46.0)
Hemoglobin: 14.2 g/dL (ref 12.0–15.0)
MCH: 29.1 pg (ref 26.0–34.0)
MCHC: 32.1 g/dL (ref 30.0–36.0)
MCV: 90.8 fL (ref 80.0–100.0)
Platelets: 183 10*3/uL (ref 150–400)
RBC: 4.88 MIL/uL (ref 3.87–5.11)
RDW: 11.9 % (ref 11.5–15.5)
WBC: 6.3 10*3/uL (ref 4.0–10.5)
nRBC: 0 % (ref 0.0–0.2)

## 2019-05-01 LAB — COMPREHENSIVE METABOLIC PANEL
ALT: 15 U/L (ref 0–44)
AST: 14 U/L — ABNORMAL LOW (ref 15–41)
Albumin: 4.2 g/dL (ref 3.5–5.0)
Alkaline Phosphatase: 43 U/L (ref 38–126)
Anion gap: 10 (ref 5–15)
BUN: 13 mg/dL (ref 6–20)
CO2: 22 mmol/L (ref 22–32)
Calcium: 9.2 mg/dL (ref 8.9–10.3)
Chloride: 106 mmol/L (ref 98–111)
Creatinine, Ser: 0.85 mg/dL (ref 0.44–1.00)
GFR calc Af Amer: >60 mL/min (ref >60)
GFR calc non Af Amer: >60 mL/min (ref >60)
Glucose, Bld: 94 mg/dL (ref 70–99)
Potassium: 4 mmol/L (ref 3.5–5.1)
Sodium: 138 mmol/L (ref 135–145)
Total Bilirubin: 0.5 mg/dL (ref 0.3–1.2)
Total Protein: 7.5 g/dL (ref 6.5–8.1)

## 2019-05-01 LAB — URINALYSIS, COMPLETE (UACMP) WITH MICROSCOPIC
Bacteria, UA: NONE SEEN
Bilirubin Urine: NEGATIVE
Glucose, UA: NEGATIVE mg/dL
Ketones, ur: 20 mg/dL — AB
Nitrite: NEGATIVE
Protein, ur: NEGATIVE mg/dL
Specific Gravity, Urine: 1.029 (ref 1.005–1.030)
pH: 5 (ref 5.0–8.0)

## 2019-05-01 LAB — LIPASE, BLOOD: Lipase: 26 U/L (ref 11–51)

## 2019-05-01 LAB — POCT PREGNANCY, URINE: Preg Test, Ur: NEGATIVE

## 2019-05-01 MED ORDER — NITROFURANTOIN MONOHYD MACRO 100 MG PO CAPS: 100.0000 mg | ORAL_CAPSULE | Freq: Two times a day (BID) | ORAL | 0 refills | Status: AC

## 2019-05-01 MED ORDER — IOHEXOL 300 MG/ML  SOLN
100.0000 mL | Freq: Once | INTRAMUSCULAR | Status: AC | PRN
  Administered 2019-05-01: 100 mL via INTRAVENOUS
  Filled 2019-05-01: qty 100

## 2019-05-01 MED ORDER — NAPROXEN 500 MG PO TABS: 500.0000 mg | ORAL_TABLET | Freq: Two times a day (BID) | ORAL | 2 refills | Status: DC

## 2019-05-01 MED ORDER — SODIUM CHLORIDE 0.9% FLUSH: 3.0000 mL | Freq: Once | INTRAVENOUS | Status: DC

## 2019-05-01 NOTE — ED Notes (Signed)
Patient transported to CT 

## 2019-05-01 NOTE — ED Provider Notes (Signed)
[facility] Emergency Department Provider Note   ____________________________________________    I have reviewed the triage vital signs and the nursing notes.   HISTORY  Chief Complaint Abdominal Pain     HPI Emily Hernandez is a 23 y.o. female who presents with complaints of right lower quadrant pain.  Patient describes this pain is been ongoing for about 2 days, has been mostly constant.  She is concerned that she may have appendicitis.  She denies vaginal discharge, some minimal dysuria.  Also reports that she has been achy and woke up in a cold sweat last night.  Does not believe that she has had a fever.  Some nausea no vomiting, normal stools  Past Medical History:  Diagnosis Date  . Cold sore   . Idiopathic intracranial hypertension   . Vaccine for human papilloma virus (HPV) types 6, 11, 16, and 18 administered     Patient Active Problem List   Diagnosis Date Noted  . Idiopathic intracranial hypertension 02/20/2014    Past Surgical History:  Procedure Laterality Date  . none      Prior to Admission medications   Medication Sig Start Date End Date Taking? Authorizing Provider  medroxyPROGESTERone Acetate 150 MG/ML SUSY Inject 1 mL (150 mg total) into the muscle once for 1 dose. 07/26/18 [id number]  Copland, Deirdre Evener, PA-C  naproxen (NAPROSYN) 500 MG tablet Take 1 tablet (500 mg total) by mouth 2 (two) times daily with a meal. 05/01/19   Lavonia Drafts, MD  nitrofurantoin, macrocrystal-monohydrate, (MACROBID) 100 MG capsule Take 1 capsule (100 mg total) by mouth 2 (two) times daily for 7 days. 05/01/19 05/08/19  Lavonia Drafts, MD  valACYclovir (VALTREX) 1000 MG tablet Take 2 tabs BID x 1 day prn sx 0000000   Copland, Alicia B, PA-C     Allergies Penicillins  Family History  Problem Relation Age of Onset  . GI Bleed Maternal Grandmother   . Heart attack Maternal Grandmother   . Healthy Brother   . Breast cancer Neg Hx   . Ovarian  cancer Neg Hx   . Colon cancer Neg Hx   . Diabetes Neg Hx     Social History Social History   Tobacco Use  . Smoking status: Current Every Day Smoker    Packs/day: 0.25    Years: 2.00    Pack years: 0.50    Types: Cigarettes    Last attempt to quit: 02/21/2018    Years since quitting: 1.1  . Smokeless tobacco: Current User    Last attempt to quit: 08/17/2012  Substance Use Topics  . Alcohol use: Yes  . Drug use: No    Review of Systems  Constitutional: Positive chills Eyes: No visual changes.  ENT: No sore throat. Cardiovascular: Denies chest pain. Respiratory: Denies shortness of breath. Gastrointestinal: As above Genitourinary: As above Musculoskeletal: Negative for back pain. Skin: Negative for rash. Neurological: Negative for headaches    ____________________________________________   PHYSICAL EXAM:  VITAL SIGNS: ED Triage Vitals [05/01/19 1300]  Enc Vitals Group     BP 118/70     Pulse Rate 98     Resp 18     Temp 99.1 F (37.3 C)     Temp Source Oral     SpO2 99 %     Weight 83.9 kg (185 lb)     Height 1.6 m (5\' 3" )     Head Circumference      Peak Flow  Pain Score 6     Pain Loc      Pain Edu?      Excl. in [place]?     Constitutional: Alert and oriented.  Eyes: Conjunctivae are normal.   Nose: No congestion/rhinnorhea. Mouth/Throat: Mucous membranes are moist.    Cardiovascular: Normal rate, regular rhythm.  Good peripheral circulation. Respiratory: Normal respiratory effort.  No retractions.  Gastrointestinal: Mild tenderness in the right lower quadrant.  Soft No distention.  No CVA tenderness.  Musculoskeletal:   Warm and well perfused Neurologic:  Normal speech and language. No gross focal neurologic deficits are appreciated.  Skin:  Skin is warm, dry and intact. No rash noted. Psychiatric: Mood and affect are normal. Speech and behavior are normal.  ____________________________________________   LABS (all labs ordered are listed,  but only abnormal results are displayed)  Labs Reviewed  COMPREHENSIVE METABOLIC PANEL - Abnormal; Notable for the following components:      Result Value   AST 14 (*)    All other components within normal limits  URINALYSIS, COMPLETE (UACMP) WITH MICROSCOPIC - Abnormal; Notable for the following components:   Color, Urine YELLOW (*)    APPearance HAZY (*)    Hgb urine dipstick SMALL (*)    Ketones, ur 20 (*)    Leukocytes,Ua MODERATE (*)    All other components within normal limits  SARS CORONAVIRUS 2 (TAT 6-24 HRS)  LIPASE, BLOOD  CBC  POC URINE PREG, ED  POCT PREGNANCY, URINE   ____________________________________________  EKG  None ____________________________________________  RADIOLOGY  CT scan reassuring, possible mesenteric adenitis ____________________________________________   PROCEDURES  Procedure(s) performed: No  Procedures   Critical Care performed: No ____________________________________________   INITIAL IMPRESSION / ASSESSMENT AND PLAN / ED COURSE  Pertinent labs & imaging results that were available during my care of the patient were reviewed by me and considered in my medical decision making (see chart for details).  Patient presents with right lower quadrant pain as described above, differential includes appendicitis, urinary tract infection, ureterolithiasis, ovarian cyst.  Lab work is overall reassuring, urinalysis suspicious for urinary tract infection, CT scan demonstrates lymph nodes possibly consistent with mesenteric adenitis.  Given her history of waking up in the cold sweat and chills coronavirus test sent, appropriate for discharge at this time with antibiotics, outpatient follow-up, recommend quarantining until test results    ____________________________________________   FINAL CLINICAL IMPRESSION(S) / ED DIAGNOSES  Final diagnoses:  Right lower quadrant abdominal pain  Lower urinary tract infectious disease        Note:   This document was prepared using Dragon voice recognition software and may include unintentional dictation errors.   Lavonia Drafts, MD 05/01/19 [phone]

## 2019-05-01 NOTE — ED Triage Notes (Signed)
Lower abdominal pain x2 days.

## 2019-05-02 LAB — SARS CORONAVIRUS 2 (TAT 6-24 HRS): SARS Coronavirus 2: NEGATIVE

## 2019-05-05 ENCOUNTER — Ambulatory Visit: Payer: Self-pay | Admitting: Psychology

## 2019-06-23 ENCOUNTER — Other Ambulatory Visit: Payer: Self-pay | Admitting: Obstetrics and Gynecology

## 2019-06-23 DIAGNOSIS — Z30013 Encounter for initial prescription of injectable contraceptive: Secondary | ICD-10-CM

## 2019-06-30 ENCOUNTER — Other Ambulatory Visit: Payer: Self-pay | Admitting: Obstetrics and Gynecology

## 2019-06-30 ENCOUNTER — Ambulatory Visit: Payer: BLUE CROSS/BLUE SHIELD

## 2019-06-30 DIAGNOSIS — Z30013 Encounter for initial prescription of injectable contraceptive: Secondary | ICD-10-CM

## 2019-06-30 NOTE — Telephone Encounter (Signed)
Patient needs depo refill, annual scheduled 12/15 with [facility].

## 2019-06-30 NOTE — Telephone Encounter (Signed)
RF sent.

## 2019-07-04 ENCOUNTER — Ambulatory Visit (INDEPENDENT_AMBULATORY_CARE_PROVIDER_SITE_OTHER): Payer: BLUE CROSS/BLUE SHIELD

## 2019-07-04 ENCOUNTER — Other Ambulatory Visit: Payer: Self-pay

## 2019-07-04 DIAGNOSIS — Z3042 Encounter for surveillance of injectable contraceptive: Secondary | ICD-10-CM

## 2019-07-04 MED ORDER — MEDROXYPROGESTERONE ACETATE 150 MG/ML IM SUSP
150.0000 mg | Freq: Once | INTRAMUSCULAR | Status: AC
  Administered 2019-07-04: 15:00:00 150 mg via INTRAMUSCULAR

## 2019-07-04 NOTE — Progress Notes (Signed)
Patient presents today for Depo Provera injection within dates. Given IM LDeltoid Patient tolerated well.

## 2019-07-06 ENCOUNTER — Other Ambulatory Visit: Payer: Self-pay

## 2019-07-06 DIAGNOSIS — Z20822 Contact with and (suspected) exposure to covid-19: Secondary | ICD-10-CM

## 2019-07-08 LAB — NOVEL CORONAVIRUS, NAA: SARS-CoV-2, NAA: NOT DETECTED

## 2019-08-01 ENCOUNTER — Other Ambulatory Visit (HOSPITAL_COMMUNITY)
Admission: RE | Admit: 2019-08-01 | Discharge: 2019-08-01 | Disposition: A | Discharge status: Home / Self Care | Payer: BLUE CROSS/BLUE SHIELD | Source: Ambulatory Visit | Attending: Obstetrics and Gynecology | Admitting: Obstetrics and Gynecology

## 2019-08-01 ENCOUNTER — Ambulatory Visit (INDEPENDENT_AMBULATORY_CARE_PROVIDER_SITE_OTHER): Payer: BLUE CROSS/BLUE SHIELD | Admitting: Obstetrics and Gynecology

## 2019-08-01 ENCOUNTER — Encounter: Payer: Self-pay | Admitting: Obstetrics and Gynecology

## 2019-08-01 ENCOUNTER — Other Ambulatory Visit: Payer: Self-pay

## 2019-08-01 VITALS — BP 108/70 | Ht 63.0 in | Wt 191.0 lb

## 2019-08-01 DIAGNOSIS — Z01419 Encounter for gynecological examination (general) (routine) without abnormal findings: Secondary | ICD-10-CM | POA: Diagnosis not present

## 2019-08-01 DIAGNOSIS — Z113 Encounter for screening for infections with a predominantly sexual mode of transmission: Secondary | ICD-10-CM | POA: Insufficient documentation

## 2019-08-01 DIAGNOSIS — Z3042 Encounter for surveillance of injectable contraceptive: Secondary | ICD-10-CM

## 2019-08-01 MED ORDER — MEDROXYPROGESTERONE ACETATE 150 MG/ML IM SUSY: 150.0000 mg | PREFILLED_SYRINGE | Freq: Once | INTRAMUSCULAR | 3 refills | Status: DC

## 2019-08-01 NOTE — Progress Notes (Addendum)
PCP:  Donald Prose, MD   Chief Complaint  Patient presents with  . Gynecologic Exam     HPI:      Ms. Emily Hernandez is a 23 y.o. G1P0 who LMP was No LMP recorded. Patient has had an injection., presents today for her annual examination.  Her menses are absent with depo. No dysmen, no intermenstrual bleeding.  Sex activity: single partner, contraception - depo. Last Pap: 07/26/18 Results: no abnormalities Hx of STDs: none (but had sex contact with person who had herpes; pt never had sx)  There is no FH of breast cancer. There is no FH of ovarian cancer. The patient does not do self-breast exams.  Tobacco use: vapes daily, knows she should quit Alcohol use: social drinker No drug use.  Exercise: not active  She does get adequate calcium but not Vitamin D in her diet. Gard completed.    Past Medical History:  Diagnosis Date  . Cold sore   . Idiopathic intracranial hypertension   . Vaccine for human papilloma virus (HPV) types 6, 11, 16, and 18 administered     Past Surgical History:  Procedure Laterality Date  . none      Family History  Problem Relation Age of Onset  . GI Bleed Maternal Grandmother   . Heart attack Maternal Grandmother   . Healthy Brother   . Breast cancer Neg Hx   . Ovarian cancer Neg Hx   . Colon cancer Neg Hx   . Diabetes Neg Hx     Social History   Socioeconomic History  . Marital status: Single    Spouse name: Not on file  . Number of children: 0  . Years of education: Not on file  . Highest education level: Not on file  Occupational History  . Occupation: server  Tobacco Use  . Smoking status: Current Every Day Smoker    Packs/day: 0.25    Years: 2.00    Pack years: 0.50    Types: Cigarettes    Last attempt to quit: 02/21/2018    Years since quitting: 1.4  . Smokeless tobacco: Current User    Last attempt to quit: 08/17/2012  Substance and Sexual Activity  . Alcohol use: Yes  . Drug use: No  . Sexual activity: Yes   Birth control/protection: Injection  Other Topics Concern  . 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   Social Determinants of Health   Financial Resource Strain:   . Difficulty of Paying Living Expenses: Not on file  Food Insecurity:   . Worried About Charity fundraiser in the Last Year: Not on file  . Ran Out of Food in the Last Year: Not on file  Transportation Needs:   . Lack of Transportation (Medical): Not on file  . Lack of Transportation (Non-Medical): Not on file  Physical Activity:   . Days of Exercise per Week: Not on file  . Minutes of Exercise per Session: Not on file  Stress:   . Feeling of Stress : Not on file  Social Connections:   . Frequency of Communication with Friends and Family: Not on file  . Frequency of Social Gatherings with Friends and Family: Not on file  . Attends Religious Services: Not on file  . Active Member of Clubs or Organizations: Not on file  .  Attends Archivist Meetings: Not on file  . Marital Status: Not on file  Intimate Partner Violence:   . Fear of Current or Ex-Partner: Not on file  . Emotionally Abused: Not on file  . Physically Abused: Not on file  . Sexually Abused: Not on file    Outpatient Medications Prior to Visit  Medication Sig Dispense Refill  . valACYclovir (VALTREX) 1000 MG tablet Take 2 tabs BID x 1 day prn sx 30 tablet 1  . medroxyPROGESTERone Acetate 150 MG/ML SUSY INJECT 1 ML (150 MG TOTAL) INTO THE MUSCLE ONCE FOR 1 DOSE. 1 mL 0  . naproxen (NAPROSYN) 500 MG tablet Take 1 tablet (500 mg total) by mouth 2 (two) times daily with a meal. 20 tablet 2   No facility-administered medications prior to visit.      ROS:  Review of Systems  Constitutional: Positive for fatigue. Negative for fever and unexpected weight change.  Respiratory: Negative for cough, shortness of breath  and wheezing.   Cardiovascular: Negative for chest pain, palpitations and leg swelling.  Gastrointestinal: Negative for blood in stool, constipation, diarrhea, nausea and vomiting.  Endocrine: Negative for cold intolerance, heat intolerance and polyuria.  Genitourinary: Negative for dyspareunia, dysuria, flank pain, frequency, genital sores, hematuria, menstrual problem, pelvic pain, urgency, vaginal bleeding, vaginal discharge and vaginal pain.  Musculoskeletal: Negative for back pain, joint swelling and myalgias.  Skin: Negative for rash.  Neurological: Positive for headaches. Negative for dizziness, syncope, light-headedness and numbness.  Hematological: Negative for adenopathy.  Psychiatric/Behavioral: Positive for agitation and dysphoric mood. Negative for confusion, sleep disturbance and suicidal ideas. The patient is not nervous/anxious.   BREAST: No symptoms   Objective: BP 108/70   Ht 5\' 3"  (1.6 m)   Wt 191 lb (86.6 kg)   BMI 33.83 kg/m    Physical Exam Constitutional:      Appearance: She is well-developed.  Genitourinary:     Vulva, vagina, uterus, right adnexa and left adnexa normal.     No vulval lesion or tenderness noted.     No vaginal discharge, erythema or tenderness.     No cervical motion tenderness or polyp.     Uterus is not enlarged or tender.     No right or left adnexal mass present.     Right adnexa not tender.     Left adnexa not tender.  Neck:     Thyroid: No thyromegaly.  Cardiovascular:     Rate and Rhythm: Normal rate and regular rhythm.     Heart sounds: Normal heart sounds. No murmur.  Pulmonary:     Effort: Pulmonary effort is normal.     Breath sounds: Normal breath sounds.  Chest:     Breasts:        Right: No mass, nipple discharge, skin change or tenderness.        Left: No mass, nipple discharge, skin change or tenderness.  Abdominal:     Palpations: Abdomen is soft.     Tenderness: There is no abdominal tenderness. There is no  guarding.  Musculoskeletal:        General: Normal range of motion.     Cervical back: Normal range of motion.  Neurological:     General: No focal deficit present.     Mental Status: She is alert and oriented to person, place, and time.     Cranial Nerves: No cranial nerve deficit.  Skin:    General: Skin is warm and dry.  Psychiatric:  Mood and Affect: Mood normal.        Behavior: Behavior normal.        Thought Content: Thought content normal.        Judgment: Judgment normal.  Vitals reviewed.     Assessment/Plan: Encounter for annual routine gynecological examination  Screening for STD (sexually transmitted disease)  Encounter for surveillance of injectable contraceptive - Plan: medroxyPROGESTERone Acetate 150 MG/ML SUSY; Depo RF. Cont ca/add vit D  Meds ordered this encounter  Medications  . medroxyPROGESTERone Acetate 150 MG/ML SUSY    Sig: Inject 1 mL (150 mg total) into the muscle once for 1 dose.    Dispense:  1 mL    Refill:  3    Order Specific Question:   Supervising Provider    Answer:   Gae Dry [id number]             GYN counsel adequate intake of calcium and vitamin D, diet and exercise     F/U  Return in about 1 year (around 07/31/2020).  Tremel Setters B. Daniella Dewberry, PA-C 08/01/2019 3:56 PM

## 2019-08-01 NOTE — Patient Instructions (Signed)
I value your feedback and entrusting us with your care. If you get a [facility] patient survey, I would appreciate you taking the time to let us know about your experience today. Thank you!  As of July 27, 2019, your lab results will be released to your MyChart immediately, before I even have a chance to see them. Please give me time to review them and contact you if there are any abnormalities. Thank you for your patience.  

## 2019-08-01 NOTE — Addendum Note (Signed)
Addended by: Ardeth Perfect B on: [id number] 03:56 PM   Modules accepted: Orders

## 2019-08-03 LAB — CERVICOVAGINAL ANCILLARY ONLY
Chlamydia: NEGATIVE
Comment: NEGATIVE
Comment: NORMAL
Neisseria Gonorrhea: NEGATIVE

## 2019-09-04 ENCOUNTER — Other Ambulatory Visit: Payer: Self-pay | Admitting: Adult Health

## 2019-09-04 ENCOUNTER — Ambulatory Visit (INDEPENDENT_AMBULATORY_CARE_PROVIDER_SITE_OTHER): Payer: BLUE CROSS/BLUE SHIELD | Admitting: Psychology

## 2019-09-04 DIAGNOSIS — F4310 Post-traumatic stress disorder, unspecified: Secondary | ICD-10-CM | POA: Diagnosis not present

## 2019-09-04 DIAGNOSIS — F3132 Bipolar disorder, current episode depressed, moderate: Secondary | ICD-10-CM

## 2019-09-13 ENCOUNTER — Ambulatory Visit (INDEPENDENT_AMBULATORY_CARE_PROVIDER_SITE_OTHER): Payer: BLUE CROSS/BLUE SHIELD | Admitting: Adult Health

## 2019-09-13 ENCOUNTER — Other Ambulatory Visit: Payer: Self-pay

## 2019-09-13 ENCOUNTER — Encounter: Payer: Self-pay | Admitting: Adult Health

## 2019-09-13 DIAGNOSIS — G4700 Insomnia, unspecified: Secondary | ICD-10-CM

## 2019-09-13 DIAGNOSIS — F411 Generalized anxiety disorder: Secondary | ICD-10-CM | POA: Diagnosis not present

## 2019-09-13 DIAGNOSIS — F319 Bipolar disorder, unspecified: Secondary | ICD-10-CM | POA: Diagnosis not present

## 2019-09-13 DIAGNOSIS — F331 Major depressive disorder, recurrent, moderate: Secondary | ICD-10-CM | POA: Diagnosis not present

## 2019-09-13 MED ORDER — LAMOTRIGINE 25 MG PO TABS: ORAL_TABLET | ORAL | 2 refills | Status: DC

## 2019-09-13 NOTE — Progress Notes (Signed)
CHASIDEE WELSH Hernandez:[id number] Jan 23, 1996 24 y.o.  Subjective:   Patient ID:  Emily Hernandez is a 24 y.o. (DOB 16-Feb-1996) female.  Chief Complaint:  Chief Complaint  Patient presents with  . Insomnia  . Depression  . Anxiety  . Other    Bipolar Disorder    HPI ELLENORE HAMMONS presents to the office today for follow-up of MDD, GAD, insomnia, and Bipolar disorder.   Describes mood today as "not the best". Pleasant. Flat. Mood symptoms - depression, anxiety, and irritability. Stating "I'm not in the best place right now". Has not been able to see anyone for past year due to insurance. Feels like Lamictal and Topamax worked well for her in the past and would like to restart. Stable interest and motivation. Taking medications as prescribed.  Energy levels "up and down". Active, has a regular exercise routine. Works full-time. Full time student. Enjoys some usual interests and activities. Single. Lives with sister. Mother in [place].  Appetite adequate. Weight gain. Sleep varies. Averages 4 to 5 hours. Sleeping on couch because bedroom is "filthy". Focus and concentration difficulties. Completing tasks. Managing aspects of household. 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 Denies SI or HI. Denies AH or VH.  Previous medication trials: Latuda, Abilify, Lamictal, Topamax, Vraylar   Review of Systems:  Review of Systems  Musculoskeletal: Negative for gait problem.  Neurological: Negative for tremors.  Psychiatric/Behavioral:       Please refer to HPI    Medications: I have reviewed the patient's current medications.  Current Outpatient Medications  Medication Sig Dispense Refill  . lamoTRIgine (LAMICTAL) 25 MG tablet Take one tablet at bedtime for 14 days, then take two tablets at bedtime. 60 tablet 2  . medroxyPROGESTERone Acetate 150 MG/ML SUSY Inject 1 mL (150 mg total) into the muscle once for 1 dose. 1 mL 3  . valACYclovir (VALTREX) 1000 MG  tablet Take 2 tabs BID x 1 day prn sx 30 tablet 1   No current facility-administered medications for this visit.    Medication Side Effects: None  Allergies:  Allergies  Allergen Reactions  . Penicillins     Swelling and Hives    Past Medical History:  Diagnosis Date  . Cold sore   . Idiopathic intracranial hypertension   . Vaccine for human papilloma virus (HPV) types 6, 11, 16, and 18 administered     Family History  Problem Relation Age of Onset  . GI Bleed Maternal Grandmother   . Heart attack Maternal Grandmother   . Healthy Brother   . Breast cancer Neg Hx   . Ovarian cancer Neg Hx   . Colon cancer Neg Hx   . Diabetes Neg Hx     Social History   Socioeconomic History  . Marital status: Single    Spouse name: Not on file  . Number of children: 0  . Years of education: Not on file  . Highest education level: Not on file  Occupational History  . Occupation: server  Tobacco Use  . Smoking status: Current Every Day Smoker    Packs/day: 0.25    Years: 2.00    Pack years: 0.50    Types: Cigarettes    Last attempt to quit: 02/21/2018    Years since quitting: 1.5  . Smokeless tobacco: Current User    Last attempt to quit: 08/17/2012  Substance and Sexual Activity  . Alcohol use: Yes  . Drug use: No  . Sexual activity: Yes  Birth control/protection: Injection  Other Topics Concern  . 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   Social Determinants of Health   Financial Resource Strain:   . Difficulty of Paying Living Expenses: Not on file  Food Insecurity:   . Worried About Charity fundraiser in the Last Year: Not on file  . Ran Out of Food in the Last Year: Not on file  Transportation Needs:   . Lack of Transportation (Medical): Not on file  . Lack of Transportation (Non-Medical): Not on file  Physical Activity:   . Days of  Exercise per Week: Not on file  . Minutes of Exercise per Session: Not on file  Stress:   . Feeling of Stress : Not on file  Social Connections:   . Frequency of Communication with Friends and Family: Not on file  . Frequency of Social Gatherings with Friends and Family: Not on file  . Attends Religious Services: Not on file  . Active Member of Clubs or Organizations: Not on file  . Attends Archivist Meetings: Not on file  . Marital Status: Not on file  Intimate Partner Violence:   . Fear of Current or Ex-Partner: Not on file  . Emotionally Abused: Not on file  . Physically Abused: Not on file  . Sexually Abused: Not on file    Past Medical History, Surgical history, Social history, and Family history were reviewed and updated as appropriate.   Please see review of systems for further details on the patient's review from today.   Objective:   Physical Exam:  There were no vitals taken for this visit.  Physical Exam Constitutional:      General: She is not in acute distress.    Appearance: She is well-developed.  Musculoskeletal:        General: No deformity.  Neurological:     Mental Status: She is alert and oriented to person, place, and time.     Coordination: Coordination normal.  Psychiatric:        Attention and Perception: Attention and perception normal. She does not perceive auditory or visual hallucinations.        Mood and Affect: Mood is anxious and depressed. Affect is flat. Affect is not labile, blunt, angry or inappropriate.        Speech: Speech normal.        Behavior: Behavior normal.        Thought Content: Thought content normal. Thought content is not paranoid or delusional. Thought content does not include homicidal or suicidal ideation. Thought content does not include homicidal or suicidal plan.        Cognition and Memory: Cognition and memory normal.        Judgment: Judgment normal.     Comments: Insight intact     Lab Review:      Component Value Date/Time   NA 138 05/01/2019 1303   K 4.0 05/01/2019 1303   CL 106 05/01/2019 1303   CO2 22 05/01/2019 1303   GLUCOSE 94 05/01/2019 1303   BUN 13 05/01/2019 1303   CREATININE 0.85 05/01/2019 1303   CREATININE 0.75 01/30/2014 1309   CALCIUM 9.2 05/01/2019 1303   PROT 7.5 05/01/2019 1303   ALBUMIN 4.2 05/01/2019 1303   AST 14 (L) 05/01/2019 1303   ALT 15 05/01/2019 1303  ALKPHOS 43 05/01/2019 1303   BILITOT 0.5 05/01/2019 1303   GFRNONAA >60 05/01/2019 1303   GFRAA >60 05/01/2019 1303       Component Value Date/Time   WBC 6.3 05/01/2019 1303   RBC 4.88 05/01/2019 1303   HGB 14.2 05/01/2019 1303   HGB 13.4 03/04/2018 1009   HCT 44.3 05/01/2019 1303   HCT 41.2 03/04/2018 1009   PLT 183 05/01/2019 1303   PLT 233 03/04/2018 1009   MCV 90.8 05/01/2019 1303   MCV 91 03/04/2018 1009   MCH 29.1 05/01/2019 1303   MCHC 32.1 05/01/2019 1303   RDW 11.9 05/01/2019 1303   RDW 12.0 (L) 03/04/2018 1009   LYMPHSABS 1.4 03/04/2018 1009   MONOABS 0.4 09/03/2008 2035   EOSABS 0.0 03/04/2018 1009   BASOSABS 0.0 03/04/2018 1009    No results found for: POCLITH, LITHIUM   No results found for: PHENYTOIN, PHENOBARB, VALPROATE, CBMZ   .res Assessment: Plan:    Plan:  PDMP reviewed  1. Lamictal 25mg  at hx x 14 days, then 50mg  at hs 2. Will add Topamax next visit   Continue therapy with Bambi Cottle.  RTC 4 weeks  Patient advised to contact office with any questions, adverse effects, or acute worsening in signs and symptoms.  Counseled patient regarding potential benefits, risks, and side effects of Lamictal to include potential risk of Stevens-Johnson syndrome. Advised patient to stop taking Lamictal and contact office immediately if rash develops and to seek urgent medical attention if rash is severe and/or spreading quickly.   Lorenzo was seen today for insomnia, depression, anxiety and other.  Diagnoses and all orders for this visit:  Bipolar I disorder  ([place]) -     lamoTRIgine (LAMICTAL) 25 MG tablet; Take one tablet at bedtime for 14 days, then take two tablets at bedtime.  Major depressive disorder, recurrent episode, moderate (H[facility])  Generalized anxiety disorder  Insomnia, unspecified type     Please see After Visit Summary for patient specific instructions.  Future Appointments  Date Time Provider [place]  09/27/2019  2:00 PM [facility] NURSE [id number] None    No orders of the defined types were placed in this encounter.   -------------------------------

## 2019-09-19 ENCOUNTER — Ambulatory Visit: Payer: BLUE CROSS/BLUE SHIELD | Admitting: Podiatry

## 2019-09-19 ENCOUNTER — Other Ambulatory Visit: Payer: Self-pay

## 2019-09-19 ENCOUNTER — Ambulatory Visit (INDEPENDENT_AMBULATORY_CARE_PROVIDER_SITE_OTHER): Payer: BLUE CROSS/BLUE SHIELD

## 2019-09-19 DIAGNOSIS — L600 Ingrowing nail: Secondary | ICD-10-CM | POA: Diagnosis not present

## 2019-09-19 DIAGNOSIS — B351 Tinea unguium: Secondary | ICD-10-CM | POA: Diagnosis not present

## 2019-09-19 DIAGNOSIS — M84375A Stress fracture, left foot, initial encounter for fracture: Secondary | ICD-10-CM

## 2019-09-19 DIAGNOSIS — M79672 Pain in left foot: Secondary | ICD-10-CM | POA: Diagnosis not present

## 2019-09-19 MED ORDER — TERBINAFINE HCL 250 MG PO TABS: 250.0000 mg | ORAL_TABLET | Freq: Every day | ORAL | 2 refills | Status: DC

## 2019-09-19 MED ORDER — GENTAMICIN SULFATE 0.1 % EX CREA: 1.0000 "application " | TOPICAL_CREAM | Freq: Two times a day (BID) | CUTANEOUS | 1 refills | Status: DC

## 2019-09-19 NOTE — Patient Instructions (Signed)

## 2019-09-22 NOTE — Progress Notes (Signed)
   Subjective: Patient presents today for evaluation of pain to the medial border of the left great toenail that has been ongoing for the past year. She reports associated redness and swelling of the toe. Patient is concerned for possible ingrown nail. She also reports thickening, discoloration and tenderness of the right fourth toenail. Walking increases her symptoms. She has been soaking the toes in Epsom salt and trimming them herself for treatment. Patient presents today for further treatment and evaluation.  Past Medical History:  Diagnosis Date  . Cold sore   . Idiopathic intracranial hypertension   . Vaccine for human papilloma virus (HPV) types 6, 11, 16, and 18 administered     Objective:  General: Well developed, nourished, in no acute distress, alert and oriented x3   Dermatology: Skin is warm, dry and supple bilateral. Medial border of the left great toenail appears to be erythematous with evidence of an ingrowing nail. Pain on palpation noted to the border of the nail fold. Hyperkeratotic, discolored, thickened, onychodystrophy noted to the right fourth toenail. There are no open sores, lesions.  Vascular: Dorsalis Pedis artery and Posterior Tibial artery pedal pulses palpable. No lower extremity edema noted.   Neruologic: Grossly intact via light touch bilateral.  Musculoskeletal: Pain with palpation noted to the left fourth metatarsal. Muscular strength within normal limits in all groups bilateral. Normal range of motion noted to all pedal and ankle joints.   Assesement: #1 Paronychia with ingrowing nail medial border left great toe #2 Incurvated nail left great toe #3 onychomycosis right fourth toe #4 Stress fracture left fourth metatarsal based on clinical exam  Plan of Care:  1. Patient evaluated.  2. Discussed treatment alternatives and plan of care. Explained nail avulsion procedure and post procedure course to patient. 3. Patient opted for permanent partial nail  avulsion of the medial border of the left great toe.  4. Prior to procedure, local anesthesia infiltration utilized using 3 ml of a 50:50 mixture of 2% plain lidocaine and 0.5% plain marcaine in a normal hallux block fashion and a betadine prep performed.  5. Partial permanent nail avulsion with chemical matrixectomy performed using [id number] applications of phenol followed by alcohol flush.  6. Light dressing applied. 7. Prescription for Gentamicin cream provided to patient to use daily with a bandage.  8. Prescription for Lamisil 250 mg #90 provided to patient.  9. Recommended OTC Motrin as needed and reducing activity to allow left foot to heal.  10. Return to clinic in 2 weeks.  Edrick Kins, DPM [facility]  Dr. Edrick Kins, [address]                                        [place], [place] [address]                Office [phone]  Fax [phone]

## 2019-09-27 ENCOUNTER — Other Ambulatory Visit: Payer: Self-pay

## 2019-09-27 ENCOUNTER — Ambulatory Visit (INDEPENDENT_AMBULATORY_CARE_PROVIDER_SITE_OTHER): Payer: BLUE CROSS/BLUE SHIELD

## 2019-09-27 DIAGNOSIS — Z3042 Encounter for surveillance of injectable contraceptive: Secondary | ICD-10-CM

## 2019-09-27 MED ORDER — MEDROXYPROGESTERONE ACETATE 150 MG/ML IM SUSP
150.0000 mg | Freq: Once | INTRAMUSCULAR | Status: AC
  Administered 2019-09-27: 14:00:00 150 mg via INTRAMUSCULAR

## 2019-10-03 ENCOUNTER — Ambulatory Visit: Payer: BLUE CROSS/BLUE SHIELD | Admitting: Podiatry

## 2019-10-10 ENCOUNTER — Ambulatory Visit: Payer: BLUE CROSS/BLUE SHIELD | Admitting: Psychology

## 2019-10-11 ENCOUNTER — Ambulatory Visit (INDEPENDENT_AMBULATORY_CARE_PROVIDER_SITE_OTHER): Payer: BLUE CROSS/BLUE SHIELD | Admitting: Adult Health

## 2019-10-11 ENCOUNTER — Other Ambulatory Visit: Payer: Self-pay

## 2019-10-11 ENCOUNTER — Encounter: Payer: Self-pay | Admitting: Adult Health

## 2019-10-11 DIAGNOSIS — F411 Generalized anxiety disorder: Secondary | ICD-10-CM | POA: Diagnosis not present

## 2019-10-11 DIAGNOSIS — F319 Bipolar disorder, unspecified: Secondary | ICD-10-CM

## 2019-10-11 DIAGNOSIS — G4700 Insomnia, unspecified: Secondary | ICD-10-CM

## 2019-10-11 DIAGNOSIS — F331 Major depressive disorder, recurrent, moderate: Secondary | ICD-10-CM

## 2019-10-11 MED ORDER — LAMOTRIGINE 100 MG PO TABS: ORAL_TABLET | ORAL | 1 refills | Status: DC

## 2019-10-11 MED ORDER — TOPIRAMATE 25 MG PO TABS: ORAL_TABLET | ORAL | 1 refills | Status: DC

## 2019-10-11 MED ORDER — TOPIRAMATE 100 MG PO TABS: 100.0000 mg | ORAL_TABLET | Freq: Every day | ORAL | 2 refills | Status: DC

## 2019-10-11 NOTE — Progress Notes (Signed)
Emily Hernandez [id number] December 12, 1995 23 y.o.  Subjective:   Patient ID:  Emily Hernandez is a 24 y.o. (DOB May 15, 1996) female.  Chief Complaint:  Chief Complaint  Patient presents with  . Anxiety  . Depression  . Insomnia  . Other    BPD 1   HPI Emily Hernandez presents to the office today for follow-up of MDD, GAD, insomnia, and Bipolar disorder.   Describes mood today as "ok". Pleasant. Flat. Mood symptoms - reports depression, anxiety, and irritability. Stating "I can tell some difference in my mood". Reports having a "difficult month". Car broke down and had to buy another car. Mortgage has gone up by $100.00. Stating "I was expecting all the extra expenses". Missed therapy appointment yesterday. Tolerating restart of Lamictal and is ready to add in the Topamax. Improved interest and motivation. Taking medications as prescribed.  Energy levels vary. Active, has a regular exercise routine. Works full-time. Full time student. Enjoys some usual interests and activities. Single. Dating - ended a recent relationship. Lives with sister. Mother in [place].  Appetite adequate. Weight gain. Sleeps better some nights than others. Averages 5 to 6 hours. Has nightmares - sleeps on the couch. Focus and concentration improved - has gotten a Engineer, manufacturing". Completing tasks. Managing aspects of household. Works full time at [employer] - "hates it". 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 Denies SI or HI. Denies AH or VH.  Previous medication trials: Latuda, Abilify, Lamictal, Topamax, Vraylar  Review of Systems:  Review of Systems  Musculoskeletal: Negative for gait problem.  Neurological: Negative for tremors.  Psychiatric/Behavioral:       Please refer to HPI    Medications: I have reviewed the patient's current medications.  Current Outpatient Medications  Medication Sig Dispense Refill  . gentamicin cream (GARAMYCIN) 0.1 % Apply 1 application topically 2 (two) times daily. 15  g 1  . lamoTRIgine (LAMICTAL) 100 MG tablet Take one tablet at bedtime. 90 tablet 1  . medroxyPROGESTERone Acetate 150 MG/ML SUSY Inject 1 mL (150 mg total) into the muscle once for 1 dose. 1 mL 3  . terbinafine (LAMISIL) 250 MG tablet Take 1 tablet (250 mg total) by mouth daily. 30 tablet 2  . topiramate (TOPAMAX) 100 MG tablet Take 1 tablet (100 mg total) by mouth daily. 30 tablet 2  . topiramate (TOPAMAX) 25 MG tablet Take one 25mg  tablet daily x 7 days, then take two 25mg  tablets daily x 7 days, then take three 25mg  tablets x 7 days, then take four 25 mg tablets. 70 tablet 1  . valACYclovir (VALTREX) 1000 MG tablet Take 2 tabs BID x 1 day prn sx 30 tablet 1   No current facility-administered medications for this visit.    Medication Side Effects: None  Allergies:  Allergies  Allergen Reactions  . Penicillins     Swelling and Hives    Past Medical History:  Diagnosis Date  . Cold sore   . Idiopathic intracranial hypertension   . Vaccine for human papilloma virus (HPV) types 6, 11, 16, and 18 administered     Family History  Problem Relation Age of Onset  . GI Bleed Maternal Grandmother   . Heart attack Maternal Grandmother   . Healthy Brother   . Breast cancer Neg Hx   . Ovarian cancer Neg Hx   . Colon cancer Neg Hx   . Diabetes Neg Hx     Social History   Socioeconomic History  . Marital status: Single  Spouse name: Not on file  . Number of children: 0  . Years of education: Not on file  . Highest education level: Not on file  Occupational History  . Occupation: server  Tobacco Use  . Smoking status: Current Every Day Smoker    Packs/day: 0.25    Years: 2.00    Pack years: 0.50    Types: Cigarettes    Last attempt to quit: 02/21/2018    Years since quitting: 1.6  . Smokeless tobacco: Current User    Last attempt to quit: 08/17/2012  Substance and Sexual Activity  . Alcohol use: Yes  . Drug use: No  . Sexual activity: Yes    Birth control/protection:  Injection  Other Topics Concern  . 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   Social Determinants of Health   Financial Resource Strain:   . Difficulty of Paying Living Expenses: Not on file  Food Insecurity:   . Worried About Charity fundraiser in the Last Year: Not on file  . Ran Out of Food in the Last Year: Not on file  Transportation Needs:   . Lack of Transportation (Medical): Not on file  . Lack of Transportation (Non-Medical): Not on file  Physical Activity:   . Days of Exercise per Week: Not on file  . Minutes of Exercise per Session: Not on file  Stress:   . Feeling of Stress : Not on file  Social Connections:   . Frequency of Communication with Friends and Family: Not on file  . Frequency of Social Gatherings with Friends and Family: Not on file  . Attends Religious Services: Not on file  . Active Member of Clubs or Organizations: Not on file  . Attends Archivist Meetings: Not on file  . Marital Status: Not on file  Intimate Partner Violence:   . Fear of Current or Ex-Partner: Not on file  . Emotionally Abused: Not on file  . Physically Abused: Not on file  . Sexually Abused: Not on file    Past Medical History, Surgical history, Social history, and Family history were reviewed and updated as appropriate.   Please see review of systems for further details on the patient's review from today.   Objective:   Physical Exam:  There were no vitals taken for this visit.  Physical Exam Constitutional:      General: She is not in acute distress.    Appearance: She is well-developed.  Musculoskeletal:        General: No deformity.  Neurological:     Mental Status: She is alert and oriented to person, place, and time.     Coordination: Coordination normal.  Psychiatric:        Attention and Perception: Attention and  perception normal. She does not perceive auditory or visual hallucinations.        Mood and Affect: Mood is anxious and depressed. Affect is not labile, blunt, angry or inappropriate.        Speech: Speech normal.        Behavior: Behavior normal.        Thought Content: Thought content normal. Thought content is not paranoid or delusional. Thought content does not include homicidal or suicidal ideation. Thought content does not include homicidal or suicidal plan.        Cognition and Memory:  Cognition and memory normal.        Judgment: Judgment normal.     Comments: Insight intact    Lab Review:     Component Value Date/Time   NA 138 05/01/2019 1303   K 4.0 05/01/2019 1303   CL 106 05/01/2019 1303   CO2 22 05/01/2019 1303   GLUCOSE 94 05/01/2019 1303   BUN 13 05/01/2019 1303   CREATININE 0.85 05/01/2019 1303   CREATININE 0.75 01/30/2014 1309   CALCIUM 9.2 05/01/2019 1303   PROT 7.5 05/01/2019 1303   ALBUMIN 4.2 05/01/2019 1303   AST 14 (L) 05/01/2019 1303   ALT 15 05/01/2019 1303   ALKPHOS 43 05/01/2019 1303   BILITOT 0.5 05/01/2019 1303   GFRNONAA >60 05/01/2019 1303   GFRAA >60 05/01/2019 1303       Component Value Date/Time   WBC 6.3 05/01/2019 1303   RBC 4.88 05/01/2019 1303   HGB 14.2 05/01/2019 1303   HGB 13.4 03/04/2018 1009   HCT 44.3 05/01/2019 1303   HCT 41.2 03/04/2018 1009   PLT 183 05/01/2019 1303   PLT 233 03/04/2018 1009   MCV 90.8 05/01/2019 1303   MCV 91 03/04/2018 1009   MCH 29.1 05/01/2019 1303   MCHC 32.1 05/01/2019 1303   RDW 11.9 05/01/2019 1303   RDW 12.0 (L) 03/04/2018 1009   LYMPHSABS 1.4 03/04/2018 1009   MONOABS 0.4 09/03/2008 2035   EOSABS 0.0 03/04/2018 1009   BASOSABS 0.0 03/04/2018 1009    No results found for: POCLITH, LITHIUM   No results found for: PHENYTOIN, PHENOBARB, VALPROATE, CBMZ   .res Assessment: Plan:    Plan:  PDMP reviewed  1. Lamictal 75mg  at hs x 7 days, then 100mg  daily 2. Add Topamax 25 x 7, then 50mg   at hs x 7, then 75mg  at hs x 7, then 100mg  at hs  Continue therapy with Bambi Cottle.  RTC 4 weeks  Patient advised to contact office with any questions, adverse effects, or acute worsening in signs and symptoms.  Counseled patient regarding potential benefits, risks, and side effects of Lamictal to include potential risk of Stevens-Johnson syndrome. Advised patient to stop taking Lamictal and contact office immediately if rash develops and to seek urgent medical attention if rash is severe and/or spreading quickly.   Earsie was seen today for anxiety, depression, insomnia and other.  Diagnoses and all orders for this visit:  Insomnia, unspecified type  Bipolar I disorder (HCC) -     lamoTRIgine (LAMICTAL) 100 MG tablet; Take one tablet at bedtime. -     topiramate (TOPAMAX) 25 MG tablet; Take one 25mg  tablet daily x 7 days, then take two 25mg  tablets daily x 7 days, then take three 25mg  tablets x 7 days, then take four 25 mg tablets. -     topiramate (TOPAMAX) 100 MG tablet; Take 1 tablet (100 mg total) by mouth daily.  Generalized anxiety disorder  Major depressive disorder, recurrent episode, moderate ([place])     Please see After Visit Summary for patient specific instructions.  Future Appointments  Date Time Provider [place]  11/08/2019  9:00 AM Cottle, Lucious [place], LCSW [facility] None  12/19/2019  2:00 PM [facility] NURSE [facility] None    No orders of the defined types were placed in this encounter.   -------------------------------

## 2019-11-07 ENCOUNTER — Other Ambulatory Visit: Payer: Self-pay

## 2019-11-07 ENCOUNTER — Ambulatory Visit (INDEPENDENT_AMBULATORY_CARE_PROVIDER_SITE_OTHER): Payer: BLUE CROSS/BLUE SHIELD | Admitting: Adult Health

## 2019-11-07 ENCOUNTER — Other Ambulatory Visit: Payer: Self-pay | Admitting: Adult Health

## 2019-11-07 ENCOUNTER — Encounter: Payer: Self-pay | Admitting: Adult Health

## 2019-11-07 DIAGNOSIS — F319 Bipolar disorder, unspecified: Secondary | ICD-10-CM

## 2019-11-07 DIAGNOSIS — F331 Major depressive disorder, recurrent, moderate: Secondary | ICD-10-CM | POA: Diagnosis not present

## 2019-11-07 DIAGNOSIS — G4700 Insomnia, unspecified: Secondary | ICD-10-CM

## 2019-11-07 DIAGNOSIS — F411 Generalized anxiety disorder: Secondary | ICD-10-CM | POA: Diagnosis not present

## 2019-11-07 NOTE — Progress Notes (Signed)
ZYVA ZINS [id number] [birth year] 23 y.o.  Subjective:   Patient ID:  Emily Hernandez is a 24 y.o. (DOB 06-23-1996) female.  Chief Complaint: No chief complaint on file.   HPI Emily Hernandez presents to the office today for follow-up of MDD, GAD, insomnia, and Bipolar disorder.   Describes mood today as "better overall". Pleasant. Flat. Mood symptoms - reports depression, anxiety, and irritability. Stating "I've been feeling great on the medication". Moods are "more level". Has been on Lamictal 100mg  for a week. Felt like the last week has been difficult. Sister having issues. Work is stressful. Not wanting to go to school some days.  Improved interest and motivation. Taking medications as prescribed.  Energy levels vary. Active, has a regular exercise routine. Works full-time. Full time student at [employer].. Enjoys some usual interests and activities. Single. Dating. Lives with sister. Mother in [place].  Appetite adequate. Weight stable.  Sleeps better some nights than others. Averages 5 to 6 hours. Reports nightmares. Focus and concentration improved. More organized. Completing tasks. Managing aspects of household. Works full time at [employer]. 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 Denies SI or HI. Denies AH or VH.  Previous medication trials: Latuda, Abilify, Lamictal, Topamax, Vraylar  Review of Systems:  Review of Systems  Musculoskeletal: Negative for gait problem.  Neurological: Negative for tremors.  Psychiatric/Behavioral:       Please refer to HPI    Medications: I have reviewed the patient's current medications.  Current Outpatient Medications  Medication Sig Dispense Refill  . gentamicin cream (GARAMYCIN) 0.1 % Apply 1 application topically 2 (two) times daily. 15 g 1  . lamoTRIgine (LAMICTAL) 100 MG tablet Take one tablet at bedtime. 90 tablet 1  . medroxyPROGESTERone Acetate 150 MG/ML SUSY Inject 1 mL (150 mg total) into the muscle once for 1 dose. 1  mL 3  . terbinafine (LAMISIL) 250 MG tablet Take 1 tablet (250 mg total) by mouth daily. 30 tablet 2  . topiramate (TOPAMAX) 100 MG tablet Take 1 tablet (100 mg total) by mouth daily. 30 tablet 2  . topiramate (TOPAMAX) 25 MG tablet Take one 25mg  tablet daily x 7 days, then take two 25mg  tablets daily x 7 days, then take three 25mg  tablets x 7 days, then take four 25 mg tablets. 70 tablet 1  . valACYclovir (VALTREX) 1000 MG tablet Take 2 tabs BID x 1 day prn sx 30 tablet 1   No current facility-administered medications for this visit.    Medication Side Effects: None  Allergies:  Allergies  Allergen Reactions  . Penicillins     Swelling and Hives    Past Medical History:  Diagnosis Date  . Cold sore   . Idiopathic intracranial hypertension   . Vaccine for human papilloma virus (HPV) types 6, 11, 16, and 18 administered     Family History  Problem Relation Age of Onset  . GI Bleed Maternal Grandmother   . Heart attack Maternal Grandmother   . Healthy Brother   . Breast cancer Neg Hx   . Ovarian cancer Neg Hx   . Colon cancer Neg Hx   . Diabetes Neg Hx     Social History   Socioeconomic History  . Marital status: Single    Spouse name: Not on file  . Number of children: 0  . Years of education: Not on file  . Highest education level: Not on file  Occupational History  . Occupation: server  Tobacco Use  . Smoking  status: Current Every Day Smoker    Packs/day: 0.25    Years: 2.00    Pack years: 0.50    Types: Cigarettes    Last attempt to quit: 02/21/2018    Years since quitting: 1.7  . Smokeless tobacco: Current User    Last attempt to quit: 08/17/2012  Substance and Sexual Activity  . Alcohol use: Yes  . Drug use: No  . Sexual activity: Yes    Birth control/protection: Injection  Other Topics Concern  . Not on file  Social History Narrative   She is currently a Educational psychologist at [employer] level of education:  Some college   Lives with mother and  stepfather.   Social Determinants of Health   Financial Resource Strain:   . Difficulty of Paying Living Expenses:   Food Insecurity:   . Worried About Charity fundraiser in the Last Year:   . Arboriculturist in the Last Year:   Transportation Needs:   . Film/video editor (Medical):   Marland Kitchen Lack of Transportation (Non-Medical):   Physical Activity:   . Days of Exercise per Week:   . Minutes of Exercise per Session:   Stress:   . Feeling of Stress :   Social Connections:   . Frequency of Communication with Friends and Family:   . Frequency of Social Gatherings with Friends and Family:   . Attends Religious Services:   . Active Member of Clubs or Organizations:   . Attends Archivist Meetings:   Marland Kitchen Marital Status:   Intimate Partner Violence:   . Fear of Current or Ex-Partner:   . Emotionally Abused:   Marland Kitchen Physically Abused:   . Sexually Abused:     Past Medical History, Surgical history, Social history, and Family history were reviewed and updated as appropriate.   Please see review of systems for further details on the patient's review from today.   Objective:   Physical Exam:  There were no vitals taken for this visit.  Physical Exam Constitutional:      General: She is not in acute distress. Musculoskeletal:        General: No deformity.  Neurological:     Mental Status: She is alert and oriented to person, place, and time.     Coordination: Coordination normal.  Psychiatric:        Attention and Perception: Attention and perception normal. She does not perceive auditory or visual hallucinations.        Mood and Affect: Mood normal. Mood is not anxious or depressed. Affect is not labile, blunt, angry or inappropriate.        Speech: Speech normal.        Behavior: Behavior normal.        Thought Content: Thought content normal. Thought content is not paranoid or delusional. Thought content does not include homicidal or suicidal ideation. Thought content  does not include homicidal or suicidal plan.        Cognition and Memory: Cognition and memory normal.        Judgment: Judgment normal.     Comments: Insight intact     Lab Review:     Component Value Date/Time   NA 138 05/01/2019 1303   K 4.0 05/01/2019 1303   CL 106 05/01/2019 1303   CO2 22 05/01/2019 1303   GLUCOSE 94 05/01/2019 1303   BUN 13 05/01/2019 1303   CREATININE 0.85 05/01/2019 1303   CREATININE 0.75 01/30/2014  1309   CALCIUM 9.2 05/01/2019 1303   PROT 7.5 05/01/2019 1303   ALBUMIN 4.2 05/01/2019 1303   AST 14 (L) 05/01/2019 1303   ALT 15 05/01/2019 1303   ALKPHOS 43 05/01/2019 1303   BILITOT 0.5 05/01/2019 1303   GFRNONAA >60 05/01/2019 1303   GFRAA >60 05/01/2019 1303       Component Value Date/Time   WBC 6.3 05/01/2019 1303   RBC 4.88 05/01/2019 1303   HGB 14.2 05/01/2019 1303   HGB 13.4 03/04/2018 1009   HCT 44.3 05/01/2019 1303   HCT 41.2 03/04/2018 1009   PLT 183 05/01/2019 1303   PLT 233 03/04/2018 1009   MCV 90.8 05/01/2019 1303   MCV 91 03/04/2018 1009   MCH 29.1 05/01/2019 1303   MCHC 32.1 05/01/2019 1303   RDW 11.9 05/01/2019 1303   RDW 12.0 (L) 03/04/2018 1009   LYMPHSABS 1.4 03/04/2018 1009   MONOABS 0.4 09/03/2008 2035   EOSABS 0.0 03/04/2018 1009   BASOSABS 0.0 03/04/2018 1009    No results found for: POCLITH, LITHIUM   No results found for: PHENYTOIN, PHENOBARB, VALPROATE, CBMZ   .res Assessment: Plan:   Plan:  PDMP reviewed  1. Lamictal 100mg  to 150mg  daily 2. Topamax 100mg  at hs  Continue therapy with Bambi Cottle.  RTC 4 weeks  Patient advised to contact office with any questions, adverse effects, or acute worsening in signs and symptoms.  Counseled patient regarding potential benefits, risks, and side effects of Lamictal to include potential risk of Stevens-Johnson syndrome. Advised patient to stop taking Lamictal and contact office immediately if rash develops and to seek urgent medical attention if rash is severe  and/or spreading quickly.  Diagnoses and all orders for this visit:  Bipolar I disorder ([place])  Major depressive disorder, recurrent episode, moderate (HCC)  Insomnia, unspecified type  Generalized anxiety disorder     Please see After Visit Summary for patient specific instructions.  Future Appointments  Date Time Provider [place]  12/12/2019  3:00 PM Cottle, Bambi G, LCSW [facility] None  12/19/2019  2:00 PM [facility] None    No orders of the defined types were placed in this encounter.   -------------------------------

## 2019-11-08 ENCOUNTER — Ambulatory Visit: Payer: BLUE CROSS/BLUE SHIELD | Admitting: Psychology

## 2019-12-05 ENCOUNTER — Ambulatory Visit: Payer: BLUE CROSS/BLUE SHIELD | Admitting: Adult Health

## 2019-12-05 ENCOUNTER — Other Ambulatory Visit: Payer: Self-pay | Admitting: Adult Health

## 2019-12-05 DIAGNOSIS — F319 Bipolar disorder, unspecified: Secondary | ICD-10-CM

## 2019-12-12 ENCOUNTER — Ambulatory Visit (INDEPENDENT_AMBULATORY_CARE_PROVIDER_SITE_OTHER): Payer: BLUE CROSS/BLUE SHIELD | Admitting: Psychology

## 2019-12-12 DIAGNOSIS — F3132 Bipolar disorder, current episode depressed, moderate: Secondary | ICD-10-CM | POA: Diagnosis not present

## 2019-12-12 DIAGNOSIS — F4310 Post-traumatic stress disorder, unspecified: Secondary | ICD-10-CM

## 2019-12-13 ENCOUNTER — Encounter: Payer: Self-pay | Admitting: Adult Health

## 2019-12-13 ENCOUNTER — Ambulatory Visit (INDEPENDENT_AMBULATORY_CARE_PROVIDER_SITE_OTHER): Payer: BLUE CROSS/BLUE SHIELD | Admitting: Adult Health

## 2019-12-13 ENCOUNTER — Other Ambulatory Visit: Payer: Self-pay

## 2019-12-13 DIAGNOSIS — F319 Bipolar disorder, unspecified: Secondary | ICD-10-CM

## 2019-12-13 DIAGNOSIS — F411 Generalized anxiety disorder: Secondary | ICD-10-CM | POA: Diagnosis not present

## 2019-12-13 DIAGNOSIS — F331 Major depressive disorder, recurrent, moderate: Secondary | ICD-10-CM

## 2019-12-13 DIAGNOSIS — G4700 Insomnia, unspecified: Secondary | ICD-10-CM | POA: Diagnosis not present

## 2019-12-13 MED ORDER — LAMOTRIGINE 200 MG PO TABS: 200.0000 mg | ORAL_TABLET | Freq: Every day | ORAL | 1 refills | Status: DC

## 2019-12-13 MED ORDER — TOPIRAMATE 100 MG PO TABS: ORAL_TABLET | ORAL | 1 refills | Status: DC

## 2019-12-13 NOTE — Progress Notes (Signed)
Emily Hernandez [id number] 07-10-1996 23 y.o.  Subjective:   Patient ID:  Emily Hernandez is a 24 y.o. (DOB 07-13-1996) female.  Chief Complaint: No chief complaint on file.   HPI Emily Hernandez presents to the office today for follow-up of MDD, GAD, insomnia, and Bipolar disorder.   Describes mood today as "improving". Pleasant. Flat. Mood symptoms - reports depression, anxiety, and irritability. Stating "I've been able to tell a difference with the increase in Lamictal". Some mood instability - "ups and downs, but better". Work remains stressful. Almost finished with school. Will take boards upcoming and will be able get a different job - "thinking about options". Improved interest and motivation. Taking medications as prescribed.  Energy levels "up and down". Active, has a regular exercise routine - 2 to 3 times a week. Works full-time. Full time student at [employer]. Enjoys some usual interests and activities. Single. Dating. Lives with sister. Mother in [place].  Appetite adequate. Weight loss - "a few pounds".  Sleeps better some nights than others. Averages 6 to 8 hours. Reports decreased nightmares. Focus and concentration difficulties. Completing tasks. Managing aspects of household. Works full time at [employer] 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 Denies SI or HI. Denies AH or VH.  Previous medication trials: Latuda, Abilify, Lamictal, Topamax, Vraylar   Review of Systems:  Review of Systems  Musculoskeletal: Negative for gait problem.  Neurological: Negative for tremors.  Psychiatric/Behavioral:       Please refer to HPI    Medications: I have reviewed the patient's current medications.  Current Outpatient Medications  Medication Sig Dispense Refill  . gentamicin cream (GARAMYCIN) 0.1 % Apply 1 application topically 2 (two) times daily. 15 g 1  . lamoTRIgine (LAMICTAL) 200 MG tablet Take 1 tablet (200 mg total) by mouth at bedtime. 90 tablet 1  .  medroxyPROGESTERone Acetate 150 MG/ML SUSY Inject 1 mL (150 mg total) into the muscle once for 1 dose. 1 mL 3  . terbinafine (LAMISIL) 250 MG tablet Take 1 tablet (250 mg total) by mouth daily. 30 tablet 2  . topiramate (TOPAMAX) 100 MG tablet Take one tablet daily. 90 tablet 1  . valACYclovir (VALTREX) 1000 MG tablet Take 2 tabs BID x 1 day prn sx 30 tablet 1   No current facility-administered medications for this visit.    Medication Side Effects: None  Allergies:  Allergies  Allergen Reactions  . Penicillins     Swelling and Hives    Past Medical History:  Diagnosis Date  . Cold sore   . Idiopathic intracranial hypertension   . Vaccine for human papilloma virus (HPV) types 6, 11, 16, and 18 administered     Family History  Problem Relation Age of Onset  . GI Bleed Maternal Grandmother   . Heart attack Maternal Grandmother   . Healthy Brother   . Breast cancer Neg Hx   . Ovarian cancer Neg Hx   . Colon cancer Neg Hx   . Diabetes Neg Hx     Social History   Socioeconomic History  . Marital status: Single    Spouse name: Not on file  . Number of children: 0  . Years of education: Not on file  . Highest education level: Not on file  Occupational History  . Occupation: server  Tobacco Use  . Smoking status: Current Every Day Smoker    Packs/day: 0.25    Years: 2.00    Pack years: 0.50    Types: Cigarettes  Last attempt to quit: 02/21/2018    Years since quitting: 1.8  . Smokeless tobacco: Current User    Last attempt to quit: 08/17/2012  Substance and Sexual Activity  . Alcohol use: Yes  . Drug use: No  . Sexual activity: Yes    Birth control/protection: Injection  Other Topics Concern  . 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   Social Determinants of Health   Financial Resource Strain:   . Difficulty of Paying Living  Expenses:   Food Insecurity:   . Worried About Charity fundraiser in the Last Year:   . Arboriculturist in the Last Year:   Transportation Needs:   . Film/video editor (Medical):   Marland Kitchen Lack of Transportation (Non-Medical):   Physical Activity:   . Days of Exercise per Week:   . Minutes of Exercise per Session:   Stress:   . Feeling of Stress :   Social Connections:   . Frequency of Communication with Friends and Family:   . Frequency of Social Gatherings with Friends and Family:   . Attends Religious Services:   . Active Member of Clubs or Organizations:   . Attends Archivist Meetings:   Marland Kitchen Marital Status:   Intimate Partner Violence:   . Fear of Current or Ex-Partner:   . Emotionally Abused:   Marland Kitchen Physically Abused:   . Sexually Abused:     Past Medical History, Surgical history, Social history, and Family history were reviewed and updated as appropriate.   Please see review of systems for further details on the patient's review from today.   Objective:   Physical Exam:  There were no vitals taken for this visit.  Physical Exam Constitutional:      General: She is not in acute distress. Musculoskeletal:        General: No deformity.  Neurological:     Mental Status: She is alert and oriented to person, place, and time.     Coordination: Coordination normal.  Psychiatric:        Attention and Perception: Attention and perception normal. She does not perceive auditory or visual hallucinations.        Mood and Affect: Mood normal. Mood is not anxious or depressed. Affect is not labile, blunt, angry or inappropriate.        Speech: Speech normal.        Behavior: Behavior normal.        Thought Content: Thought content normal. Thought content is not paranoid or delusional. Thought content does not include homicidal or suicidal ideation. Thought content does not include homicidal or suicidal plan.        Cognition and Memory: Cognition and memory normal.         Judgment: Judgment normal.     Comments: Insight intact     Lab Review:     Component Value Date/Time   NA 138 05/01/2019 1303   K 4.0 05/01/2019 1303   CL 106 05/01/2019 1303   CO2 22 05/01/2019 1303   GLUCOSE 94 05/01/2019 1303   BUN 13 05/01/2019 1303   CREATININE 0.85 05/01/2019 1303   CREATININE 0.75 01/30/2014 1309   CALCIUM 9.2 05/01/2019 1303   PROT 7.5 05/01/2019 1303   ALBUMIN 4.2 05/01/2019 1303   AST 14 (L) 05/01/2019 1303   ALT  15 05/01/2019 1303   ALKPHOS 43 05/01/2019 1303   BILITOT 0.5 05/01/2019 1303   GFRNONAA >60 05/01/2019 1303   GFRAA >60 05/01/2019 1303       Component Value Date/Time   WBC 6.3 05/01/2019 1303   RBC 4.88 05/01/2019 1303   HGB 14.2 05/01/2019 1303   HGB 13.4 03/04/2018 1009   HCT 44.3 05/01/2019 1303   HCT 41.2 03/04/2018 1009   PLT 183 05/01/2019 1303   PLT 233 03/04/2018 1009   MCV 90.8 05/01/2019 1303   MCV 91 03/04/2018 1009   MCH 29.1 05/01/2019 1303   MCHC 32.1 05/01/2019 1303   RDW 11.9 05/01/2019 1303   RDW 12.0 (L) 03/04/2018 1009   LYMPHSABS 1.4 03/04/2018 1009   MONOABS 0.4 09/03/2008 2035   EOSABS 0.0 03/04/2018 1009   BASOSABS 0.0 03/04/2018 1009    No results found for: POCLITH, LITHIUM   No results found for: PHENYTOIN, PHENOBARB, VALPROATE, CBMZ   .res Assessment: Plan:    Plan:  PDMP reviewed  1. Lamictal 150mg  to 200mg  daily 2. Topamax 100mg  at hs  Consider adding Wellbutrin XL 150mg  daily for depression - denies seizure history.   Continue therapy with Bambi Cottle.  RTC 4 weeks  Patient advised to contact office with any questions, adverse effects, or acute worsening in signs and symptoms.  Counseled patient regarding potential benefits, risks, and side effects of Lamictal to include potential risk of Stevens-Johnson syndrome. Advised patient to stop taking Lamictal and contact office immediately if rash develops and to seek urgent medical attention if rash is severe and/or spreading  quickly.   Diagnoses and all orders for this visit:  Generalized anxiety disorder  Insomnia, unspecified type  Major depressive disorder, recurrent episode, moderate (HCC)  Bipolar I disorder (HCC) -     topiramate (TOPAMAX) 100 MG tablet; Take one tablet daily. -     lamoTRIgine (LAMICTAL) 200 MG tablet; Take 1 tablet (200 mg total) by mouth at bedtime.     Please see After Visit Summary for patient specific instructions.  Future Appointments  Date Time Provider [place]  12/19/2019  2:00 PM [facility] NURSE [facility] None  01/09/2020 10:40 AM Mayson Sterbenz, Berdie Ogren, NP [facility] None    No orders of the defined types were placed in this encounter.   -------------------------------

## 2019-12-19 ENCOUNTER — Ambulatory Visit: Payer: BLUE CROSS/BLUE SHIELD

## 2019-12-20 ENCOUNTER — Ambulatory Visit: Payer: BLUE CROSS/BLUE SHIELD

## 2019-12-27 ENCOUNTER — Telehealth: Payer: Self-pay | Admitting: Adult Health

## 2019-12-27 MED ORDER — BUPROPION HCL ER (XL) 150 MG PO TB24: ORAL_TABLET | ORAL | 2 refills | Status: DC

## 2019-12-27 NOTE — Telephone Encounter (Signed)
Pt stated per conversation with Rollene Fare she would like to start the Wellbutrin.

## 2019-12-27 NOTE — Telephone Encounter (Signed)
Script sent  

## 2020-01-02 ENCOUNTER — Other Ambulatory Visit: Payer: Self-pay

## 2020-01-02 ENCOUNTER — Ambulatory Visit: Payer: Self-pay

## 2020-01-02 DIAGNOSIS — Z3042 Encounter for surveillance of injectable contraceptive: Secondary | ICD-10-CM

## 2020-01-02 MED ORDER — MEDROXYPROGESTERONE ACETATE 150 MG/ML IM SUSP
150.0000 mg | Freq: Once | INTRAMUSCULAR | Status: AC
  Administered 2020-01-02: 150 mg via INTRAMUSCULAR

## 2020-01-02 NOTE — Progress Notes (Signed)
Patient presents today for Depo Provera injection outside of date range. Patient on menses that started today. Given IM Right Deltoid. Patient tolerated well.

## 2020-01-04 ENCOUNTER — Ambulatory Visit (LOCAL_COMMUNITY_HEALTH_CENTER): Payer: Self-pay

## 2020-01-04 ENCOUNTER — Other Ambulatory Visit: Payer: Self-pay

## 2020-01-04 DIAGNOSIS — Z23 Encounter for immunization: Secondary | ICD-10-CM

## 2020-01-04 NOTE — Progress Notes (Signed)
Pt states she has checked around with prior doctors and pediatricians and there is not a record of her having Hep A vaccine in past.

## 2020-01-09 ENCOUNTER — Ambulatory Visit: Payer: BLUE CROSS/BLUE SHIELD | Admitting: Psychology

## 2020-01-09 ENCOUNTER — Ambulatory Visit: Payer: BLUE CROSS/BLUE SHIELD | Admitting: Adult Health

## 2020-01-23 ENCOUNTER — Other Ambulatory Visit: Payer: Self-pay

## 2020-01-23 ENCOUNTER — Encounter: Payer: Self-pay | Admitting: Adult Health

## 2020-01-23 ENCOUNTER — Telehealth: Payer: Self-pay

## 2020-01-23 ENCOUNTER — Ambulatory Visit (INDEPENDENT_AMBULATORY_CARE_PROVIDER_SITE_OTHER): Payer: 59 | Admitting: Adult Health

## 2020-01-23 DIAGNOSIS — F319 Bipolar disorder, unspecified: Secondary | ICD-10-CM

## 2020-01-23 DIAGNOSIS — F411 Generalized anxiety disorder: Secondary | ICD-10-CM | POA: Diagnosis not present

## 2020-01-23 DIAGNOSIS — F331 Major depressive disorder, recurrent, moderate: Secondary | ICD-10-CM | POA: Diagnosis not present

## 2020-01-23 DIAGNOSIS — G4700 Insomnia, unspecified: Secondary | ICD-10-CM

## 2020-01-23 MED ORDER — BUPROPION HCL ER (XL) 300 MG PO TB24: ORAL_TABLET | ORAL | 1 refills | Status: DC

## 2020-01-23 NOTE — Telephone Encounter (Signed)
Pt called triage reporting that every since she got her last depo which was a week late. She had a regular period then it went away then  has had light bleeding ever since, not enough to wear a tampon, she has started new med's Lamictal, Topamax and Wellbutrin , Wellbutrin side affects could change her menstrual , She's been on depo for ever and not had periods, Could this be from getting the depo late or from her new med's. Please advise

## 2020-01-23 NOTE — Telephone Encounter (Signed)
Pt aware.

## 2020-01-23 NOTE — Telephone Encounter (Signed)
Could be either. BTB with depo is normal. Should resolve on its own. F/u prn.

## 2020-01-23 NOTE — Progress Notes (Signed)
[name] [id number] 06-Mar-1996 23 y.o.  Subjective:   Patient ID:  Emily Hernandez is a 24 y.o. (DOB 09-21-1995) female.  Chief Complaint: No chief complaint on file.   HPI Emily Hernandez presents to the office today for follow-up of MDD, GAD, insomnia, and Bipolar disorder.   Describes mood today as "ok". Pleasant. Flat. Mood symptoms - reports depression, anxiety, and irritability. Stating "I'm not feeling a lot different from last visit". Feels like the Wellbutrin has been helpful. Recently quit job at [employer]. Not making enough money to get by. 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 Having some "up and down days". Improved interest and motivation. Taking medications as prescribed.  Energy levels "not good". Active, does not  regular exercise routine. Works full-time 40 to 60 hours a week.  Enjoys some usual interests and activities. Single. Dating. Sister lives with her. Mother in [place].  Appetite adequate. Weight stable. Sleeps better some nights than others. Averages 5 to 8 hours - depends on work hours.  Focus and concentration "ok I guess". Completing tasks. Managing aspects of household. Works full time Engineering geologist.   Denies SI or HI. Denies AH or VH.  Previous medication trials: Latuda, Abilify, Lamictal, Topamax, Vraylar   Review of Systems:  Review of Systems  Musculoskeletal: Negative for gait problem.  Neurological: Negative for tremors.  Psychiatric/Behavioral:       Please refer to HPI    Medications: I have reviewed the patient's current medications.  Current Outpatient Medications  Medication Sig Dispense Refill  . buPROPion (WELLBUTRIN XL) 150 MG 24 hr tablet Take one tablet every morning. 30 tablet 2  . gentamicin cream (GARAMYCIN) 0.1 % Apply 1 application topically 2 (two) times daily. 15 g 1  . lamoTRIgine (LAMICTAL) 200 MG tablet Take 1 tablet (200 mg total) by mouth at bedtime. 90  tablet 1  . medroxyPROGESTERone Acetate 150 MG/ML SUSY Inject 1 mL (150 mg total) into the muscle once for 1 dose. 1 mL 3  . terbinafine (LAMISIL) 250 MG tablet Take 1 tablet (250 mg total) by mouth daily. 30 tablet 2  . topiramate (TOPAMAX) 100 MG tablet Take one tablet daily. 90 tablet 1  . valACYclovir (VALTREX) 1000 MG tablet Take 2 tabs BID x 1 day prn sx 30 tablet 1   No current facility-administered medications for this visit.    Medication Side Effects: None  Allergies:  Allergies  Allergen Reactions  . Penicillins     Swelling and Hives    Past Medical History:  Diagnosis Date  . Cold sore   . Idiopathic intracranial hypertension   . Vaccine for human papilloma virus (HPV) types 6, 11, 16, and 18 administered     Family History  Problem Relation Age of Onset  . GI Bleed Maternal Grandmother   . Heart attack Maternal Grandmother   . Healthy Brother   . Breast cancer Neg Hx   . Ovarian cancer Neg Hx   . Colon cancer Neg Hx   . Diabetes Neg Hx     Social History   Socioeconomic History  . Marital status: Single    Spouse name: Not on file  . Number of children: 0  . Years of education: Not on file  . Highest education level: Not on file  Occupational History  . Occupation: server  Tobacco Use  . Smoking status: Current Every Day Smoker    Packs/day: 0.25  Years: 2.00    Pack years: 0.50    Types: Cigarettes    Last attempt to quit: 02/21/2018    Years since quitting: 1.9  . Smokeless tobacco: Current User    Last attempt to quit: 08/17/2012  Substance and Sexual Activity  . Alcohol use: Yes  . Drug use: No  . Sexual activity: Yes    Birth control/protection: Injection  Other Topics Concern  . 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   Social Determinants of Health   Financial Resource Strain:   . Difficulty of Paying  Living Expenses:   Food Insecurity:   . Worried About Charity fundraiser in the Last Year:   . Arboriculturist in the Last Year:   Transportation Needs:   . Film/video editor (Medical):   Marland Kitchen Lack of Transportation (Non-Medical):   Physical Activity:   . Days of Exercise per Week:   . Minutes of Exercise per Session:   Stress:   . Feeling of Stress :   Social Connections:   . Frequency of Communication with Friends and Family:   . Frequency of Social Gatherings with Friends and Family:   . Attends Religious Services:   . Active Member of Clubs or Organizations:   . Attends Archivist Meetings:   Marland Kitchen Marital Status:   Intimate Partner Violence:   . Fear of Current or Ex-Partner:   . Emotionally Abused:   Marland Kitchen Physically Abused:   . Sexually Abused:     Past Medical History, Surgical history, Social history, and Family history were reviewed and updated as appropriate.   Please see review of systems for further details on the patient's review from today.   Objective:   Physical Exam:  There were no vitals taken for this visit.  Physical Exam Constitutional:      General: She is not in acute distress. Musculoskeletal:        General: No deformity.  Neurological:     Mental Status: She is alert and oriented to person, place, and time.     Coordination: Coordination normal.  Psychiatric:        Attention and Perception: Attention and perception normal. She does not perceive auditory or visual hallucinations.        Mood and Affect: Mood is anxious and depressed. Affect is not labile, blunt, angry or inappropriate.        Speech: Speech normal.        Behavior: Behavior normal.        Thought Content: Thought content normal. Thought content is not paranoid or delusional. Thought content does not include homicidal or suicidal ideation. Thought content does not include homicidal or suicidal plan.        Cognition and Memory: Cognition and memory normal.         Judgment: Judgment normal.     Comments: Insight intact     Lab Review:     Component Value Date/Time   NA 138 05/01/2019 1303   K 4.0 05/01/2019 1303   CL 106 05/01/2019 1303   CO2 22 05/01/2019 1303   GLUCOSE 94 05/01/2019 1303   BUN 13 05/01/2019 1303   CREATININE 0.85 05/01/2019 1303   CREATININE 0.75 01/30/2014 1309   CALCIUM 9.2 05/01/2019 1303   PROT 7.5 05/01/2019 1303   ALBUMIN  4.2 05/01/2019 1303   AST 14 (L) 05/01/2019 1303   ALT 15 05/01/2019 1303   ALKPHOS 43 05/01/2019 1303   BILITOT 0.5 05/01/2019 1303   GFRNONAA >60 05/01/2019 1303   GFRAA >60 05/01/2019 1303       Component Value Date/Time   WBC 6.3 05/01/2019 1303   RBC 4.88 05/01/2019 1303   HGB 14.2 05/01/2019 1303   HGB 13.4 03/04/2018 1009   HCT 44.3 05/01/2019 1303   HCT 41.2 03/04/2018 1009   PLT 183 05/01/2019 1303   PLT 233 03/04/2018 1009   MCV 90.8 05/01/2019 1303   MCV 91 03/04/2018 1009   MCH 29.1 05/01/2019 1303   MCHC 32.1 05/01/2019 1303   RDW 11.9 05/01/2019 1303   RDW 12.0 (L) 03/04/2018 1009   LYMPHSABS 1.4 03/04/2018 1009   MONOABS 0.4 09/03/2008 2035   EOSABS 0.0 03/04/2018 1009   BASOSABS 0.0 03/04/2018 1009    No results found for: POCLITH, LITHIUM   No results found for: PHENYTOIN, PHENOBARB, VALPROATE, CBMZ   .res Assessment: Plan:    Plan:  PDMP reviewed  1. Lamictal 200mg  daily 2. Topamax 100mg  at hs 3. Increase Wellbutrin XL 150mg  daily to 300mg  daily for depression   Continue therapy with Bambi Cottle.  RTC 4 weeks  Patient advised to contact office with any questions, adverse effects, or acute worsening in signs and symptoms.  Counseled patient regarding potential benefits, risks, and side effects of Lamictal to include potential risk of Stevens-Johnson syndrome. Advised patient to stop taking Lamictal and contact office immediately if rash develops and to seek urgent medical attention if rash is severe and/or spreading quickly.   There are no  diagnoses linked to this encounter.   Please see After Visit Summary for patient specific instructions.  Future Appointments  Date Time Provider [place]  02/06/2020 11:00 AM Cottle, Bambi G, LCSW [id number] None  03/26/2020 10:00 AM [facility] NUR[facility] None    No orders of the defined types were placed in this encounter.   -------------------------------

## 2020-01-31 ENCOUNTER — Telehealth: Payer: Self-pay | Admitting: Adult Health

## 2020-02-02 NOTE — Telephone Encounter (Signed)
Error

## 2020-02-06 ENCOUNTER — Ambulatory Visit: Payer: Self-pay | Admitting: Psychology

## 2020-02-20 ENCOUNTER — Other Ambulatory Visit: Payer: Self-pay

## 2020-02-20 ENCOUNTER — Encounter: Payer: Self-pay | Admitting: Adult Health

## 2020-02-20 ENCOUNTER — Ambulatory Visit (INDEPENDENT_AMBULATORY_CARE_PROVIDER_SITE_OTHER): Payer: 59 | Admitting: Adult Health

## 2020-02-20 DIAGNOSIS — F411 Generalized anxiety disorder: Secondary | ICD-10-CM | POA: Diagnosis not present

## 2020-02-20 DIAGNOSIS — F331 Major depressive disorder, recurrent, moderate: Secondary | ICD-10-CM | POA: Diagnosis not present

## 2020-02-20 DIAGNOSIS — F319 Bipolar disorder, unspecified: Secondary | ICD-10-CM | POA: Diagnosis not present

## 2020-02-20 MED ORDER — TOPIRAMATE 100 MG PO TABS: ORAL_TABLET | ORAL | 1 refills | Status: DC

## 2020-02-20 MED ORDER — LAMOTRIGINE 200 MG PO TABS: 200.0000 mg | ORAL_TABLET | Freq: Every day | ORAL | 1 refills | Status: DC

## 2020-02-20 NOTE — Progress Notes (Signed)
DELAYNIE Hernandez [id number] 25-Apr-1996 24 y.o.  Subjective:   Patient ID:  Emily Hernandez is a 24 y.o. (DOB 1996-02-16) female.  Chief Complaint: No chief complaint on file.   HPI ELVERDA WENDEL presents to the office today for follow-up of MDD, GAD, insomnia, and Bipolar disorder.   Describes mood today as "ok". Pleasant. Mood symptoms - reports depression, anxiety, and irritability. Stating "I'm doing better". Feels like the increase in Wellbutrin has been helpful. Has taken the written part of boards and passed. Waiting for the interview portion. Hopes to be able to start a new job soon. Currently working as a Educational psychologist. Stable interest and motivation. Taking medications as prescribed.  Energy levels "fine". Active, does not have a regular exercise routine.  Enjoys some usual interests and activities. Single. Dating. Sister lives with her. Mother in [place].  Appetite adequate. Weight stable. Sleep varies with work schedule. Averages 4 to 6 hours.  Focus and concentration stable. Completing tasks. Managing aspects of household. Works full-time 40 to 60 hours a week.  Denies SI or HI. Denies AH or VH.  Previous medication trials: Latuda, Abilify, Lamictal, Topamax, Vraylar  Review of Systems:  Review of Systems  Musculoskeletal: Negative for gait problem.  Neurological: Negative for tremors.  Psychiatric/Behavioral:       Please refer to HPI    Medications: I have reviewed the patient's current medications.  Current Outpatient Medications  Medication Sig Dispense Refill  . buPROPion (WELLBUTRIN XL) 300 MG 24 hr tablet Take one tablet every morning. 90 tablet 1  . gentamicin cream (GARAMYCIN) 0.1 % Apply 1 application topically 2 (two) times daily. 15 g 1  . lamoTRIgine (LAMICTAL) 200 MG tablet Take 1 tablet (200 mg total) by mouth at bedtime. 90 tablet 1  . medroxyPROGESTERone Acetate 150 MG/ML SUSY Inject 1 mL (150 mg total) into the muscle once for 1 dose. 1 mL  3  . terbinafine (LAMISIL) 250 MG tablet Take 1 tablet (250 mg total) by mouth daily. 30 tablet 2  . topiramate (TOPAMAX) 100 MG tablet Take one tablet daily. 90 tablet 1  . valACYclovir (VALTREX) 1000 MG tablet Take 2 tabs BID x 1 day prn sx 30 tablet 1   No current facility-administered medications for this visit.    Medication Side Effects: None  Allergies:  Allergies  Allergen Reactions  . Penicillins     Swelling and Hives    Past Medical History:  Diagnosis Date  . Cold sore   . Idiopathic intracranial hypertension   . Vaccine for human papilloma virus (HPV) types 6, 11, 16, and 18 administered     Family History  Problem Relation Age of Onset  . GI Bleed Maternal Grandmother   . Heart attack Maternal Grandmother   . Healthy Brother   . Breast cancer Neg Hx   . Ovarian cancer Neg Hx   . Colon cancer Neg Hx   . Diabetes Neg Hx     Social History   Socioeconomic History  . Marital status: Single    Spouse name: Not on file  . Number of children: 0  . Years of education: Not on file  . Highest education level: Not on file  Occupational History  . Occupation: server  Tobacco Use  . Smoking status: Current Every Day Smoker    Packs/day: 0.25    Years: 2.00    Pack years: 0.50    Types: Cigarettes    Last attempt to quit: 02/21/2018  Years since quitting: 1.9  . Smokeless tobacco: Current User    Last attempt to quit: 08/17/2012  Vaping Use  . Vaping Use: Every day  Substance and Sexual Activity  . Alcohol use: Yes  . Drug use: No  . Sexual activity: Yes    Birth control/protection: Injection  Other Topics Concern  . 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   Social Determinants of Health   Financial Resource Strain:   . Difficulty of Paying Living Expenses:   Food Insecurity:   . Worried About Charity fundraiser in the  Last Year:   . Arboriculturist in the Last Year:   Transportation Needs:   . Film/video editor (Medical):   Marland Kitchen Lack of Transportation (Non-Medical):   Physical Activity:   . Days of Exercise per Week:   . Minutes of Exercise per Session:   Stress:   . Feeling of Stress :   Social Connections:   . Frequency of Communication with Friends and Family:   . Frequency of Social Gatherings with Friends and Family:   . Attends Religious Services:   . Active Member of Clubs or Organizations:   . Attends Archivist Meetings:   Marland Kitchen Marital Status:   Intimate Partner Violence:   . Fear of Current or Ex-Partner:   . Emotionally Abused:   Marland Kitchen Physically Abused:   . Sexually Abused:     Past Medical History, Surgical history, Social history, and Family history were reviewed and updated as appropriate.   Please see review of systems for further details on the patient's review from today.   Objective:   Physical Exam:  There were no vitals taken for this visit.  Physical Exam Constitutional:      General: She is not in acute distress. Musculoskeletal:        General: No deformity.  Neurological:     Mental Status: She is alert and oriented to person, place, and time.     Coordination: Coordination normal.  Psychiatric:        Attention and Perception: Attention and perception normal. She does not perceive auditory or visual hallucinations.        Mood and Affect: Mood normal. Mood is not anxious or depressed. Affect is not labile, blunt, angry or inappropriate.        Speech: Speech normal.        Behavior: Behavior normal.        Thought Content: Thought content normal. Thought content is not paranoid or delusional. Thought content does not include homicidal or suicidal ideation. Thought content does not include homicidal or suicidal plan.        Cognition and Memory: Cognition and memory normal.        Judgment: Judgment normal.     Comments: Insight intact     Lab  Review:     Component Value Date/Time   NA 138 05/01/2019 1303   K 4.0 05/01/2019 1303   CL 106 05/01/2019 1303   CO2 22 05/01/2019 1303   GLUCOSE 94 05/01/2019 1303   BUN 13 05/01/2019 1303   CREATININE 0.85 05/01/2019 1303   CREATININE 0.75 01/30/2014 1309   CALCIUM 9.2 05/01/2019 1303   PROT 7.5 05/01/2019 1303   ALBUMIN 4.2 05/01/2019 1303   AST 14 (L) 05/01/2019 1303  ALT 15 05/01/2019 1303   ALKPHOS 43 05/01/2019 1303   BILITOT 0.5 05/01/2019 1303   GFRNONAA >60 05/01/2019 1303   GFRAA >60 05/01/2019 1303       Component Value Date/Time   WBC 6.3 05/01/2019 1303   RBC 4.88 05/01/2019 1303   HGB 14.2 05/01/2019 1303   HGB 13.4 03/04/2018 1009   HCT 44.3 05/01/2019 1303   HCT 41.2 03/04/2018 1009   PLT 183 05/01/2019 1303   PLT 233 03/04/2018 1009   MCV 90.8 05/01/2019 1303   MCV 91 03/04/2018 1009   MCH 29.1 05/01/2019 1303   MCHC 32.1 05/01/2019 1303   RDW 11.9 05/01/2019 1303   RDW 12.0 (L) 03/04/2018 1009   LYMPHSABS 1.4 03/04/2018 1009   MONOABS 0.4 09/03/2008 2035   EOSABS 0.0 03/04/2018 1009   BASOSABS 0.0 03/04/2018 1009    No results found for: POCLITH, LITHIUM   No results found for: PHENYTOIN, PHENOBARB, VALPROATE, CBMZ   .res Assessment: Plan:    Plan:  PDMP reviewed  1. Lamictal 200mg  daily 2. Topamax 100mg  at hs 3. Wellbutrin XL  300mg  daily for depression   Continue therapy with Bambi Cottle.  RTC 3 months  Patient advised to contact office with any questions, adverse effects, or acute worsening in signs and symptoms.  Counseled patient regarding potential benefits, risks, and side effects of Lamictal to include potential risk of Stevens-Johnson syndrome. Advised patient to stop taking Lamictal and contact office immediately if rash develops and to seek urgent medical attention if rash is severe and/or spreading quickly.  Diagnoses and all orders for this visit:  Insomnia, unspecified type  Bipolar I disorder (HCC) -      lamoTRIgine (LAMICTAL) 200 MG tablet; Take 1 tablet (200 mg total) by mouth at bedtime. -     topiramate (TOPAMAX) 100 MG tablet; Take one tablet daily.  Generalized anxiety disorder  Major depressive disorder, recurrent episode, moderate ([place])     Please see After Visit Summary for patient specific instructions.  Future Appointments  Date Time Provider [place]  03/26/2020 10:00 AM [facility] NURSE [facility] None  05/22/2020  9:40 AM Ruvim Risko, Berdie Ogren, NP [facility] None    No orders of the defined types were placed in this encounter.   -------------------------------

## 2020-03-26 ENCOUNTER — Ambulatory Visit (INDEPENDENT_AMBULATORY_CARE_PROVIDER_SITE_OTHER): Payer: 59

## 2020-03-26 ENCOUNTER — Other Ambulatory Visit: Payer: Self-pay

## 2020-03-26 DIAGNOSIS — Z3042 Encounter for surveillance of injectable contraceptive: Secondary | ICD-10-CM

## 2020-03-26 MED ORDER — MEDROXYPROGESTERONE ACETATE 150 MG/ML IM SUSP
150.0000 mg | Freq: Once | INTRAMUSCULAR | Status: AC
  Administered 2020-03-26: 150 mg via INTRAMUSCULAR

## 2020-03-26 NOTE — Progress Notes (Signed)
Pt here for depo which was given IM right deltoid.  NDC# [address]-2 

## 2020-05-22 ENCOUNTER — Other Ambulatory Visit: Payer: Self-pay

## 2020-05-22 ENCOUNTER — Encounter: Payer: Self-pay | Admitting: Adult Health

## 2020-05-22 ENCOUNTER — Ambulatory Visit (INDEPENDENT_AMBULATORY_CARE_PROVIDER_SITE_OTHER): Payer: 59 | Admitting: Adult Health

## 2020-05-22 DIAGNOSIS — G4700 Insomnia, unspecified: Secondary | ICD-10-CM

## 2020-05-22 DIAGNOSIS — F411 Generalized anxiety disorder: Secondary | ICD-10-CM | POA: Diagnosis not present

## 2020-05-22 DIAGNOSIS — F331 Major depressive disorder, recurrent, moderate: Secondary | ICD-10-CM | POA: Diagnosis not present

## 2020-05-22 DIAGNOSIS — F319 Bipolar disorder, unspecified: Secondary | ICD-10-CM | POA: Diagnosis not present

## 2020-05-22 MED ORDER — LAMOTRIGINE 200 MG PO TABS: 200.0000 mg | ORAL_TABLET | Freq: Every day | ORAL | 1 refills | Status: DC

## 2020-05-22 MED ORDER — TOPIRAMATE 100 MG PO TABS: ORAL_TABLET | ORAL | 1 refills | Status: DC

## 2020-05-22 MED ORDER — LAMOTRIGINE 100 MG PO TABS: ORAL_TABLET | ORAL | 1 refills | Status: DC

## 2020-05-22 MED ORDER — BUPROPION HCL ER (XL) 300 MG PO TB24: ORAL_TABLET | ORAL | 1 refills | Status: DC
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
## 2020-06-06 ENCOUNTER — Telehealth: Payer: Self-pay

## 2020-06-06 ENCOUNTER — Other Ambulatory Visit: Payer: Self-pay | Admitting: Obstetrics and Gynecology

## 2020-06-06 DIAGNOSIS — B001 Herpesviral vesicular dermatitis: Secondary | ICD-10-CM

## 2020-06-06 MED ORDER — VALACYCLOVIR HCL 1 G PO TABS: ORAL_TABLET | ORAL | 1 refills | Status: DC

## 2020-06-06 NOTE — Progress Notes (Signed)
Rx RF valtrex for cold sore

## 2020-06-06 NOTE — Telephone Encounter (Signed)
Pt calling for refill for cold sores.  [phone]

## 2020-06-06 NOTE — Telephone Encounter (Signed)
Rx RF eRxd. Annual due 12/21

## 2020-06-07 NOTE — Telephone Encounter (Signed)
Pt aware by detailed vm. 

## 2020-06-18 ENCOUNTER — Other Ambulatory Visit: Payer: Self-pay

## 2020-06-18 ENCOUNTER — Ambulatory Visit (INDEPENDENT_AMBULATORY_CARE_PROVIDER_SITE_OTHER): Payer: 59

## 2020-06-18 DIAGNOSIS — Z3042 Encounter for surveillance of injectable contraceptive: Secondary | ICD-10-CM | POA: Diagnosis not present

## 2020-06-18 MED ORDER — MEDROXYPROGESTERONE ACETATE 150 MG/ML IM SUSP
150.0000 mg | Freq: Once | INTRAMUSCULAR | Status: AC
  Administered 2020-06-18: 150 mg via INTRAMUSCULAR

## 2020-06-18 NOTE — Progress Notes (Signed)
Pt here for depo which was given IM right deltoid.  NDC# [id number]

## 2020-06-19 ENCOUNTER — Ambulatory Visit: Payer: 59 | Admitting: Adult Health

## 2020-06-27 IMAGING — CT CT ABD-PELV W/ CM
2 of 4 series · 16 of 46 positions shown, 18 images · IV contrast (APPLIED)
Comparison: None.

CLINICAL DATA: Abdominal pain

EXAM:
CT ABDOMEN AND PELVIS WITH CONTRAST
TECHNIQUE: Multidetector CT imaging of the abdomen and pelvis was performed
using the standard protocol following bolus administration of
intravenous contrast.
CONTRAST:  100mL OMNIPAQUE IOHEXOL 300 MG/ML  SOLN

[Series 2: axial st · axial · 0.82mm/px · z∈[-481,-31]mm · 13 of 100 slices shown, 15 images]
[im 5/100  soft-tissue]
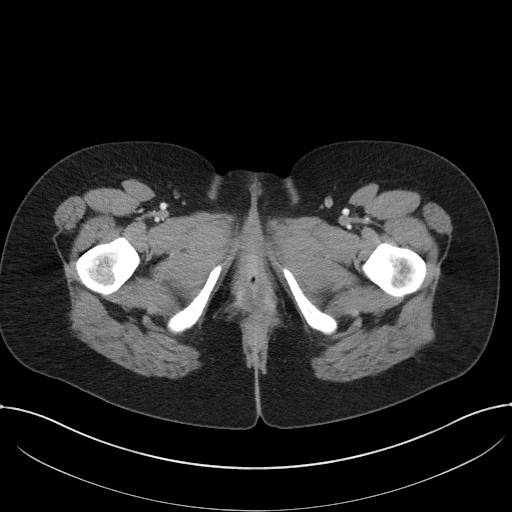
[im 5/100  bone]
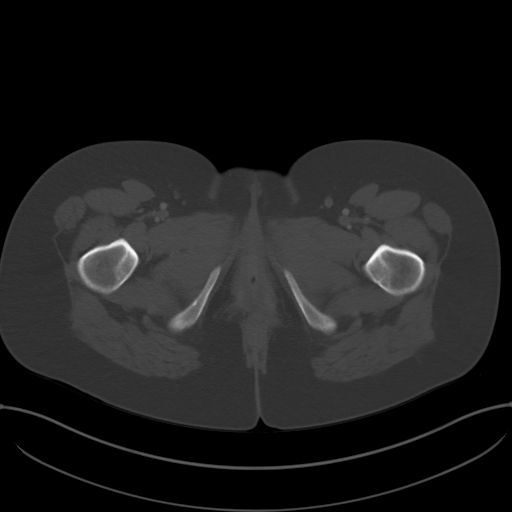
[im 13/100  soft-tissue]
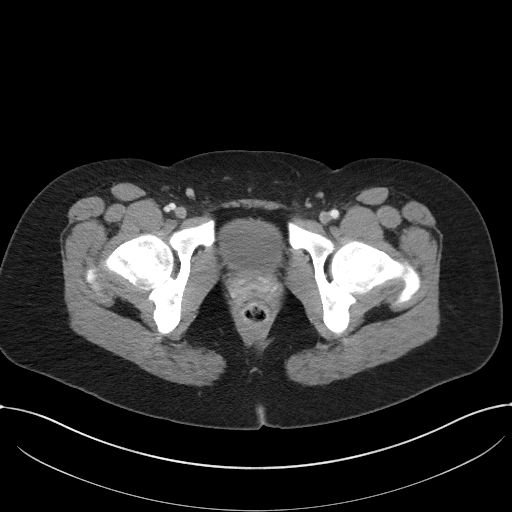
[im 21/100  soft-tissue]
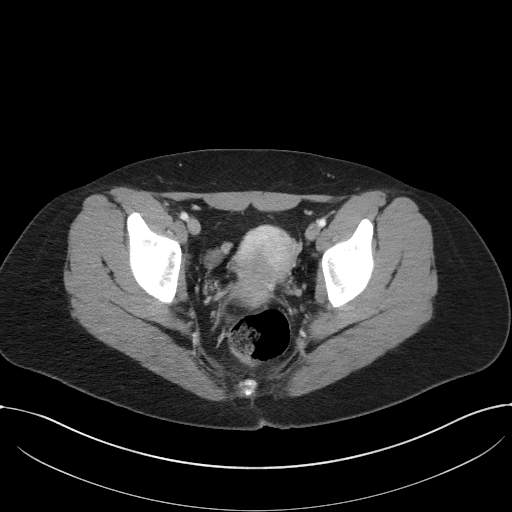
[im 29/100  soft-tissue]
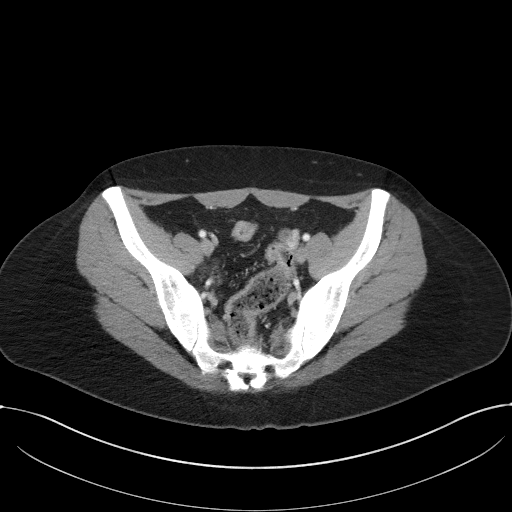
[im 34/100  soft-tissue]
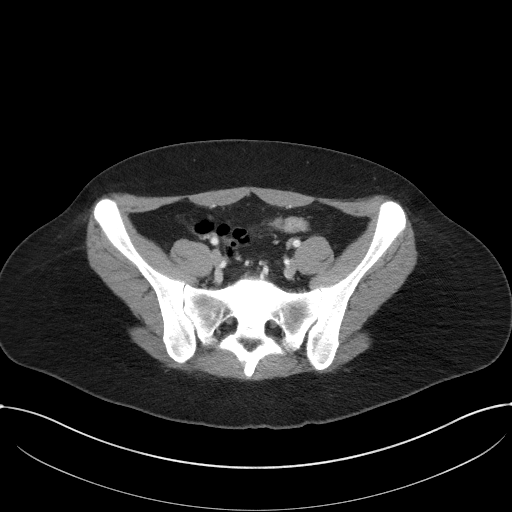
[im 42/100  soft-tissue]
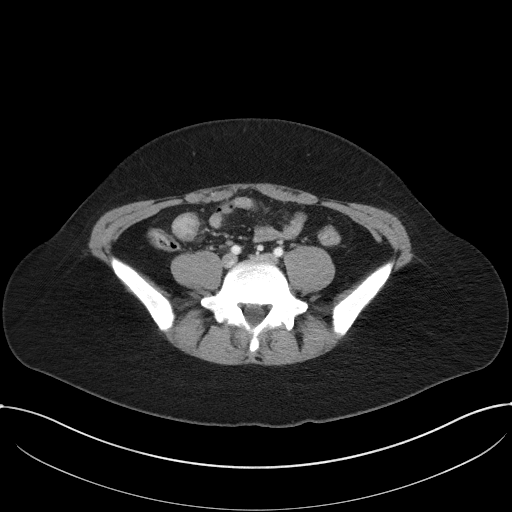
[im 50/100  soft-tissue]
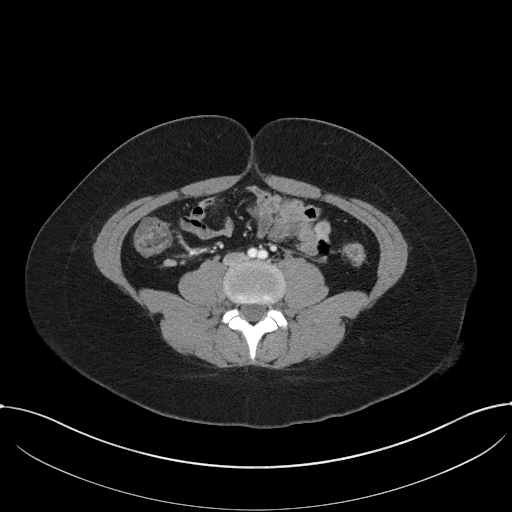
[im 58/100  soft-tissue]
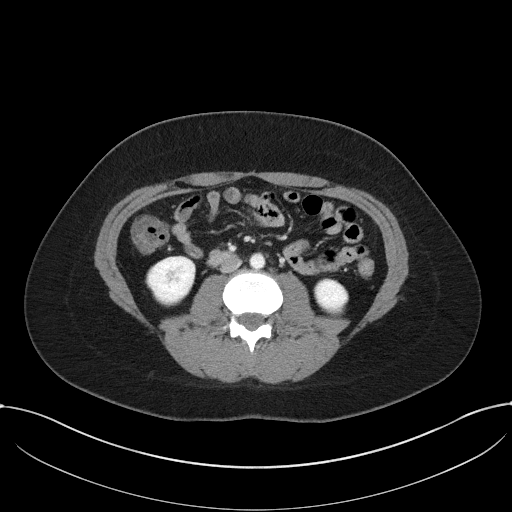
[im 67/100  soft-tissue]
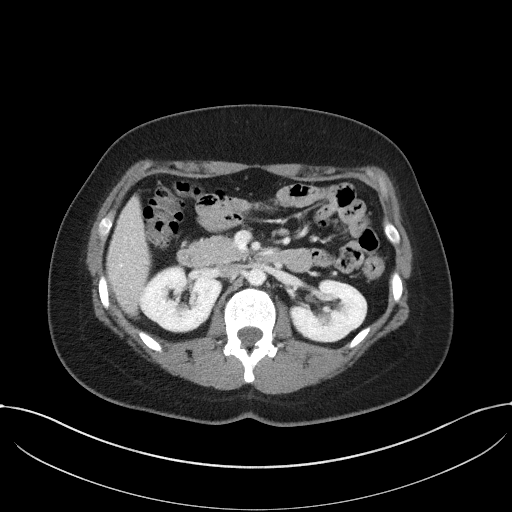
[im 67/100  bone]
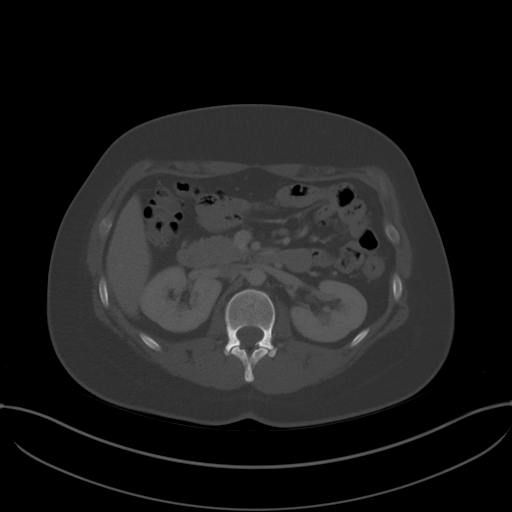
[im 71/100  soft-tissue]
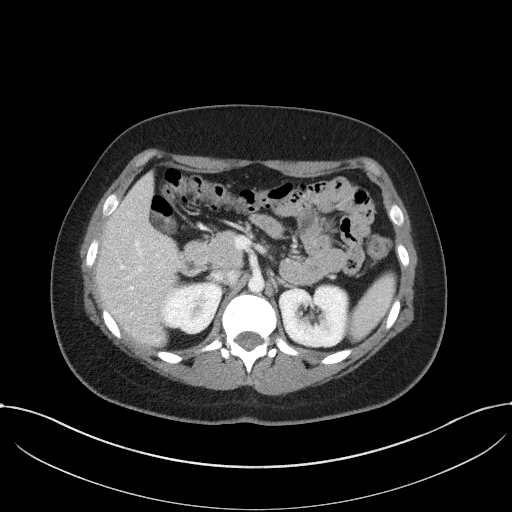
[im 79/100  soft-tissue]
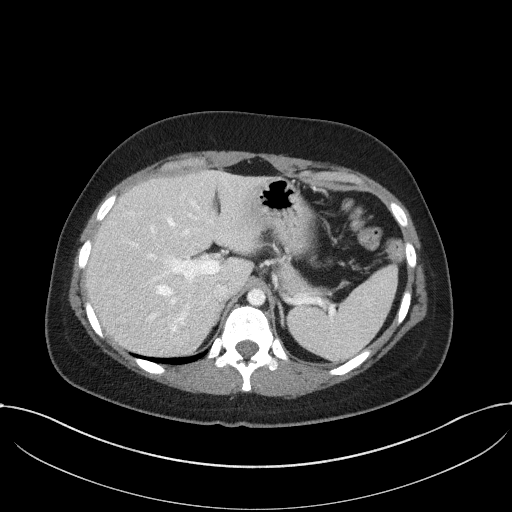
[im 87/100  soft-tissue]
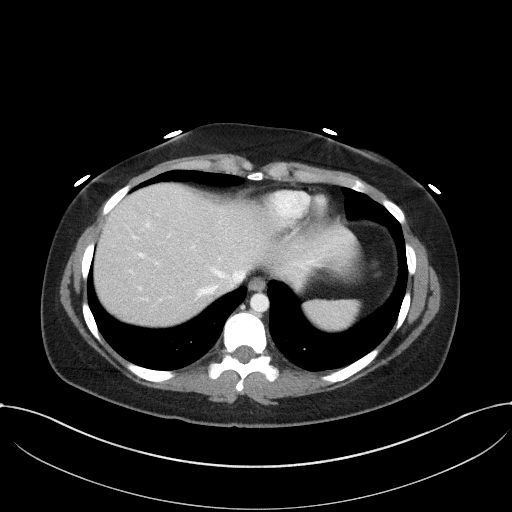
[im 95/100  soft-tissue]
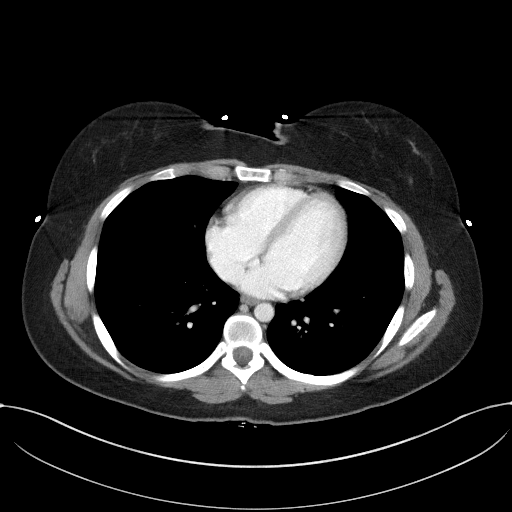

[Series 5: coronal st · coronal · 0.87mm/px · 3 of 84 slices shown]
[im 28/84  soft-tissue]
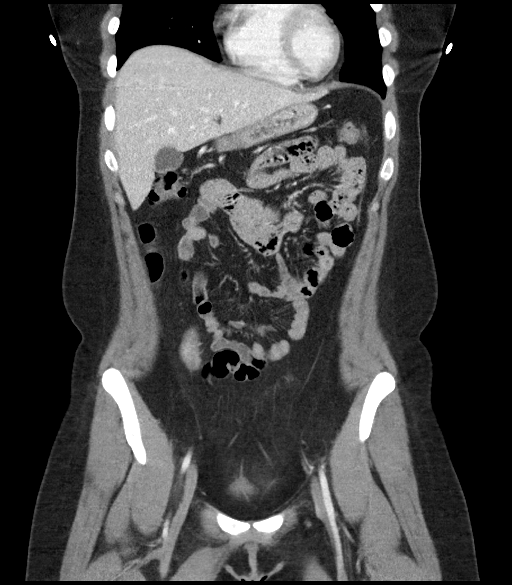
[im 37/84  soft-tissue]
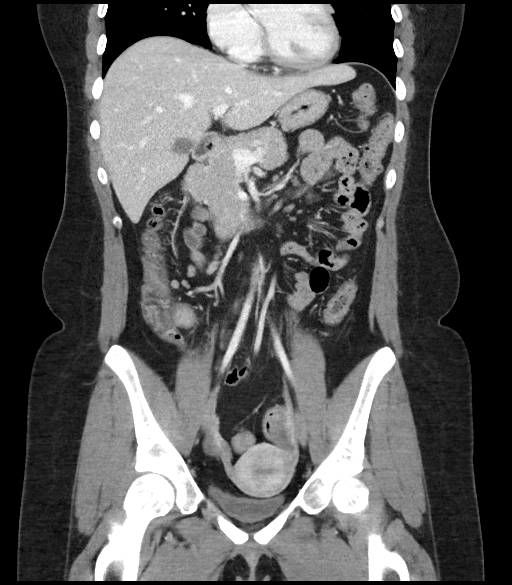
[im 47/84  soft-tissue]
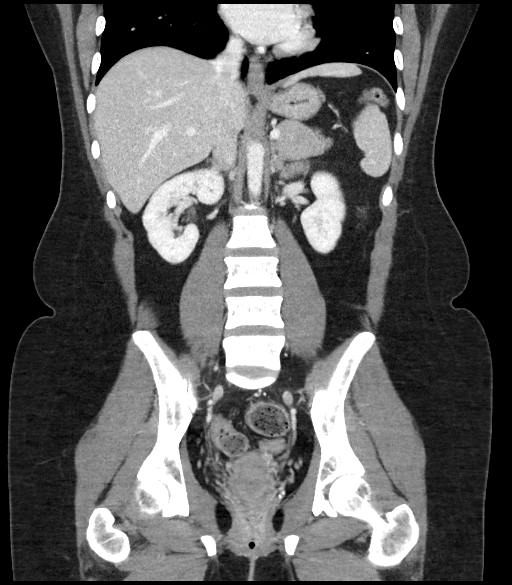

[16 of 46 positions shown; findings below may reference images not displayed]

FINDINGS: Lower chest: Lung bases are clear.

Hepatobiliary: No focal liver lesions are evident. Gallbladder wall
is not appreciably thickened. There is no biliary duct dilatation.

Pancreas: There is no pancreatic mass or inflammatory focus.

Spleen: No splenic lesions are evident.

Adrenals/Urinary Tract: Adrenals bilaterally appear unremarkable.
Kidneys bilaterally show no evident mass or hydronephrosis on either
side. There is no evident renal or ureteral calculus on either side.
Urinary bladder is midline with wall thickness within normal limits.

Stomach/Bowel: There is no appreciable bowel wall or mesenteric
thickening. There is no evident bowel obstruction. Terminal ileum
appears unremarkable. There is no evident free air or portal venous
air.

Vascular/Lymphatic: There is no abdominal aortic aneurysm. No
vascular lesions are evident. There is no adenopathy by size
criteria in the abdomen or pelvis. There are multiple right lower
quadrant subcentimeter mesenteric lymph nodes.

Reproductive: Uterus is anteverted.  No evident pelvic mass.

Other: Appendix appears unremarkable. No evident abscess or ascites
in the abdomen or pelvis. There is mild fat in the umbilicus.

Musculoskeletal: No blastic or lytic bone lesions are evident. No
intramuscular lesions are apparent.
IMPRESSION: 1. Appendix appears normal. No bowel wall thickening or bowel
obstruction evident. No abscess in the abdomen or pelvis.

2. There are multiple subcentimeter right lower quadrant mesenteric
lymph nodes. In the appropriate clinical setting, these lymph nodes
may be indicative of a degree of mesenteric adenitis. There is no
frank adenopathy by size criteria in the abdomen or pelvis.

3. No renal or ureteral calculi. No hydronephrosis. Urinary bladder
wall thickness is within normal limits.

## 2020-07-18 ENCOUNTER — Telehealth: Payer: Self-pay | Admitting: Adult Health

## 2020-07-18 ENCOUNTER — Encounter: Payer: Self-pay | Admitting: Adult Health

## 2020-07-18 ENCOUNTER — Ambulatory Visit (INDEPENDENT_AMBULATORY_CARE_PROVIDER_SITE_OTHER): Payer: 59 | Admitting: Adult Health

## 2020-07-18 ENCOUNTER — Other Ambulatory Visit: Payer: Self-pay

## 2020-07-18 DIAGNOSIS — F319 Bipolar disorder, unspecified: Secondary | ICD-10-CM | POA: Diagnosis not present

## 2020-07-18 DIAGNOSIS — F411 Generalized anxiety disorder: Secondary | ICD-10-CM

## 2020-07-18 DIAGNOSIS — F331 Major depressive disorder, recurrent, moderate: Secondary | ICD-10-CM | POA: Diagnosis not present

## 2020-07-18 DIAGNOSIS — G4700 Insomnia, unspecified: Secondary | ICD-10-CM | POA: Diagnosis not present

## 2020-07-18 MED ORDER — TOPIRAMATE 100 MG PO TABS: ORAL_TABLET | ORAL | 1 refills | Status: DC

## 2020-07-18 MED ORDER — LAMOTRIGINE 100 MG PO TABS: ORAL_TABLET | ORAL | 1 refills | Status: DC

## 2020-07-18 MED ORDER — BUPROPION HCL ER (XL) 150 MG PO TB24: ORAL_TABLET | ORAL | 1 refills | Status: DC

## 2020-07-18 MED ORDER — LAMOTRIGINE 200 MG PO TABS: 200.0000 mg | ORAL_TABLET | Freq: Every day | ORAL | 1 refills | Status: DC

## 2020-07-18 NOTE — Progress Notes (Signed)
Emily Hernandez [id number] 07-28-1996 24 y.o.  Subjective:   Patient ID:  Emily Hernandez is a 24 y.o. (DOB 09/23/1995) female.  Chief Complaint: No chief complaint on file.   HPI Emily Hernandez presents to the office today for follow-up of MDD, GAD, insomnia, and Bipolar disorder.   Describes mood today as "ok". Pleasant. Mood symptoms - reports depression, anxiety, and irritability. Feels like the increase in Wellbutrin has been helpful and would like to increase dose to 450mg  every morning. Mood has been "fairly stable". Stating "I'm doing better than I was". Has been able to get a car and feels "better". Has a car payment which is stressful. Had an interview for a job. Not feeling as overwhelmed. Has been able to pay off lawyer to help with the DUI.  Improved interest and motivation. Taking other medications as prescribed.  Energy levels lower. Active, does not have a regular exercise routine.  Enjoys some usual interests and activities. Single. Dating. Sister lives with her. Mother in [place].  Appetite adequate. Weight stable. Sleep varies with work schedule. Averages 3 to 4 hours.  Focus and concentration stable. Completing tasks. Managing aspects of household. 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  Denies SI or HI.  Denies AH or VH.  Previous medication trials: Latuda, Abilify, Lamictal, Topamax, Vraylar  Review of Systems:  Review of Systems  Musculoskeletal: Negative for gait problem.  Neurological: Negative for tremors.  Psychiatric/Behavioral:       Please refer to HPI    Medications: I have reviewed the patient's current medications.  Current Outpatient Medications  Medication Sig Dispense Refill  . buPROPion (WELLBUTRIN XL) 150 MG 24 hr tablet Take three tablets every morning. 270 tablet 1  . gentamicin cream (GARAMYCIN) 0.1 % Apply 1 application topically 2 (two) times daily. 15 g 1  . lamoTRIgine (LAMICTAL) 100 MG tablet Take one tablet daily.  90 tablet 1  . lamoTRIgine (LAMICTAL) 200 MG tablet Take 1 tablet (200 mg total) by mouth at bedtime. 90 tablet 1  . medroxyPROGESTERone Acetate 150 MG/ML SUSY Inject 1 mL (150 mg total) into the muscle once for 1 dose. 1 mL 3  . terbinafine (LAMISIL) 250 MG tablet Take 1 tablet (250 mg total) by mouth daily. 30 tablet 2  . topiramate (TOPAMAX) 100 MG tablet Take one tablet daily. 90 tablet 1  . valACYclovir (VALTREX) 1000 MG tablet Take 2 tabs BID x 1 day prn sx 30 tablet 1   No current facility-administered medications for this visit.    Medication Side Effects: None  Allergies:  Allergies  Allergen Reactions  . Penicillins     Swelling and Hives    Past Medical History:  Diagnosis Date  . Cold sore   . Idiopathic intracranial hypertension   . Vaccine for human papilloma virus (HPV) types 6, 11, 16, and 18 administered     Family History  Problem Relation Age of Onset  . GI Bleed Maternal Grandmother   . Heart attack Maternal Grandmother   . Healthy Brother   . Breast cancer Neg Hx   . Ovarian cancer Neg Hx   . Colon cancer Neg Hx   . Diabetes Neg Hx     Social History   Socioeconomic History  . Marital status: Single    Spouse name: Not on file  . Number of children: 0  . Years of education: Not on file  . Highest education level: Not on file  Occupational History  .  Occupation: server  Tobacco Use  . Smoking status: Current Every Day Smoker    Packs/day: 0.25    Years: 2.00    Pack years: 0.50    Types: Cigarettes    Last attempt to quit: 02/21/2018    Years since quitting: 2.4  . Smokeless tobacco: Current User    Last attempt to quit: 08/17/2012  Vaping Use  . Vaping Use: Every day  Substance and Sexual Activity  . Alcohol use: Yes  . Drug use: No  . Sexual activity: Yes    Birth control/protection: Injection  Other Topics Concern  . Not on file  Social History Narrative   She is currently a Educational psychologist at [employer] level of  education:  Some college   Lives with mother and stepfather.   Social Determinants of Health   Financial Resource Strain:   . Difficulty of Paying Living Expenses: Not on file  Food Insecurity:   . Worried About Charity fundraiser in the Last Year: Not on file  . Ran Out of Food in the Last Year: Not on file  Transportation Needs:   . Lack of Transportation (Medical): Not on file  . Lack of Transportation (Non-Medical): Not on file  Physical Activity:   . Days of Exercise per Week: Not on file  . Minutes of Exercise per Session: Not on file  Stress:   . Feeling of Stress : Not on file  Social Connections:   . Frequency of Communication with Friends and Family: Not on file  . Frequency of Social Gatherings with Friends and Family: Not on file  . Attends Religious Services: Not on file  . Active Member of Clubs or Organizations: Not on file  . Attends Archivist Meetings: Not on file  . Marital Status: Not on file  Intimate Partner Violence:   . Fear of Current or Ex-Partner: Not on file  . Emotionally Abused: Not on file  . Physically Abused: Not on file  . Sexually Abused: Not on file    Past Medical History, Surgical history, Social history, and Family history were reviewed and updated as appropriate.   Please see review of systems for further details on the patient's review from today.   Objective:   Physical Exam:  There were no vitals taken for this visit.  Physical Exam Constitutional:      General: She is not in acute distress. Musculoskeletal:        General: No deformity.  Neurological:     Mental Status: She is alert and oriented to person, place, and time.     Coordination: Coordination normal.  Psychiatric:        Attention and Perception: Attention and perception normal. She does not perceive auditory or visual hallucinations.        Mood and Affect: Mood normal. Mood is not anxious or depressed. Affect is not labile, blunt, angry or  inappropriate.        Speech: Speech normal.        Behavior: Behavior normal.        Thought Content: Thought content normal. Thought content is not paranoid or delusional. Thought content does not include homicidal or suicidal ideation. Thought content does not include homicidal or suicidal plan.        Cognition and Memory: Cognition and memory normal.        Judgment: Judgment normal.     Comments: Insight intact     Lab Review:  Component Value Date/Time   NA 138 05/01/2019 1303   K 4.0 05/01/2019 1303   CL 106 05/01/2019 1303   CO2 22 05/01/2019 1303   GLUCOSE 94 05/01/2019 1303   BUN 13 05/01/2019 1303   CREATININE 0.85 05/01/2019 1303   CREATININE 0.75 01/30/2014 1309   CALCIUM 9.2 05/01/2019 1303   PROT 7.5 05/01/2019 1303   ALBUMIN 4.2 05/01/2019 1303   AST 14 (L) 05/01/2019 1303   ALT 15 05/01/2019 1303   ALKPHOS 43 05/01/2019 1303   BILITOT 0.5 05/01/2019 1303   GFRNONAA >60 05/01/2019 1303   GFRAA >60 05/01/2019 1303       Component Value Date/Time   WBC 6.3 05/01/2019 1303   RBC 4.88 05/01/2019 1303   HGB 14.2 05/01/2019 1303   HGB 13.4 03/04/2018 1009   HCT 44.3 05/01/2019 1303   HCT 41.2 03/04/2018 1009   PLT 183 05/01/2019 1303   PLT 233 03/04/2018 1009   MCV 90.8 05/01/2019 1303   MCV 91 03/04/2018 1009   MCH 29.1 05/01/2019 1303   MCHC 32.1 05/01/2019 1303   RDW 11.9 05/01/2019 1303   RDW 12.0 (L) 03/04/2018 1009   LYMPHSABS 1.4 03/04/2018 1009   MONOABS 0.4 09/03/2008 2035   EOSABS 0.0 03/04/2018 1009   BASOSABS 0.0 03/04/2018 1009    No results found for: POCLITH, LITHIUM   No results found for: PHENYTOIN, PHENOBARB, VALPROATE, CBMZ   .res Assessment: Plan:    Plan:  PDMP reviewed  1. Lamictal 200mg  daily 2. Topamax 100mg  at hs 3. Wellbutrin XL 300mg  daily for depression  4. Lamictal 100mg  daily  Continue therapy with Bambi Cottle.   RTC 3 months  Patient advised to contact office with any questions, adverse effects, or  acute worsening in signs and symptoms.  Counseled patient regarding potential benefits, risks, and side effects of Lamictal to include potential risk of Stevens-Johnson syndrome. Advised patient to stop taking Lamictal and contact office immediately if rash develops and to seek urgent medical attention if rash is severe and/or spreading quickly.    Diagnoses and all orders for this visit:  Generalized anxiety disorder  Bipolar I disorder (HCC) -     lamoTRIgine (LAMICTAL) 200 MG tablet; Take 1 tablet (200 mg total) by mouth at bedtime. -     lamoTRIgine (LAMICTAL) 100 MG tablet; Take one tablet daily. -     topiramate (TOPAMAX) 100 MG tablet; Take one tablet daily.  Major depressive disorder, recurrent episode, moderate (HCC) -     buPROPion (WELLBUTRIN XL) 150 MG 24 hr tablet; Take three tablets every morning.  Insomnia, unspecified type     Please see After Visit Summary for patient specific instructions.  Future Appointments  Date Time Provider Joice  08/02/2020  8:30 AM Homero Fellers, MD [facility] None  09/10/2020 10:00 AM [facility] NURSE [facility] None  10/16/2020  9:00 AM Granvel Proudfoot, Berdie Ogren, NP [facility] None    No orders of the defined types were placed in this encounter.   -------------------------------

## 2020-07-18 NOTE — Telephone Encounter (Signed)
Was she increased to 450 mg? Note has 300 mg.

## 2020-07-18 NOTE — Telephone Encounter (Signed)
Emily Hernandez called because she just picked up her Wellbutrin and it says to take 3 150mg  tabs every morning.  She still has a whole bottle the 100mg  tabs too.  I told her that the instructions for 3 tabs qAM of the 150mg  was correct according to the notes. Take those as instructed but to not take the 100mg  as well.  She has been increased from 300mg  per day to 450mg  per day.  She asked if she could take 3 of the 100mg  and 1 150mg  to make 450mg  so she could use up the 100mg .  I told her that would be fine as long as stays at dose of 450mg .  I told her you would call her if I was wrong in my instruction because clearly I am not the prescriber.

## 2020-07-19 NOTE — Telephone Encounter (Signed)
Left patient message to call back to clarify what she should be taking.

## 2020-07-19 NOTE — Telephone Encounter (Signed)
She has been taking the Wellbutrin XL 300mg , but was increased to the XL 150mg  - 3 daily - 450mg .

## 2020-08-02 ENCOUNTER — Other Ambulatory Visit: Payer: Self-pay

## 2020-08-02 ENCOUNTER — Ambulatory Visit (INDEPENDENT_AMBULATORY_CARE_PROVIDER_SITE_OTHER): Payer: 59 | Admitting: Obstetrics and Gynecology

## 2020-08-02 ENCOUNTER — Encounter: Payer: Self-pay | Admitting: Obstetrics and Gynecology

## 2020-08-02 ENCOUNTER — Other Ambulatory Visit (HOSPITAL_COMMUNITY)
Admission: RE | Admit: 2020-08-02 | Discharge: 2020-08-02 | Disposition: A | Discharge status: Home / Self Care | Payer: 59 | Source: Ambulatory Visit | Attending: Obstetrics and Gynecology | Admitting: Obstetrics and Gynecology

## 2020-08-02 VITALS — BP 100/60 | Ht 63.0 in | Wt 159.0 lb

## 2020-08-02 DIAGNOSIS — Z01419 Encounter for gynecological examination (general) (routine) without abnormal findings: Secondary | ICD-10-CM

## 2020-08-02 DIAGNOSIS — Z124 Encounter for screening for malignant neoplasm of cervix: Secondary | ICD-10-CM | POA: Diagnosis not present

## 2020-08-02 DIAGNOSIS — Z3041 Encounter for surveillance of contraceptive pills: Secondary | ICD-10-CM

## 2020-08-02 DIAGNOSIS — Z113 Encounter for screening for infections with a predominantly sexual mode of transmission: Secondary | ICD-10-CM

## 2020-08-02 DIAGNOSIS — Z3042 Encounter for surveillance of injectable contraceptive: Secondary | ICD-10-CM

## 2020-08-02 DIAGNOSIS — Z0000 Encounter for general adult medical examination without abnormal findings: Secondary | ICD-10-CM

## 2020-08-02 DIAGNOSIS — R21 Rash and other nonspecific skin eruption: Secondary | ICD-10-CM

## 2020-08-02 DIAGNOSIS — L739 Follicular disorder, unspecified: Secondary | ICD-10-CM

## 2020-08-02 MED ORDER — CLINDAMYCIN PHOSPHATE 1 % EX GEL: CUTANEOUS | 0 refills | Status: DC

## 2020-08-02 MED ORDER — MEDROXYPROGESTERONE ACETATE 150 MG/ML IM SUSP: 150.0000 mg | INTRAMUSCULAR | 3 refills | Status: DC

## 2020-08-02 NOTE — Progress Notes (Signed)
Gynecology Annual Exam  PCP: Donald Prose, MD  Chief Complaint:  Chief Complaint  Patient presents with  . Gynecologic Exam    No concerns    History of Present Illness: Patient is a 24 y.o. G1P0010 presents for annual exam. The patient has no complaints today.   LMP: No LMP recorded. Patient has had an injection. Average Interval: amenorrhea with DEPO Heavy Menses: no Intermenstrual Bleeding: no Dysmenorrhea: no  The patient is sexually active. She denies dyspareunia.  Postcoital Bleeding: no She currently uses Depo-Provera injections for contraception.    The patient does perform self breast exams.  There is no notable family history of breast or ovarian cancer in her family.  The patient has regular exercise: active at work  The patient denies current symptoms of depression.   Review of Systems: Review of Systems  Constitutional: Negative for chills, fever, malaise/fatigue and weight loss.  HENT: Negative for congestion, hearing loss and sinus pain.   Eyes: Negative for blurred vision and double vision.  Respiratory: Negative for cough, sputum production, shortness of breath and wheezing.   Cardiovascular: Negative for chest pain, palpitations, orthopnea and leg swelling.  Gastrointestinal: Negative for abdominal pain, constipation, diarrhea, nausea and vomiting.  Genitourinary: Negative for dysuria, flank pain, frequency, hematuria and urgency.  Musculoskeletal: Negative for back pain, falls and joint pain.  Skin: Negative for itching and rash.  Neurological: Negative for dizziness and headaches.  Psychiatric/Behavioral: Negative for depression, substance abuse and suicidal ideas. The patient is not nervous/anxious.     Past Medical History:  Past Medical History:  Diagnosis Date  . Cold sore   . Idiopathic intracranial hypertension   . Vaccine for human papilloma virus (HPV) types 6, 11, 16, and 18 administered     Past Surgical History:  Past Surgical  History:  Procedure Laterality Date  . none      Gynecologic History:  No LMP recorded. Patient has had an injection. Contraception: Depo-Provera injections Last Pap: Results were: 2019 NIL   Obstetric History: G1P0010  Family History:  Family History  Problem Relation Age of Onset  . GI Bleed Maternal Grandmother   . Heart attack Maternal Grandmother   . Healthy Brother   . Breast cancer Neg Hx   . Ovarian cancer Neg Hx   . Colon cancer Neg Hx   . Diabetes Neg Hx     Social History:  Social History   Socioeconomic History  . Marital status: Single    Spouse name: Not on file  . Number of children: 0  . Years of education: Not on file  . Highest education level: Not on file  Occupational History  . Occupation: server  Tobacco Use  . Smoking status: Current Every Day Smoker    Packs/day: 0.25    Years: 2.00    Pack years: 0.50    Types: Cigarettes    Last attempt to quit: 02/21/2018    Years since quitting: 2.4  . Smokeless tobacco: Current User    Last attempt to quit: 08/17/2012  Vaping Use  . Vaping Use: Every day  Substance and Sexual Activity  . Alcohol use: Yes  . Drug use: No  . Sexual activity: Yes    Birth control/protection: Injection  Other Topics Concern  . 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   Social  Determinants of Health   Financial Resource Strain: Not on file  Food Insecurity: Not on file  Transportation Needs: Not on file  Physical Activity: Not on file  Stress: Not on file  Social Connections: Not on file  Intimate Partner Violence: Not on file    Allergies:  Allergies  Allergen Reactions  . Penicillins     Swelling and Hives    Medications: Prior to Admission medications   Medication Sig Start Date End Date Taking? Authorizing Provider  buPROPion (WELLBUTRIN XL) 150 MG 24 hr tablet Take three  tablets every morning. 07/18/20  Yes Mozingo, Berdie Ogren, NP  lamoTRIgine (LAMICTAL) 100 MG tablet Take one tablet daily. 07/18/20  Yes Mozingo, Berdie Ogren, NP  lamoTRIgine (LAMICTAL) 200 MG tablet Take 1 tablet (200 mg total) by mouth at bedtime. 07/18/20  Yes Mozingo, Berdie Ogren, NP  topiramate (TOPAMAX) 100 MG tablet Take one tablet daily. 07/18/20  Yes Mozingo, Berdie Ogren, NP  valACYclovir (VALTREX) 1000 MG tablet Take 2 tabs BID x 1 day prn sx 34/74/25  Yes Copland, Alicia B, PA-C  medroxyPROGESTERone Acetate 150 MG/ML SUSY Inject 1 mL (150 mg total) into the muscle once for 1 dose. 08/01/19 95/63/87  Copland, Deirdre Evener, PA-C    Physical Exam Vitals: Blood pressure 100/60, height 5\' 3"  (1.6 m), weight 159 lb (72.1 kg).  Physical Exam Constitutional:      Appearance: She is well-developed.  Genitourinary:     Vagina and uterus normal.     There is no lesion on the right labia.     There is no lesion on the left labia.    No lesions in the vagina.      Right Adnexa: no mass present.    Left Adnexa: no mass present.    No cervical motion tenderness.  Breasts:     Right: No inverted nipple, mass, nipple discharge or skin change.     Left: No inverted nipple, mass, nipple discharge or skin change.    HENT:     Head: Normocephalic and atraumatic.  Eyes:     Extraocular Movements: EOM normal.  Neck:     Thyroid: No thyromegaly.  Cardiovascular:     Rate and Rhythm: Normal rate and regular rhythm.     Heart sounds: Normal heart sounds.     Comments: Normal Breast Exam Pulmonary:     Effort: Pulmonary effort is normal.     Breath sounds: Normal breath sounds.  Abdominal:     General: Bowel sounds are normal. There is no distension.     Palpations: Abdomen is soft. There is no mass.  Musculoskeletal:     Cervical back: Neck supple.  Neurological:     Mental Status: She is alert and oriented to person, place, and time.  Skin:    General: Skin is warm and dry.      Findings: Rash present.  Psychiatric:        Mood and Affect: Mood and affect normal.        Behavior: Behavior normal.        Thought Content: Thought content normal.        Judgment: Judgment normal.  Vitals reviewed. Exam conducted with a chaperone present.      Female chaperone present for pelvic and breast  portions of the physical exam  Assessment: 23 y.o. G1P0010 routine annual exam  Plan: Problem List Items Addressed This Visit   None   Visit Diagnoses    Encounter for annual  routine gynecological examination    -  Primary   Health maintenance examination       Relevant Orders   Cytology - PAP   Cervical cancer screening       Screen for STD (sexually transmitted disease)       Relevant Orders   HIV antibody (with reflex)   RPR   Hepatitis panel, acute   Cytology - PAP   Encounter for gynecological examination without abnormal finding       Encounter for birth control pills maintenance       Encounter for surveillance of injectable contraceptive       Relevant Medications   medroxyPROGESTERone (DEPO-PROVERA) 150 MG/ML injection   Skin rash       Relevant Orders   Ambulatory referral to Dermatology   Folliculitis       Relevant Medications   clindamycin (CLINDAGEL) 1 % gel      1) STI screening was offered and accepted  2) ASCCP guidelines and rational discussed.  Patient opts for yearly screening interval  3) Contraception -patient desires to continue Depo Provera  4) Rash- several circular, erythematous lesions in groin, left shoulder and abdomen. Fungal rash vs eczema, will refer to dermatology   5) Folliculitis in grown- given rx for topical clindamycin, encouraged not to shave in groin.   6) Routine healthcare maintenance including cholesterol, diabetes screening discussed managed by PCP   Adrian Prows MD, [place], [facility] 08/02/2020 10:46 AM

## 2020-08-02 NOTE — Patient Instructions (Addendum)
Institute of Medicine Recommended Dietary Allowances for Calcium and Vitamin D  Age (yr) Calcium Recommended Dietary Allowance (mg/day) Vitamin D Recommended Dietary Allowance (international units/day)  9-18 1,300 600  19-50 1,000 600  51-70 1,200 600  71 and older 1,200 800  Data from Institute of Medicine. Dietary reference intakes: calcium, vitamin D. [place], [place]: National Academies Press; 2011.     Exercising to Stay Healthy To become healthy and stay healthy, it is recommended that you do moderate-intensity and vigorous-intensity exercise. You can tell that you are exercising at a moderate intensity if your heart starts beating faster and you start breathing faster but can still hold a conversation. You can tell that you are exercising at a vigorous intensity if you are breathing much harder and faster and cannot hold a conversation while exercising. Exercising regularly is important. It has many health benefits, such as:  Improving overall fitness, flexibility, and endurance.  Increasing bone density.  Helping with weight control.  Decreasing body fat.  Increasing muscle strength.  Reducing stress and tension.  Improving overall health. How often should I exercise? Choose an activity that you enjoy, and set realistic goals. Your health care provider can help you make an activity plan that works for you. Exercise regularly as told by your health care provider. This may include:  Doing strength training two times a week, such as: ? Lifting weights. ? Using resistance bands. ? Push-ups. ? Sit-ups. ? Yoga.  Doing a certain intensity of exercise for a given amount of time. Choose from these options: ? A total of 150 minutes of moderate-intensity exercise every week. ? A total of 75 minutes of vigorous-intensity exercise every week. ? A mix of moderate-intensity and vigorous-intensity exercise every week. Children, pregnant women, people who have not exercised  regularly, people who are overweight, and older adults may need to talk with a health care provider about what activities are safe to do. If you have a medical condition, be sure to talk with your health care provider before you start a new exercise program. What are some exercise ideas? Moderate-intensity exercise ideas include:  Walking 1 mile (1.6 km) in about 15 minutes.  Biking.  Hiking.  Golfing.  Dancing.  Water aerobics. Vigorous-intensity exercise ideas include:  Walking 4.5 miles (7.2 km) or more in about 1 hour.  Jogging or running 5 miles (8 km) in about 1 hour.  Biking 10 miles (16.1 km) or more in about 1 hour.  Lap swimming.  Roller-skating or in-line skating.  Cross-country skiing.  Vigorous competitive sports, such as football, basketball, and soccer.  Jumping rope.  Aerobic dancing. What are some everyday activities that can help me to get exercise?  Yard work, such as: ? Pushing a lawn mower. ? Raking and bagging leaves.  Washing your car.  Pushing a stroller.  Shoveling snow.  Gardening.  Washing windows or floors. How can I be more active in my day-to-day activities?  Use stairs instead of an elevator.  Take a walk during your lunch break.  If you drive, park your car farther away from your work or school.  If you take public transportation, get off one stop early and walk the rest of the way.  Stand up or walk around during all of your indoor phone calls.  Get up, stretch, and walk around every 30 minutes throughout the day.  Enjoy exercise with a friend. Support to continue exercising will help you keep a regular routine of activity. What guidelines can   I follow while exercising?  Before you start a new exercise program, talk with your health care provider.  Do not exercise so much that you hurt yourself, feel dizzy, or get very short of breath.  Wear comfortable clothes and wear shoes with good support.  Drink plenty of  water while you exercise to prevent dehydration or heat stroke.  Work out until your breathing and your heartbeat get faster. Where to find more information  U.S. Department of Health and Human Services: www.hhs.gov  Centers for Disease Control and Prevention (CDC): www.cdc.gov Summary  Exercising regularly is important. It will improve your overall fitness, flexibility, and endurance.  Regular exercise also will improve your overall health. It can help you control your weight, reduce stress, and improve your bone density.  Do not exercise so much that you hurt yourself, feel dizzy, or get very short of breath.  Before you start a new exercise program, talk with your health care provider. This information is not intended to replace advice given to you by your health care provider. Make sure you discuss any questions you have with your health care provider. Document Revised: 07/16/2017 Document Reviewed: 06/24/2017 Elsevier Patient Education  2020 Elsevier Inc.   Budget-Friendly Healthy Eating There are many ways to save money at the grocery store and continue to eat healthy. You can be successful if you:  Plan meals according to your budget.  Make a grocery list and only purchase food according to your grocery list.  Prepare food yourself. What are tips for following this plan?  Reading food labels  Compare food labels between brand name foods and the store brand. Often the nutritional value is the same, but the store brand is lower cost.  Look for products that do not have added sugar, fat, or salt (sodium). These often cost the same but are healthier for you. Products may be labeled as: ? Sugar-free. ? Nonfat. ? Low-fat. ? Sodium-free. ? Low-sodium.  Look for lean ground beef labeled as at least 92% lean and 8% fat. Shopping  Buy only the items on your grocery list and go only to the areas of the store that have the items on your list.  Use coupons only for foods  and brands you normally buy. Avoid buying items you wouldn't normally buy simply because they are on sale.  Check online and in newspapers for weekly deals.  Buy healthy items from the bulk bins when available, such as herbs, spices, flour, pasta, nuts, and dried fruit.  Buy fruits and vegetables that are in season. Prices are usually lower on in-season produce.  Look at the unit price on the price tag. Use it to compare different brands and sizes to find out which item is the best deal.  Choose healthy items that are often low-cost, such as carrots, potatoes, apples, bananas, and oranges. Dried or canned beans are a low-cost protein source.  Buy in bulk and freeze extra food. Items you can buy in bulk include meats, fish, poultry, frozen fruits, and frozen vegetables.  Avoid buying "ready-to-eat" foods, such as pre-cut fruits and vegetables and pre-made salads.  If possible, shop around to discover where you can find the best prices. Consider other retailers such as dollar stores, larger wholesale stores, local fruit and vegetable stands, and farmers markets.  Do not shop when you are hungry. If you shop while hungry, it may be hard to stick to your list and budget.  Resist impulse buying. Use your grocery list as   your official plan for the week.  Buy a variety of vegetables and fruits by purchasing fresh, frozen, and canned items.  Look at the top and bottom shelves for deals. Foods at eye level (eye level of an adult or child) are usually more expensive.  Be efficient with your time when shopping. The more time you spend at the store, the more money you are likely to spend.  To save money when choosing more expensive foods like meats and dairy: ? Choose cheaper cuts of meat, such as bone-in chicken thighs and drumsticks instead of skinless and boneless chicken. When you are ready to prepare the chicken, you can remove the skin yourself to make it healthier. ? Choose lean meats like  chicken or turkey instead of beef. ? Choose canned seafood, such as tuna, salmon, or sardines. ? Buy eggs as a low-cost source of protein. ? Buy dried beans and peas, such as lentils, split peas, or kidney beans instead of meats. Dried beans and peas are a good alternative source of protein. ? Buy the larger tubs of yogurt instead of individual-sized containers.  Choose water instead of sodas and other sweetened beverages.  Avoid buying chips, cookies, and other "junk food." These items are usually expensive and not healthy. Cooking  Make extra food and freeze the extras in meal-sized containers or in individual portions for fast meals and snacks.  Pre-cook on days when you have extra time to prepare meals in advance. You can keep these meals in the fridge or freezer and reheat for a quick meal.  When you come home from the grocery store, wash, peel, and cut fruits and vegetables so they are ready to use and eat. This will help reduce food waste. Meal planning  Do not eat out or get fast food. Prepare food at home.  Make a grocery list and make sure to bring it with you to the store. If you have a smart phone, you could use your phone to create your shopping list.  Plan meals and snacks according to a grocery list and budget you create.  Use leftovers in your meal plan for the week.  Look for recipes where you can cook once and make enough food for two meals.  Include budget-friendly meals like stews, casseroles, and stir-fry dishes.  Try some meatless meals or try "no cook" meals like salads.  Make sure that half your plate is filled with fruits or vegetables. Choose from fresh, frozen, or canned fruits and vegetables. If eating canned, remember to rinse them before eating. This will remove any excess salt added for packaging. Summary  Eating healthy on a budget is possible if you plan your meals according to your budget, purchase according to your budget and grocery list, and  prepare food yourself.  Tips for buying more food on a limited budget include buying generic brands, using coupons only for foods you normally buy, and buying healthy items from the bulk bins when available.  Tips for buying cheaper food to replace expensive food include choosing cheaper, lean cuts of meat, and buying dried beans and peas. This information is not intended to replace advice given to you by your health care provider. Make sure you discuss any questions you have with your health care provider. Document Revised: 08/04/2017 Document Reviewed: 08/04/2017 Elsevier Patient Education  2020 Elsevier Inc.   Bone Health Bones protect organs, store calcium, anchor muscles, and support the whole body. Keeping your bones strong is important, especially as you   get older. You can take actions to help keep your bones strong and healthy. Why is keeping my bones healthy important?  Keeping your bones healthy is important because your body constantly replaces bone cells. Cells get old, and new cells take their place. As we age, we lose bone cells because the body may not be able to make enough new cells to replace the old cells. The amount of bone cells and bone tissue you have is referred to as bone mass. The higher your bone mass, the stronger your bones. The aging process leads to an overall loss of bone mass in the body, which can increase the likelihood of:  Joint pain and stiffness.  Broken bones.  A condition in which the bones become weak and brittle (osteoporosis). A large decline in bone mass occurs in older adults. In women, it occurs about the time of menopause. What actions can I take to keep my bones healthy? Good health habits are important for maintaining healthy bones. This includes eating nutritious foods and exercising regularly. To have healthy bones, you need to get enough of the right minerals and vitamins. Most nutrition experts recommend getting these nutrients from the  foods that you eat. In some cases, taking supplements may also be recommended. Doing certain types of exercise is also important for bone health. What are the nutritional recommendations for healthy bones?  Eating a well-balanced diet with plenty of calcium and vitamin D will help to protect your bones. Nutritional recommendations vary from person to person. Ask your health care provider what is healthy for you. Here are some general guidelines. Get enough calcium Calcium is the most important (essential) mineral for bone health. Most people can get enough calcium from their diet, but supplements may be recommended for people who are at risk for osteoporosis. Good sources of calcium include:  Dairy products, such as low-fat or nonfat milk, cheese, and yogurt.  Dark green leafy vegetables, such as bok choy and broccoli.  Calcium-fortified foods, such as orange juice, cereal, bread, soy beverages, and tofu products.  Nuts, such as almonds. Follow these recommended amounts for daily calcium intake:  Children, age 1-3: 700 mg.  Children, age 4-8: 1,000 mg.  Children, age 9-13: 1,300 mg.  Teens, age 14-18: 1,300 mg.  Adults, age 19-50: 1,000 mg.  Adults, age 51-70: ? Men: 1,000 mg. ? Women: 1,200 mg.  Adults, age 71 or older: 1,200 mg.  Pregnant and breastfeeding females: ? Teens: 1,300 mg. ? Adults: 1,000 mg. Get enough vitamin D Vitamin D is the most essential vitamin for bone health. It helps the body absorb calcium. Sunlight stimulates the skin to make vitamin D, so be sure to get enough sunlight. If you live in a cold climate or you do not get outside often, your health care provider may recommend that you take vitamin D supplements. Good sources of vitamin D in your diet include:  Egg yolks.  Saltwater fish.  Milk and cereal fortified with vitamin D. Follow these recommended amounts for daily vitamin D intake:  Children and teens, age 1-18: 600 international  units.  Adults, age 50 or younger: 400-800 international units.  Adults, age 51 or older: 800-1,000 international units. Get other important nutrients Other nutrients that are important for bone health include:  Phosphorus. This mineral is found in meat, poultry, dairy foods, nuts, and legumes. The recommended daily intake for adult men and adult women is 700 mg.  Magnesium. This mineral is found in seeds, nuts, dark   green vegetables, and legumes. The recommended daily intake for adult men is 400-420 mg. For adult women, it is 310-320 mg.  Vitamin K. This vitamin is found in green leafy vegetables. The recommended daily intake is 120 mg for adult men and 90 mg for adult women. What type of physical activity is best for building and maintaining healthy bones? Weight-bearing and strength-building activities are important for building and maintaining healthy bones. Weight-bearing activities cause muscles and bones to work against gravity. Strength-building activities increase the strength of the muscles that support bones. Weight-bearing and muscle-building activities include:  Walking and hiking.  Jogging and running.  Dancing.  Gym exercises.  Lifting weights.  Tennis and racquetball.  Climbing stairs.  Aerobics. Adults should get at least 30 minutes of moderate physical activity on most days. Children should get at least 60 minutes of moderate physical activity on most days. Ask your health care provider what type of exercise is best for you. How can I find out if my bone mass is low? Bone mass can be measured with an X-ray test called a bone mineral density (BMD) test. This test is recommended for all women who are age 65 or older. It may also be recommended for:  Men who are age 70 or older.  People who are at risk for osteoporosis because of: ? Having bones that break easily. ? Having a long-term disease that weakens bones, such as kidney disease or rheumatoid  arthritis. ? Having menopause earlier than normal. ? Taking medicine that weakens bones, such as steroids, thyroid hormones, or hormone treatment for breast cancer or prostate cancer. ? Smoking. ? Drinking three or more alcoholic drinks a day. If you find that you have a low bone mass, you may be able to prevent osteoporosis or further bone loss by changing your diet and lifestyle. Where can I find more information? For more information, check out the following websites:  National Osteoporosis Foundation: www.nof.org/patients  National Institutes of Health: www.bones.nih.gov  International Osteoporosis Foundation: www.iofbonehealth.org Summary  The aging process leads to an overall loss of bone mass in the body, which can increase the likelihood of broken bones and osteoporosis.  Eating a well-balanced diet with plenty of calcium and vitamin D will help to protect your bones.  Weight-bearing and strength-building activities are also important for building and maintaining strong bones.  Bone mass can be measured with an X-ray test called a bone mineral density (BMD) test. This information is not intended to replace advice given to you by your health care provider. Make sure you discuss any questions you have with your health care provider. Document Revised: 08/30/2017 Document Reviewed: 08/30/2017 Elsevier Patient Education  2020 Elsevier Inc.   

## 2020-08-03 LAB — RPR: RPR Ser Ql: NONREACTIVE

## 2020-08-03 LAB — HEPATITIS PANEL, ACUTE
Hep A IgM: NEGATIVE
Hep B C IgM: NEGATIVE
Hep C Virus Ab: <0.1 s/co ratio (ref 0.0–0.9)
Hepatitis B Surface Ag: NEGATIVE

## 2020-08-03 LAB — HIV ANTIBODY (ROUTINE TESTING W REFLEX): HIV Screen 4th Generation wRfx: NONREACTIVE

## 2020-08-08 LAB — CYTOLOGY - PAP
Chlamydia: NEGATIVE
Comment: NEGATIVE
Comment: NEGATIVE
Comment: NORMAL
Neisseria Gonorrhea: NEGATIVE
Trichomonas: NEGATIVE

## 2020-09-10 ENCOUNTER — Telehealth: Payer: Self-pay

## 2020-09-10 ENCOUNTER — Other Ambulatory Visit: Payer: Self-pay | Admitting: Obstetrics and Gynecology

## 2020-09-10 ENCOUNTER — Ambulatory Visit: Payer: 59

## 2020-09-10 ENCOUNTER — Ambulatory Visit (INDEPENDENT_AMBULATORY_CARE_PROVIDER_SITE_OTHER): Payer: 59

## 2020-09-10 ENCOUNTER — Other Ambulatory Visit: Payer: Self-pay

## 2020-09-10 DIAGNOSIS — Z3042 Encounter for surveillance of injectable contraceptive: Secondary | ICD-10-CM | POA: Diagnosis not present

## 2020-09-10 MED ORDER — MEDROXYPROGESTERONE ACETATE 150 MG/ML IM SUSP
150.0000 mg | Freq: Once | INTRAMUSCULAR | Status: AC
  Administered 2020-09-10: 150 mg via INTRAMUSCULAR

## 2020-09-10 NOTE — Telephone Encounter (Signed)
Pt calling; problem with depo; had to change appt.  [phone]  Left detailed msg that refills for 44yr eRx'd at 8;20 this am and pharm received it.

## 2020-09-10 NOTE — Progress Notes (Signed)
Pt here for depo which was given IM right deltoid.  NDC# [phone]

## 2020-09-23 ENCOUNTER — Other Ambulatory Visit (HOSPITAL_COMMUNITY)
Admission: RE | Admit: 2020-09-23 | Discharge: 2020-09-23 | Disposition: A | Discharge status: Home / Self Care | Payer: 59 | Source: Ambulatory Visit | Attending: Obstetrics and Gynecology | Admitting: Obstetrics and Gynecology

## 2020-09-23 ENCOUNTER — Ambulatory Visit (INDEPENDENT_AMBULATORY_CARE_PROVIDER_SITE_OTHER): Payer: 59 | Admitting: Obstetrics and Gynecology

## 2020-09-23 ENCOUNTER — Other Ambulatory Visit: Payer: Self-pay

## 2020-09-23 ENCOUNTER — Encounter: Payer: Self-pay | Admitting: Obstetrics and Gynecology

## 2020-09-23 VITALS — BP 115/70 | Ht 63.0 in | Wt 156.8 lb

## 2020-09-23 DIAGNOSIS — R87612 Low grade squamous intraepithelial lesion on cytologic smear of cervix (LGSIL): Secondary | ICD-10-CM

## 2020-09-23 NOTE — Patient Instructions (Addendum)

## 2020-09-23 NOTE — Progress Notes (Signed)
   GYNECOLOGY CLINIC COLPOSCOPY PROCEDURE NOTE  25 y.o. G1P0010 here for colposcopy for low-grade squamous intraepithelial neoplasia (LGSIL - encompassing HPV,mild dysplasia,CIN I)  pap smear on 08/02/2020. Discussed underlying role for HPV infection in the development of cervical dysplasia, its natural history and progression/regression, need for surveillance.  Is the patient  pregnant: No LMP: No LMP recorded. Patient has had an injection. Smoking status:  reports that she has been smoking cigarettes. She has a 0.50 pack-year smoking history. She uses smokeless tobacco. Contraception: Depo-Provera injections Future fertility desired:  Yes  Patient given informed consent, signed copy in the chart, time out was performed.  The patient was position in dorsal lithotomy position. Speculum was placed the cervix was visualized.   After application of acetic acid colposcopic inspection of the cervix was undertaken.   Colposcopy adequate, full visualization of transformation zone: Yes acetowhite lesion(s) noted at 12, 3, 7 o'clock; corresponding biopsies obtained.   ECC specimen obtained:  Yes  All specimens were labeled and sent to pathology.   Patient was given post procedure instructions.  Will follow up pathology and manage accordingly.  Routine preventative health maintenance measures emphasized.  Physical Exam Genitourinary:     Exam conducted with a chaperone present.     Adrian Prows MD [facility], [facility] 09/23/2020 3:34 PM

## 2020-09-25 LAB — SURGICAL PATHOLOGY

## 2020-09-26 ENCOUNTER — Telehealth: Payer: Self-pay

## 2020-09-26 NOTE — Telephone Encounter (Signed)
Patient is calling for labs results. Please advise. 

## 2020-09-30 NOTE — Telephone Encounter (Signed)
Reviewed colposcopy result. 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 Repeating colposcopy in near future, or repeating pap smear in 1 year.  She would like to follow up in 6 months for a pap smear.  Encouraged smoking cessation.   Adrian Prows MD, [facility], [facility] 09/30/2020 3:12 PM

## 2020-09-30 NOTE — Telephone Encounter (Signed)
I returned call to patient- thank you

## 2020-10-15 ENCOUNTER — Ambulatory Visit: Payer: 59 | Admitting: Dermatology

## 2020-10-15 ENCOUNTER — Other Ambulatory Visit: Payer: Self-pay | Admitting: Obstetrics and Gynecology

## 2020-10-15 ENCOUNTER — Other Ambulatory Visit: Payer: Self-pay

## 2020-10-15 DIAGNOSIS — D489 Neoplasm of uncertain behavior, unspecified: Secondary | ICD-10-CM

## 2020-10-15 DIAGNOSIS — B001 Herpesviral vesicular dermatitis: Secondary | ICD-10-CM

## 2020-10-15 DIAGNOSIS — L720 Epidermal cyst: Secondary | ICD-10-CM | POA: Diagnosis not present

## 2020-10-15 DIAGNOSIS — D239 Other benign neoplasm of skin, unspecified: Secondary | ICD-10-CM

## 2020-10-15 DIAGNOSIS — K130 Diseases of lips: Secondary | ICD-10-CM

## 2020-10-15 DIAGNOSIS — B360 Pityriasis versicolor: Secondary | ICD-10-CM | POA: Diagnosis not present

## 2020-10-15 DIAGNOSIS — D225 Melanocytic nevi of trunk: Secondary | ICD-10-CM | POA: Diagnosis not present

## 2020-10-15 HISTORY — DX: Other benign neoplasm of skin, unspecified: D23.9

## 2020-10-15 MED ORDER — ALCLOMETASONE DIPROPIONATE 0.05 % EX OINT: TOPICAL_OINTMENT | CUTANEOUS | 1 refills | Status: DC

## 2020-10-15 MED ORDER — VALACYCLOVIR HCL 1 G PO TABS
ORAL_TABLET | ORAL | 1 refills | Status: DC
Start: 2020-10-15 — End: 2024-04-27

## 2020-10-15 MED ORDER — DOXYCYCLINE HYCLATE 100 MG PO TABS: 100.0000 mg | ORAL_TABLET | Freq: Two times a day (BID) | ORAL | 0 refills | Status: DC

## 2020-10-15 MED ORDER — KETOCONAZOLE 2 % EX CREA: TOPICAL_CREAM | CUTANEOUS | 1 refills | Status: DC

## 2020-10-15 MED ORDER — CLINDAMYCIN PHOSPHATE 1 % EX SOLN: CUTANEOUS | 0 refills | Status: DC

## 2020-10-15 NOTE — Telephone Encounter (Signed)
Patient f/u per pharmacy on valtrex refill denial. [phone]

## 2020-10-15 NOTE — Telephone Encounter (Signed)
Spoke w/patient. She states she is having a current cold sore that started 2 days ago.

## 2020-10-15 NOTE — Progress Notes (Signed)
Rx RF valtrex for prn cold sores

## 2020-10-15 NOTE — Telephone Encounter (Signed)
Annual with [facility] 07/2020

## 2020-10-15 NOTE — Progress Notes (Signed)
   New Patient Visit  Subjective  Emily Hernandez is a 25 y.o. female who presents for the following: Skin Problem (Patient here today for dark spots on her medial thighs. Spots came up a few months ago. Not itchy or bothersome. She also has very dry lips for years. She uses Chap-stick, but she picks at the skin on her lips which causes them to bleed. 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   The following portions of the chart were reviewed this encounter and updated as appropriate:       Review of Systems:  No other skin or systemic complaints except as noted in HPI or Assessment and Plan.  Objective  Well appearing patient in no apparent distress; mood and affect are within normal limits.  A focused examination was performed including face, trunk, extremities. Relevant physical exam findings are noted in the Assessment and Plan.  Objective  Right Medial Thigh: Tan pink scaly patch on left upper back, R mid back; tan scaly macules on medial thighs  Objective  Right vulva: Violaceous sq nodule.  Objective  Lips: Erythema and peeling of the lips.  Objective  Left Upper Back: 8.81mm brown macule with irregular pigment, pt not aware that mole was there        Assessment & Plan  Tinea versicolor Right Medial Thigh  Start ketoconazole 2% cream Apply to AA BID until improved.  Recommend Vanicream Z-Bar 2-3x/wk in the shower as preventative, sample given.  Benign chronic recurrent condition in areas that tend to sweat.  ketoconazole (NIZORAL) 2 % cream - Right Medial Thigh  Epidermal inclusion cyst Right vulva  Inflamed cyst Start doxycycline 100mg  take 1 po BID with food x 2 weeks #28 0Rf.  Start clindamycin solution Apply to AA QD/BID until improved.  Doxycycline should be taken with food to prevent nausea. Do not lay down for 30 minutes after taking. Be cautious with sun exposure and use good sun protection while on this medication.  Pregnant women should not take this medication.    doxycycline (VIBRA-TABS) 100 MG tablet - Right vulva  clindamycin (CLEOCIN T) 1 % external solution - Right vulva  Cheilitis Lips  Start alclometasone ointment Apply to lips BID until improved  Vanicream Ointment during the day.  Avoid picking.  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 Anesthesia: the lesion was anesthetized in a standard fashion   Anesthetic:  1% lidocaine w/ epinephrine 1-100,000 buffered w/ 8.4% NaHCO3 Instrument used: flexible razor blade   Hemostasis achieved with: pressure, aluminum chloride and electrodesiccation   Outcome: patient tolerated procedure well   Post-procedure details: wound care instructions given   Post-procedure details comment:  Ointment and small bandage applied  Specimen 1 - Surgical pathology Differential Diagnosis: Nevus r/o Dysplasia Check Margins: Yes 8.60mm brown macule with irregular pigment  Return in about 6 weeks (around 11/26/2020) for lips.   Documentation: I have reviewed the above documentation for accuracy and completeness, and I agree with the above.  Brendolyn Patty MD

## 2020-10-15 NOTE — Telephone Encounter (Signed)
Rx RF. Pt given total of #60 10/21. How frequently is she having to take it? If regularly, may do better with daily preventive. Thx.

## 2020-10-15 NOTE — Patient Instructions (Addendum)
Vanicream Z-Bar - Recommend using 2-3 times a week in the shower.   Tinea Versicolor  Tinea versicolor is a skin infection. It is caused by a type of yeast. It is normal for some yeast to be on your skin, but too much yeast causes this infection. The infection causes a rash of light or dark patches on your skin. The rash is most common on the chest, back, neck, or upper arms. The infection usually does not cause other problems. If it is treated, it will probably go away in a few weeks. The infection cannot be spread from one person to another (is not contagious). Follow these instructions at home:  Use over-the-counter and prescription medicines only as told by your doctor.  Scrub your skin every day with dandruff shampoo as told by your doctor.  Do not scratch your skin in the rash area.  Avoid places that are hot and humid.  Do not use tanning booths.  Try to avoid sweating a lot. Contact a doctor if:  Your symptoms get worse.  You have a fever.  You have redness, swelling, or pain in the rash area.  You have fluid or blood coming from your rash.  Your rash feels warm to the touch.  You have pus or a bad smell coming from your rash.  Your rash comes back (recurs) after treatment. Summary  Tinea versicolor is a skin infection. It causes a rash of light or dark patches on your skin.  The rash is most common on the chest, back, neck, or upper arms. This infection usually does not cause other problems.  Use over-the-counter and prescription medicines only as told by your doctor.  If the infection is treated, it will probably go away in a few weeks. This information is not intended to replace advice given to you by your health care provider. Make sure you discuss any questions you have with your health care provider. Document Revised: 05/29/2020 Document Reviewed: 05/29/2020 Elsevier Patient Education  2021 Elsevier Inc.    Cheilitis - lips  VaniCream Ointment - Apply  to lips during the day. Avoid picking.   Wound Care Instructions  7. Cleanse wound gently with soap and water once a day then pat dry with clean gauze. Apply a thing coat of Petrolatum (petroleum jelly, "Vaseline") over the wound (unless you have an allergy to this). We recommend that you use a new, sterile tube of Vaseline. Do not pick or remove scabs. Do not remove the yellow or white "healing tissue" from the base of the wound.  8. Cover the wound with fresh, clean, nonstick gauze and secure with paper tape. You may use Band-Aids in place of gauze and tape if the would is small enough, but would recommend trimming much of the tape off as there is often too much. Sometimes Band-Aids can irritate the skin.  9. You should call the office for your biopsy report after 1 week if you have not already been contacted.  10. If you experience any problems, such as abnormal amounts of bleeding, swelling, significant bruising, significant pain, or evidence of infection, please call the office immediately.  11. FOR ADULT SURGERY PATIENTS: If you need something for pain relief you may take 1 extra strength Tylenol (acetaminophen) AND 2 Ibuprofen (200mg  each) together every 4 hours as needed for pain. (do not take these if you are allergic to them or if you have a reason you should not take them.) Typically, you may only need pain medication  for 1 to 3 days.

## 2020-10-16 ENCOUNTER — Encounter: Payer: Self-pay | Admitting: Adult Health

## 2020-10-16 ENCOUNTER — Ambulatory Visit (INDEPENDENT_AMBULATORY_CARE_PROVIDER_SITE_OTHER): Payer: 59 | Admitting: Adult Health

## 2020-10-16 DIAGNOSIS — G4700 Insomnia, unspecified: Secondary | ICD-10-CM

## 2020-10-16 DIAGNOSIS — F331 Major depressive disorder, recurrent, moderate: Secondary | ICD-10-CM

## 2020-10-16 DIAGNOSIS — F411 Generalized anxiety disorder: Secondary | ICD-10-CM | POA: Diagnosis not present

## 2020-10-16 DIAGNOSIS — F319 Bipolar disorder, unspecified: Secondary | ICD-10-CM

## 2020-10-16 NOTE — Progress Notes (Addendum)
Emily Hernandez [id number] 23-May-1996 25 y.o.  Subjective:   Patient ID:  Emily Hernandez is a 25 y.o. (DOB 07-Oct-1995) female.  Chief Complaint: No chief complaint on file.   HPI KARMA ANSLEY presents to the office today for follow-up of MDD, GAD, insomnia, and Bipolar disorder.   Describes mood today as "ok". Pleasant. Mood symptoms - reports decreased depression, anxiety, and irritability. Mood remains stable. Stating "it's not as bad as it used to be". Still having to "deal" with a lot. Feels like she is "managing" things better. Feels like medications continue to work well. She and boyfriend doing well. Improved interest and motivation. Taking other medications as prescribed.  Energy levels stable. Active, does not have a regular exercise routine.  Enjoys some usual interests and activities. Single. Lives with boyfriend. Sister moved out. Mother in [place].  Appetite adequate. Weight loss - 160 pounds 63". Sleep varies with work schedule. Averages 4 to 8 hours.  Focus and concentration stable. Completing tasks. Managing aspects of household. 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  Denies SI or HI.  Denies AH or VH.  Previous medication trials: Deneen Harts, Lamictal, Topamax, Vraylar  GAD-7   Flowsheet Row Procedure visit from 09/23/2020 in [facility] Office Visit from 08/02/2020 in [facility]  Total GAD-7 Score 15 12    PHQ2-9   Flowsheet Row Procedure visit from 09/23/2020 in [facility] Office Visit from 08/02/2020 in [facility]  PHQ-2 Total Score 4 0  PHQ-9 Total Score 16 9       Review of Systems:  Review of Systems  Musculoskeletal: Negative for gait problem.  Neurological: Negative for tremors.  Psychiatric/Behavioral:       Please refer to HPI    Medications: I have reviewed the patient's current medications.  Current Outpatient Medications  Medication Sig Dispense Refill  . alclomethasone  (ACLOVATE) 0.05 % ointment Apply to lips twice a day until improved. 15 g 1  . buPROPion (WELLBUTRIN XL) 150 MG 24 hr tablet Take three tablets every morning. 270 tablet 1  . clindamycin (CLEOCIN T) 1 % external solution Apply to affected area groin 1-2 times a day until improved. 60 mL 0  . doxycycline (VIBRA-TABS) 100 MG tablet Take 1 tablet (100 mg total) by mouth 2 (two) times daily for 14 days. Take with food. 28 tablet 0  . ketoconazole (NIZORAL) 2 % cream Apply to affected areas rash on thighs and back twice daily until improved. 60 g 1  . lamoTRIgine (LAMICTAL) 100 MG tablet Take one tablet daily. 90 tablet 1  . lamoTRIgine (LAMICTAL) 200 MG tablet Take 1 tablet (200 mg total) by mouth at bedtime. 90 tablet 1  . medroxyPROGESTERone Acetate 150 MG/ML SUSY INJECT 1 ML (150 MG TOTAL) INTO THE MUSCLE ONCE FOR 1 DOSE. 1 mL 3  . topiramate (TOPAMAX) 100 MG tablet Take one tablet daily. 90 tablet 1  . valACYclovir (VALTREX) 1000 MG tablet Take 2 tabs BID x 1 day prn sx 30 tablet 1   No current facility-administered medications for this visit.    Medication Side Effects: None  Allergies:  Allergies  Allergen Reactions  . Penicillins     Swelling and Hives    Past Medical History:  Diagnosis Date  . Cold sore   . Idiopathic intracranial hypertension   . Vaccine for human papilloma virus (HPV) types 6, 11, 16, and 18 administered     Family History  Problem  Relation Age of Onset  . GI Bleed Maternal Grandmother   . Heart attack Maternal Grandmother   . Healthy Brother   . Breast cancer Neg Hx   . Ovarian cancer Neg Hx   . Colon cancer Neg Hx   . Diabetes Neg Hx     Social History   Socioeconomic History  . Marital status: Single    Spouse name: Not on file  . Number of children: 0  . Years of education: Not on file  . Highest education level: Not on file  Occupational History  . Occupation: server  Tobacco Use  . Smoking status: Current Every Day Smoker     Packs/day: 0.25    Years: 2.00    Pack years: 0.50    Types: Cigarettes    Last attempt to quit: 02/21/2018    Years since quitting: 2.6  . Smokeless tobacco: Current User    Last attempt to quit: 08/17/2012  Vaping Use  . Vaping Use: Every day  Substance and Sexual Activity  . Alcohol use: Yes  . Drug use: No  . Sexual activity: Yes    Birth control/protection: Injection  Other Topics Concern  . 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   Social Determinants of Health   Financial Resource Strain: Not on file  Food Insecurity: Not on file  Transportation Needs: Not on file  Physical Activity: Not on file  Stress: Not on file  Social Connections: Not on file  Intimate Partner Violence: Not on file    Past Medical History, Surgical history, Social history, and Family history were reviewed and updated as appropriate.   Please see review of systems for further details on the patient's review from today.   Objective:   Physical Exam:  There were no vitals taken for this visit.  Physical Exam Constitutional:      General: She is not in acute distress. Musculoskeletal:        General: No deformity.  Neurological:     Mental Status: She is alert and oriented to person, place, and time.     Coordination: Coordination normal.  Psychiatric:        Attention and Perception: Attention and perception normal. She does not perceive auditory or visual hallucinations.        Mood and Affect: Mood normal. Mood is not anxious or depressed. Affect is not labile, blunt, angry or inappropriate.        Speech: Speech normal.        Behavior: Behavior normal.        Thought Content: Thought content normal. Thought content is not paranoid or delusional. Thought content does not include homicidal or suicidal ideation. Thought content does not include homicidal or  suicidal plan.        Cognition and Memory: Cognition and memory normal.        Judgment: Judgment normal.     Comments: Insight intact     Lab Review:     Component Value Date/Time   NA 138 05/01/2019 1303   K 4.0 05/01/2019 1303   CL 106 05/01/2019 1303   CO2 22 05/01/2019 1303   GLUCOSE 94 05/01/2019 1303   BUN 13 05/01/2019 1303   CREATININE 0.85 05/01/2019 1303   CREATININE 0.75 01/30/2014 1309   CALCIUM 9.2 05/01/2019 1303   PROT 7.5  05/01/2019 1303   ALBUMIN 4.2 05/01/2019 1303   AST 14 (L) 05/01/2019 1303   ALT 15 05/01/2019 1303   ALKPHOS 43 05/01/2019 1303   BILITOT 0.5 05/01/2019 1303   GFRNONAA >60 05/01/2019 1303   GFRAA >60 05/01/2019 1303       Component Value Date/Time   WBC 6.3 05/01/2019 1303   RBC 4.88 05/01/2019 1303   HGB 14.2 05/01/2019 1303   HGB 13.4 03/04/2018 1009   HCT 44.3 05/01/2019 1303   HCT 41.2 03/04/2018 1009   PLT 183 05/01/2019 1303   PLT 233 03/04/2018 1009   MCV 90.8 05/01/2019 1303   MCV 91 03/04/2018 1009   MCH 29.1 05/01/2019 1303   MCHC 32.1 05/01/2019 1303   RDW 11.9 05/01/2019 1303   RDW 12.0 (L) 03/04/2018 1009   LYMPHSABS 1.4 03/04/2018 1009   MONOABS 0.4 09/03/2008 2035   EOSABS 0.0 03/04/2018 1009   BASOSABS 0.0 03/04/2018 1009    No results found for: POCLITH, LITHIUM   No results found for: PHENYTOIN, PHENOBARB, VALPROATE, CBMZ   .res Assessment: Plan:     Plan:  PDMP reviewed  1. Lamictal 200mg  daily 2. Topamax 100mg  at hs 3. Wellbutrin XL 300mg  daily for depression  4. Lamictal 100mg  daily  RTC 6 months  Patient advised to contact office with any questions, adverse effects, or acute worsening in signs and symptoms.  Counseled patient regarding potential benefits, risks, and side effects of Lamictal to include potential risk of Stevens-Johnson syndrome. Advised patient to stop taking Lamictal and contact office immediately if rash develops and to seek urgent medical attention if rash is severe  and/or spreading quickly.   Diagnoses and all orders for this visit:  Insomnia, unspecified type  Generalized anxiety disorder  Bipolar I disorder ([place])  Major depressive disorder, recurrent episode, moderate ([place])     Please see After Visit Summary for patient specific instructions.  Future Appointments  Date Time Provider [place]  11/26/2020 11:00 AM [facility] NURSE [facility] None  12/03/2020 10:15 AM Brendolyn Patty, MD [facility] None    No orders of the defined types were placed in this encounter.   -------------------------------

## 2020-10-18 NOTE — Telephone Encounter (Signed)
LMVM to notify patient.

## 2020-10-21 ENCOUNTER — Telehealth: Payer: Self-pay

## 2020-10-21 NOTE — Telephone Encounter (Signed)
Advised patient biopsy of the left upper back was a severe dysplastic nevus and needs surgery. Patient is driving and will call back to schedule.

## 2020-10-21 NOTE — Telephone Encounter (Signed)
-----   Message from Brendolyn Patty, MD sent at 10/18/2020 12:10 PM EST ----- Skin , left upper back [place], CLOSE TO MARGIN  The location is Left upper back not Low upper back- please call [place] to correct location Severely atypical mole- needs excision

## 2020-11-11 ENCOUNTER — Telehealth: Payer: Self-pay

## 2020-11-11 NOTE — Telephone Encounter (Signed)
-----   Message from Brendolyn Patty, MD sent at 10/21/2020  6:42 PM EST ----- Reviewed.  It has been corrected.

## 2020-11-11 NOTE — Telephone Encounter (Signed)
Spoke to patient about scheduling surgery appointment for severe dysplastic nevus of L upper back.  Pt said she wanted to check with her insurance to see what they will cover.  I advised to let us know when she spoke to insurance company so we could get her scheduled for excision./sh

## 2020-11-26 ENCOUNTER — Ambulatory Visit (INDEPENDENT_AMBULATORY_CARE_PROVIDER_SITE_OTHER): Payer: 59

## 2020-11-26 ENCOUNTER — Other Ambulatory Visit: Payer: Self-pay

## 2020-11-26 DIAGNOSIS — Z3042 Encounter for surveillance of injectable contraceptive: Secondary | ICD-10-CM

## 2020-11-26 MED ORDER — MEDROXYPROGESTERONE ACETATE 150 MG/ML IM SUSP
150.0000 mg | Freq: Once | INTRAMUSCULAR | Status: AC
  Administered 2020-11-26: 150 mg via INTRAMUSCULAR

## 2020-12-03 ENCOUNTER — Other Ambulatory Visit: Payer: Self-pay

## 2020-12-03 ENCOUNTER — Ambulatory Visit: Payer: 59 | Admitting: Dermatology

## 2020-12-03 DIAGNOSIS — K130 Diseases of lips: Secondary | ICD-10-CM | POA: Diagnosis not present

## 2020-12-03 DIAGNOSIS — B360 Pityriasis versicolor: Secondary | ICD-10-CM

## 2020-12-03 DIAGNOSIS — D235 Other benign neoplasm of skin of trunk: Secondary | ICD-10-CM | POA: Diagnosis not present

## 2020-12-03 DIAGNOSIS — D239 Other benign neoplasm of skin, unspecified: Secondary | ICD-10-CM

## 2020-12-03 DIAGNOSIS — L720 Epidermal cyst: Secondary | ICD-10-CM | POA: Diagnosis not present

## 2020-12-03 MED ORDER — TACROLIMUS 0.1 % EX OINT: TOPICAL_OINTMENT | Freq: Two times a day (BID) | CUTANEOUS | 1 refills | Status: DC

## 2020-12-03 MED ORDER — DOXYCYCLINE MONOHYDRATE 100 MG PO TABS: 100.0000 mg | ORAL_TABLET | Freq: Two times a day (BID) | ORAL | 0 refills | Status: AC

## 2020-12-03 NOTE — Progress Notes (Signed)
   Follow-Up Visit   Subjective  Emily Hernandez is a 25 y.o. female who presents for the following: 6 week follow up (Patient here today for 6 week follow up on rash. Patient reports rash is almost gone. ).  She still has cyst in groin.  It improved with treatment, then it got inflamed, and then burst.  Now healing.  She also has a  Dysplastic mole on her back, bx-proven severe atypia, that has not been treated yet.  Her lips are somewhat improved with treatment, but not cleared., they stay scaly.   The following portions of the chart were reviewed this encounter and updated as appropriate:       Objective  Well appearing patient in no apparent distress; mood and affect are within normal limits.  A focused examination was performed including face, lips, groin, back. Relevant physical exam findings are noted in the Assessment and Plan.  Objective  right vulva: Violaceous subcutaneous nodule on right vulva  Objective  right medial thigh: Clear today  Objective  Lips: Dry scaly lips  Objective  Left Upper Back: Healed pink biopsy site with focal repigmentation  Assessment & Plan  Epidermal inclusion cyst right vulva  Recent h/o inflammation Patient reports area burst a week ago and is improving  Recommend if it continues to bother patient may need to surgically remove.  Restart Clindamycin 1 % solution qd after shower Restart doxycycline 100 mg bid with food for 2 weeks   Cyst with symptoms and/or recent change.  Discussed surgical excision to remove, including resulting scar and possible recurrence.  Patient will schedule for surgery.  Doxycycline should be taken with food to prevent nausea. Do not lay down for 30 minutes after taking. Be cautious with sun exposure and use good sun protection while on this medication. Pregnant women should not take this medication.     doxycycline (ADOXA) 100 MG tablet - right vulva  Other Related Medications clindamycin  (CLEOCIN T) 1 % external solution  Tinea versicolor right medial thigh  Clear May recur Continue ketoconazole 2% cream bid prn recurrence  Other Related Medications ketoconazole (NIZORAL) 2 % cream  Cheilitis Lips  Some improvement, not at goal  Continue alclomethasone 0.05 % ointment using 1 - 2 times as needed for flares on lips  Tacrolimus 0.1% ointment - apply to dry areas of lips twice daily prn  Recommend vanicream moisturizing ointment otc for lips  Avoid picking, biting, licking  tacrolimus (PROTOPIC) 0.1 % ointment - Lips  Other Related Medications alclomethasone (ACLOVATE) 0.05 % ointment  Dysplastic nevus Left Upper Back  Biopsy proven dysplastic compound nevus with severe atypia close to margin. With recurrence  Results discussed with patient today and recommended surgery to remove.  Discussed resulting scar  Pt will schedule for surgery.  Return for schedule dysplastic mole surgery and epidermal cyst removal .  I, Emily Hernandez, CMA, am acting as scribe for Brendolyn Patty, MD.  Documentation: I have reviewed the above documentation for accuracy and completeness, and I agree with the above.  Brendolyn Patty MD

## 2020-12-03 NOTE — Patient Instructions (Addendum)
Doxycycline should be taken with food to prevent nausea. Do not lay down for 30 minutes after taking. Be cautious with sun exposure and use good sun protection while on this medication. Pregnant women should not take this medication.   Cyst with symptoms and/or recent change.  Discussed surgical excision to remove, including resulting scar and possible recurrence.  Patient will schedule for surgery. Pre-op information given.   Continue alclomethasone 0.05 % ointment using 1 - 2 times as needed for flares on lips  Tacrolimus ointment - apply to dry areas of lips twice daily   Recommend vanicream moisturizing ointment otc for lips  Avoid picking   If you have any questions or concerns for your doctor, please call our main line at [phone] and press option 4 to reach your doctor's medical assistant. If no one answers, please leave a voicemail as directed and we will return your call as soon as possible. Messages left after 4 pm will be answered the following business day.   You may also send [name] a message via [place]. We typically respond to MyChart messages within 1-2 business days.  For prescription refills, please ask your pharmacy to contact our office. Our fax number is [phone].  If you have an urgent issue when the clinic is closed that cannot wait until the next business day, you can page your doctor at the number below.    Please note that while we do our best to be available for urgent issues outside of office hours, we are not available 24/7.   If you have an urgent issue and are unable to reach [name], you may choose to seek medical care at your doctor's office, retail clinic, urgent care center, or emergency room.  If you have a medical emergency, please immediately call 911 or go to the emergency department.  Pager Numbers  - Dr. Nehemiah Massed: [phone]  - Dr. Laurence Ferrari: [phone]  - Dr. Nicole Kindred: [phone]  In the event of inclement weather, please call our main line  at [phone] for an update on the status of any delays or closures.  Dermatology Medication Tips: Please keep the boxes that topical medications come in in order to help keep track of the instructions about where and how to use these. Pharmacies typically print the medication instructions only on the boxes and not directly on the medication tubes.   If your medication is too expensive, please contact our office at [phone] option 4 or send [name] a message through [place].   We are unable to tell what your co-pay for medications will be in advance as this is different depending on your insurance coverage. However, we may be able to find a substitute medication at lower cost or fill out paperwork to get insurance to cover a needed medication.   If a prior authorization is required to get your medication covered by your insurance company, please allow [name] 1-2 business days to complete this process.  Drug prices often vary depending on where the prescription is filled and some pharmacies may offer cheaper prices.  The website www.goodrx.com contains coupons for medications through different pharmacies. The prices here do not account for what the cost may be with help from insurance (it may be cheaper with your insurance), but the website can give you the price if you did not use any insurance.  - You can print the associated coupon and take it with your prescription to the pharmacy.  - You may also stop by our office during regular  business hours and pick up a GoodRx coupon card.  - If you need your prescription sent electronically to a different pharmacy, notify our office through [facility] or by phone at [phone] option 4.

## 2020-12-16 ENCOUNTER — Telehealth: Payer: Self-pay

## 2020-12-16 NOTE — Telephone Encounter (Signed)
Fax from [facility] states Doxycycline Mono 100mg  tablet not covered by insurance. Called patient to see if she has picked up since then or do I need to send in alternative. No answer and left voicemail to return my call.

## 2020-12-24 ENCOUNTER — Other Ambulatory Visit: Payer: Self-pay | Admitting: Dermatology

## 2020-12-24 DIAGNOSIS — L720 Epidermal cyst: Secondary | ICD-10-CM

## 2021-02-03 ENCOUNTER — Ambulatory Visit (INDEPENDENT_AMBULATORY_CARE_PROVIDER_SITE_OTHER): Payer: 59 | Admitting: Dermatology

## 2021-02-03 ENCOUNTER — Other Ambulatory Visit: Payer: Self-pay

## 2021-02-03 DIAGNOSIS — D235 Other benign neoplasm of skin of trunk: Secondary | ICD-10-CM

## 2021-02-03 DIAGNOSIS — D239 Other benign neoplasm of skin, unspecified: Secondary | ICD-10-CM

## 2021-02-03 NOTE — Patient Instructions (Signed)
Wound Care Instructions  Cleanse wound gently with soap and water once a day then pat dry with clean gauze. Apply a thing coat of Petrolatum (petroleum jelly, "Vaseline") over the wound (unless you have an allergy to this). We recommend that you use a new, sterile tube of Vaseline. Do not pick or remove scabs. Do not remove the yellow or white "healing tissue" from the base of the wound.  Cover the wound with fresh, clean, nonstick gauze and secure with paper tape. You may use Band-Aids in place of gauze and tape if the would is small enough, but would recommend trimming much of the tape off as there is often too much. Sometimes Band-Aids can irritate the skin.  You should call the office for your biopsy report after 1 week if you have not already been contacted.  If you experience any problems, such as abnormal amounts of bleeding, swelling, significant bruising, significant pain, or evidence of infection, please call the office immediately.  FOR ADULT SURGERY PATIENTS: If you need something for pain relief you may take 1 extra strength Tylenol (acetaminophen) AND 2 Ibuprofen (200mg each) together every 4 hours as needed for pain. (do not take these if you are allergic to them or if you have a reason you should not take them.) Typically, you may only need pain medication for 1 to 3 days.   If you have any questions or concerns for your doctor, please call our main line at [phone] and press option 4 to reach your doctor's medical assistant. If no one answers, please leave a voicemail as directed and we will return your call as soon as possible. Messages left after 4 pm will be answered the following business day.   You may also send us a message via MyChart. We typically respond to MyChart messages within 1-2 business days.  For prescription refills, please ask your pharmacy to contact our office. Our fax number is [phone].  If you have an urgent issue when the clinic is closed that  cannot wait until the next business day, you can page your doctor at the number below.    Please note that while we do our best to be available for urgent issues outside of office hours, we are not available 24/7.   If you have an urgent issue and are unable to reach us, you may choose to seek medical care at your doctor's office, retail clinic, urgent care center, or emergency room.  If you have a medical emergency, please immediately call 911 or go to the emergency department.  Pager Numbers  - Dr. Kowalski: [phone]  - Dr. Moye: [phone]  - Dr. Stewart: [phone]  In the event of inclement weather, please call our main line at [phone] for an update on the status of any delays or closures.  Dermatology Medication Tips: Please keep the boxes that topical medications come in in order to help keep track of the instructions about where and how to use these. Pharmacies typically print the medication instructions only on the boxes and not directly on the medication tubes.   If your medication is too expensive, please contact our office at [phone] option 4 or send us a message through MyChart.   We are unable to tell what your co-pay for medications will be in advance as this is different depending on your insurance coverage. However, we may be able to find a substitute medication at lower cost or fill out paperwork to get insurance to cover a needed   medication.   If a prior authorization is required to get your medication covered by your insurance company, please allow us 1-2 business days to complete this process.  Drug prices often vary depending on where the prescription is filled and some pharmacies may offer cheaper prices.  The website www.goodrx.com contains coupons for medications through different pharmacies. The prices here do not account for what the cost may be with help from insurance (it may be cheaper with your insurance), but the website can give you the  price if you did not use any insurance.  - You can print the associated coupon and take it with your prescription to the pharmacy.  - You may also stop by our office during regular business hours and pick up a GoodRx coupon card.  - If you need your prescription sent electronically to a different pharmacy, notify our office through [place] MyChart or by phone at [phone] option 4.   

## 2021-02-03 NOTE — Progress Notes (Signed)
   Follow-Up Visit   Subjective  Emily Hernandez is a 25 y.o. female who presents for the following: Surgery (Patient presents for excision of severe dysplastic nevus of the left upper back.).    The following portions of the chart were reviewed this encounter and updated as appropriate:        Review of Systems:  No other skin or systemic complaints except as noted in HPI or Assessment and Plan.  Objective  Well appearing patient in no apparent distress; mood and affect are within normal limits.  A focused examination was performed including back. Relevant physical exam findings are noted in the Assessment and Plan.  Left Upper Back 1.0 cm pink firm flat papule with central pigmentation   Assessment & Plan  Dysplastic nevus Left Upper Back  Severe atypia, biopsy proven.  Skin excision - Left Upper Back  Lesion length (cm):  1 Lesion width (cm):  1 Margin per side (cm):  0.2 Total excision diameter (cm):  1.4 Informed consent: discussed and consent obtained   Timeout: patient name, date of birth, surgical site, and procedure verified   Procedure prep:  Patient was prepped and draped in usual sterile fashion Prep type:  Povidone-iodine Anesthesia: the lesion was anesthetized in a standard fashion   Anesthesia comment:  Total 14cc - 4cc lido w/epi, 10cc 0.5%bupivicaine Anesthetic:  1% lidocaine w/ epinephrine 1-100,000 buffered w/ 8.4% NaHCO3 (0.5% bupivicaine) Instrument used: #15 blade   Hemostasis achieved with: pressure and electrodesiccation   Outcome: patient tolerated procedure well with no complications    Skin repair - Left Upper Back Complexity:  Intermediate Final length (cm):  3.2 Informed consent: discussed and consent obtained   Timeout: patient name, date of birth, surgical site, and procedure verified   Reason for type of repair: reduce tension to allow closure, reduce the risk of dehiscence, infection, and necrosis and reduce subcutaneous dead  space and avoid a hematoma   Undermining: edges undermined   Subcutaneous layers (deep stitches):  Suture size:  3-0 Suture type: Vicryl (polyglactin 910)   Stitches:  Buried vertical mattress Fine/surface layer approximation (top stitches):  Suture size:  3-0 Suture type: nylon   Stitches: simple interrupted   Suture removal (days):  7 Hemostasis achieved with: suture Outcome: patient tolerated procedure well with no complications   Post-procedure details: sterile dressing applied and wound care instructions given   Dressing type: pressure dressing (mupirocin)   Additional details:  Tag 12:00 sup med tip  Specimen 1 - Surgical pathology Differential Diagnosis: Severe Dysplastic Nevus Check Margins: Yes 1.0 cm pink firm flat papule with central pigmentation [id number] Tag 12:00 superior medial tip  Return in about 1 week (around 02/10/2021) for suture removal and cyst excision.  IJamesetta Orleans, CMA, am acting as scribe for Brendolyn Patty, MD .  Documentation: I have reviewed the above documentation for accuracy and completeness, and I agree with the above.  Brendolyn Patty MD

## 2021-02-04 ENCOUNTER — Telehealth: Payer: Self-pay

## 2021-02-04 NOTE — Telephone Encounter (Signed)
Talked to patient and she is doing fine from surgery yesterday.  

## 2021-02-10 ENCOUNTER — Ambulatory Visit (INDEPENDENT_AMBULATORY_CARE_PROVIDER_SITE_OTHER): Payer: 59 | Admitting: Dermatology

## 2021-02-10 ENCOUNTER — Other Ambulatory Visit: Payer: Self-pay

## 2021-02-10 ENCOUNTER — Encounter: Payer: Self-pay | Admitting: Dermatology

## 2021-02-10 DIAGNOSIS — L720 Epidermal cyst: Secondary | ICD-10-CM

## 2021-02-10 DIAGNOSIS — D239 Other benign neoplasm of skin, unspecified: Secondary | ICD-10-CM

## 2021-02-10 DIAGNOSIS — D492 Neoplasm of unspecified behavior of bone, soft tissue, and skin: Secondary | ICD-10-CM

## 2021-02-10 DIAGNOSIS — D235 Other benign neoplasm of skin of trunk: Secondary | ICD-10-CM

## 2021-02-10 NOTE — Progress Notes (Signed)
Follow-Up Visit   Subjective  Emily Hernandez is a 25 y.o. female who presents for the following: Cyst (R vulva, pt presents for excision) and Dysplastic Nevus margins free (L upper back, pt presents for suture removal).   The following portions of the chart were reviewed this encounter and updated as appropriate:        Review of Systems:  No other skin or systemic complaints except as noted in HPI or Assessment and Plan.  Objective  Well appearing patient in no apparent distress; mood and affect are within normal limits.  A focused examination was performed including back, groin. Relevant physical exam findings are noted in the Assessment and Plan.  Left Upper Back Healing excision site  R vulva Pink pap 0.8 x 0.5cm   Assessment & Plan  Dysplastic nevus Left Upper Back  Margins free, bx proven  Encounter for Removal of Sutures - Incision site at the Left upper back is clean, dry and intact - Wound cleansed, sutures removed, wound cleansed and steri strips applied.  - Discussed pathology results showing Dysplastic Nevus margins free  - Patient advised to keep steri-strips dry until they fall off. - Scars remodel for a full year. - Once steri-strips fall off, patient can apply over-the-counter silicone scar cream each night to help with scar remodeling if desired. - Patient advised to call with any concerns or if they notice any new or changing lesions.   Neoplasm of skin R vulva  Skin excision  Lesion length (cm):  0.8 Lesion width (cm):  0.5 Margin per side (cm):  0.2 Total excision diameter (cm):  1.2 Informed consent: discussed and consent obtained   Timeout: patient name, date of birth, surgical site, and procedure verified   Procedure prep:  Patient was prepped and draped in usual sterile fashion Prep type:  Povidone-iodine Anesthesia: the lesion was anesthetized in a standard fashion   Anesthetic:  1% lidocaine w/ epinephrine 1-100,000 buffered w/  8.4% NaHCO3 (Lido w/epi 3cc, 0.5% bupivicaine 6, Total 9cc) Instrument used comment:  15c blade Hemostasis achieved with: pressure and electrodesiccation   Outcome: patient tolerated procedure well with no complications    Skin repair Complexity:  Intermediate Final length (cm):  2.4 Informed consent: discussed and consent obtained   Timeout: patient name, date of birth, surgical site, and procedure verified   Reason for type of repair: reduce tension to allow closure, reduce the risk of dehiscence, infection, and necrosis, reduce subcutaneous dead space and avoid a hematoma, preserve normal anatomical and functional relationships and enhance both functionality and cosmetic results   Undermining: edges undermined   Subcutaneous layers (deep stitches):  Suture size:  4-0 Suture type: Vicryl (polyglactin 910)   Subcutaneous suture technique: inverted dermal. Fine/surface layer approximation (top stitches):  Suture size:  4-0 Suture type: nylon   Stitches: simple interrupted   Suture removal (days):  7 Hemostasis achieved with: suture Outcome: patient tolerated procedure well with no complications   Post-procedure details: sterile dressing applied and wound care instructions given   Dressing type: pressure dressing (Mupirocin ointment)    Specimen 1 - Surgical pathology Differential Diagnosis: D48.5 Cyst vs other  Check Margins: No 0.8 x 0.5cm cystic pap  H/o inflammation, has improved with doxycycline, excision today  Return in about 1 week (around 02/17/2021) for suture removal.   I, Sonya Hupman, RMA, am acting as scribe for Brendolyn Patty, MD .  Documentation: I have reviewed the above documentation for accuracy and completeness, and I  agree with the above.  Brendolyn Patty MD

## 2021-02-10 NOTE — Patient Instructions (Addendum)
Wound Care Instructions  Cleanse wound gently with soap and water once a day then pat dry with clean gauze. Apply a thing coat of Petrolatum (petroleum jelly, "Vaseline") over the wound (unless you have an allergy to this). We recommend that you use a new, sterile tube of Vaseline. Do not pick or remove scabs. Do not remove the yellow or white "healing tissue" from the base of the wound.  Cover the wound with fresh, clean, nonstick gauze and secure with paper tape. You may use Band-Aids in place of gauze and tape if the would is small enough, but would recommend trimming much of the tape off as there is often too much. Sometimes Band-Aids can irritate the skin.  You should call the office for your biopsy report after 1 week if you have not already been contacted.  If you experience any problems, such as abnormal amounts of bleeding, swelling, significant bruising, significant pain, or evidence of infection, please call the office immediately.  FOR ADULT SURGERY PATIENTS: If you need something for pain relief you may take 1 extra strength Tylenol (acetaminophen) AND 2 Ibuprofen (200mg each) together every 4 hours as needed for pain. (do not take these if you are allergic to them or if you have a reason you should not take them.) Typically, you may only need pain medication for 1 to 3 days.   If you have any questions or concerns for your doctor, please call our main line at [phone] and press option 4 to reach your doctor's medical assistant. If no one answers, please leave a voicemail as directed and we will return your call as soon as possible. Messages left after 4 pm will be answered the following business day.   You may also send us a message via MyChart. We typically respond to MyChart messages within 1-2 business days.  For prescription refills, please ask your pharmacy to contact our office. Our fax number is [phone].  If you have an urgent issue when the clinic is closed that  cannot wait until the next business day, you can page your doctor at the number below.    Please note that while we do our best to be available for urgent issues outside of office hours, we are not available 24/7.   If you have an urgent issue and are unable to reach us, you may choose to seek medical care at your doctor's office, retail clinic, urgent care center, or emergency room.  If you have a medical emergency, please immediately call 911 or go to the emergency department.  Pager Numbers  - Dr. Kowalski: [phone]  - Dr. Moye: [phone]  - Dr. Stewart: [phone]  In the event of inclement weather, please call our main line at [phone] for an update on the status of any delays or closures.  Dermatology Medication Tips: Please keep the boxes that topical medications come in in order to help keep track of the instructions about where and how to use these. Pharmacies typically print the medication instructions only on the boxes and not directly on the medication tubes.   If your medication is too expensive, please contact our office at [phone] option 4 or send us a message through MyChart.   We are unable to tell what your co-pay for medications will be in advance as this is different depending on your insurance coverage. However, we may be able to find a substitute medication at lower cost or fill out paperwork to get insurance to cover a needed   medication.   If a prior authorization is required to get your medication covered by your insurance company, please allow us 1-2 business days to complete this process.  Drug prices often vary depending on where the prescription is filled and some pharmacies may offer cheaper prices.  The website www.goodrx.com contains coupons for medications through different pharmacies. The prices here do not account for what the cost may be with help from insurance (it may be cheaper with your insurance), but the website can give you the  price if you did not use any insurance.  - You can print the associated coupon and take it with your prescription to the pharmacy.  - You may also stop by our office during regular business hours and pick up a GoodRx coupon card.  - If you need your prescription sent electronically to a different pharmacy, notify our office through [facility] MyChart or by phone at [phone] option 4.   

## 2021-02-11 ENCOUNTER — Telehealth: Payer: Self-pay

## 2021-02-11 NOTE — Telephone Encounter (Signed)
Pt doing fine after yesterdays surgery./sh 

## 2021-02-18 ENCOUNTER — Other Ambulatory Visit: Payer: Self-pay

## 2021-02-18 ENCOUNTER — Ambulatory Visit: Payer: 59

## 2021-02-18 DIAGNOSIS — Z4802 Encounter for removal of sutures: Secondary | ICD-10-CM

## 2021-02-18 NOTE — Progress Notes (Signed)
   Follow-Up Visit   Subjective  Emily Hernandez is a 25 y.o. female who presents for the following: Suture / Staple Removal (Suture removal for excision on right vulva. Path pending).    The following portions of the chart were reviewed this encounter and updated as appropriate:         Objective  Well appearing patient in no apparent distress; mood and affect are within normal limits.    right vulva Incision site is clean, dry and intact   Assessment & Plan  Encounter for removal of sutures right vulva  Encounter for Removal of Sutures - Incision site at the right vulva is clean, dry and intact - Wound cleansed, sutures removed, wound cleansed and steri strips applied.  - Discussed pathology results showing pending  - Patient advised to keep steri-strips dry until they fall off. - Scars remodel for a full year. - Once steri-strips fall off, patient can apply over-the-counter silicone scar cream each night to help with scar remodeling if desired. - Patient advised to call with any concerns or if they notice any new or changing lesions.   No follow-ups on file.  I, Harriett Sine, CMA, am acting as scribe for [employer], CMA.

## 2021-02-18 NOTE — Patient Instructions (Signed)

## 2021-02-19 ENCOUNTER — Ambulatory Visit (INDEPENDENT_AMBULATORY_CARE_PROVIDER_SITE_OTHER): Payer: 59

## 2021-02-19 DIAGNOSIS — Z3042 Encounter for surveillance of injectable contraceptive: Secondary | ICD-10-CM

## 2021-02-19 MED ORDER — MEDROXYPROGESTERONE ACETATE 150 MG/ML IM SUSP
150.0000 mg | Freq: Once | INTRAMUSCULAR | Status: AC
  Administered 2021-02-19: 150 mg via INTRAMUSCULAR

## 2021-02-19 NOTE — Progress Notes (Signed)
Pt here for depo which was given IM right deltoid.  NDC# [id number]

## 2021-02-24 ENCOUNTER — Telehealth: Payer: Self-pay

## 2021-02-24 NOTE — Telephone Encounter (Signed)
-----   Message from Brendolyn Patty, MD sent at 02/24/2021 10:47 AM EDT ----- Skin (M), right vulva EPIDERMOID CYST, INFLAMED AND DISRUPTED  benign - please call pt

## 2021-04-06 ENCOUNTER — Other Ambulatory Visit: Payer: Self-pay | Admitting: Adult Health

## 2021-04-06 DIAGNOSIS — F331 Major depressive disorder, recurrent, moderate: Secondary | ICD-10-CM

## 2021-04-18 ENCOUNTER — Ambulatory Visit: Payer: 59 | Admitting: Adult Health

## 2021-04-29 ENCOUNTER — Encounter: Payer: Self-pay | Admitting: Adult Health

## 2021-04-29 ENCOUNTER — Ambulatory Visit (INDEPENDENT_AMBULATORY_CARE_PROVIDER_SITE_OTHER): Payer: 59 | Admitting: Adult Health

## 2021-04-29 ENCOUNTER — Other Ambulatory Visit: Payer: Self-pay

## 2021-04-29 DIAGNOSIS — F319 Bipolar disorder, unspecified: Secondary | ICD-10-CM | POA: Diagnosis not present

## 2021-04-29 DIAGNOSIS — G4700 Insomnia, unspecified: Secondary | ICD-10-CM

## 2021-04-29 DIAGNOSIS — F331 Major depressive disorder, recurrent, moderate: Secondary | ICD-10-CM | POA: Diagnosis not present

## 2021-04-29 DIAGNOSIS — F411 Generalized anxiety disorder: Secondary | ICD-10-CM | POA: Diagnosis not present

## 2021-04-29 MED ORDER — BUPROPION HCL ER (XL) 150 MG PO TB24: ORAL_TABLET | ORAL | 0 refills | Status: DC

## 2021-04-29 MED ORDER — TOPIRAMATE 100 MG PO TABS: ORAL_TABLET | ORAL | 1 refills | Status: DC

## 2021-04-29 MED ORDER — LAMOTRIGINE 100 MG PO TABS: ORAL_TABLET | ORAL | 1 refills | Status: DC

## 2021-04-29 MED ORDER — ARIPIPRAZOLE 5 MG PO TABS: ORAL_TABLET | ORAL | 2 refills | Status: DC

## 2021-04-29 MED ORDER — LAMOTRIGINE 200 MG PO TABS
200.0000 mg | ORAL_TABLET | Freq: Every day | ORAL | 1 refills | Status: DC
Start: 2021-04-29 — End: 2021-09-22

## 2021-04-29 NOTE — Progress Notes (Signed)
MALASIA POLO [id number] 26-Dec-1995 25 y.o.  Subjective:   Patient ID:  Emily Hernandez is a 25 y.o. (DOB April 09, 1996) female.  Chief Complaint: No chief complaint on file.   HPI JENNELL NASTRI presents to the office today for follow-up of MDD, GAD, insomnia, and Bipolar disorder.   Describes mood today as "ok". Pleasant. Mood symptoms - reports increase irritability - stopped vaping nicotine 3 weeks ago. Denies depression and anxiety. Mood has been level. Struggling with motivation. Wanting to clean her house, but can't do it. Making lists to help her, but doesn't do them. Stating "I'm not depressed, I just down have any motivation to get out of the bed". She and boyfriend doing well. Improved interest and motivation. Taking other medications as prescribed.  Energy levels lower. Active, does not have a regular exercise routine.  Enjoys some usual interests and activities. Single. Lives with boyfriend. Mother in [place].  Appetite adequate. Weight gain - 160+ pounds - 63". Sleep varies with work schedule. Averages 4 to 12 hours.  Focus and concentration stable. Completing tasks. Managing aspects of household. Works full-time - starting at [employer] next week. Working as a Chief Operating Officer on the weekends.  Denies SI or HI.  Denies AH or VH.  Previous medication trials: Deneen Harts, Lamictal, Topamax, Vraylar    GAD-7    Flowsheet Row Procedure visit from 09/23/2020 in [facility] Office Visit from 08/02/2020 in [facility]  Total GAD-7 Score 15 12      PHQ2-9    Flowsheet Row Procedure visit from 09/23/2020 in [facility] Office Visit from 08/02/2020 in [facility]  PHQ-2 Total Score 4 0  PHQ-9 Total Score 16 9        Review of Systems:  Review of Systems  Musculoskeletal:  Negative for gait problem.  Neurological:  Negative for tremors.  Psychiatric/Behavioral:         Please refer to HPI   Medications: I have  reviewed the patient's current medications.  Current Outpatient Medications  Medication Sig Dispense Refill   ARIPiprazole (ABILIFY) 5 MG tablet Take 1/2 tablet daily x 7 days, then increase to one tablet daily. 30 tablet 2   alclomethasone (ACLOVATE) 0.05 % ointment Apply to lips twice a day until improved. 15 g 1   buPROPion (WELLBUTRIN XL) 150 MG 24 hr tablet TAKE 3 TABLETS BY MOUTH EVERY MORNING 270 tablet 0   clindamycin (CLEOCIN T) 1 % external solution Apply to affected area groin 1-2 times a day until improved. 60 mL 0   ketoconazole (NIZORAL) 2 % cream Apply to affected areas rash on thighs and back twice daily until improved. 60 g 1   lamoTRIgine (LAMICTAL) 100 MG tablet Take one tablet daily. 90 tablet 1   lamoTRIgine (LAMICTAL) 200 MG tablet Take 1 tablet (200 mg total) by mouth at bedtime. 90 tablet 1   medroxyPROGESTERone Acetate 150 MG/ML SUSY INJECT 1 ML (150 MG TOTAL) INTO THE MUSCLE ONCE FOR 1 DOSE. 1 mL 3   tacrolimus (PROTOPIC) 0.1 % ointment Apply topically in the morning and at bedtime. Apply to dry areas of lips 30 g 1   topiramate (TOPAMAX) 100 MG tablet Take one tablet daily. 90 tablet 1   valACYclovir (VALTREX) 1000 MG tablet Take 2 tabs BID x 1 day prn sx 30 tablet 1   No current facility-administered medications for this visit.    Medication Side Effects: None  Allergies:  Allergies  Allergen Reactions  Penicillins     Swelling and Hives    Past Medical History:  Diagnosis Date   Cold sore    Dysplastic nevus 10/15/2020   Left upper back, severe. Exc 02/03/21 2:30pm.   Idiopathic intracranial hypertension    Vaccine for human papilloma virus (HPV) types 6, 11, 16, and 18 administered     Past Medical History, Surgical history, Social history, and Family history were reviewed and updated as appropriate.   Please see review of systems for further details on the patient's review from today.   Objective:   Physical Exam:  There were no vitals taken  for this visit.  Physical Exam Constitutional:      General: She is not in acute distress. Musculoskeletal:        General: No deformity.  Neurological:     Mental Status: She is alert and oriented to person, place, and time.     Coordination: Coordination normal.  Psychiatric:        Attention and Perception: Attention and perception normal. She does not perceive auditory or visual hallucinations.        Mood and Affect: Mood normal. Mood is not anxious or depressed. Affect is not labile, blunt, angry or inappropriate.        Speech: Speech normal.        Behavior: Behavior normal.        Thought Content: Thought content normal. Thought content is not paranoid or delusional. Thought content does not include homicidal or suicidal ideation. Thought content does not include homicidal or suicidal plan.        Cognition and Memory: Cognition and memory normal.        Judgment: Judgment normal.     Comments: Insight intact    Lab Review:     Component Value Date/Time   NA 138 05/01/2019 1303   K 4.0 05/01/2019 1303   CL 106 05/01/2019 1303   CO2 22 05/01/2019 1303   GLUCOSE 94 05/01/2019 1303   BUN 13 05/01/2019 1303   CREATININE 0.85 05/01/2019 1303   CREATININE 0.75 01/30/2014 1309   CALCIUM 9.2 05/01/2019 1303   PROT 7.5 05/01/2019 1303   ALBUMIN 4.2 05/01/2019 1303   AST 14 (L) 05/01/2019 1303   ALT 15 05/01/2019 1303   ALKPHOS 43 05/01/2019 1303   BILITOT 0.5 05/01/2019 1303   GFRNONAA >60 05/01/2019 1303   GFRAA >60 05/01/2019 1303       Component Value Date/Time   WBC 6.3 05/01/2019 1303   RBC 4.88 05/01/2019 1303   HGB 14.2 05/01/2019 1303   HGB 13.4 03/04/2018 1009   HCT 44.3 05/01/2019 1303   HCT 41.2 03/04/2018 1009   PLT 183 05/01/2019 1303   PLT 233 03/04/2018 1009   MCV 90.8 05/01/2019 1303   MCV 91 03/04/2018 1009   MCH 29.1 05/01/2019 1303   MCHC 32.1 05/01/2019 1303   RDW 11.9 05/01/2019 1303   RDW 12.0 (L) 03/04/2018 1009   LYMPHSABS 1.4  03/04/2018 1009   MONOABS 0.4 09/03/2008 2035   EOSABS 0.0 03/04/2018 1009   BASOSABS 0.0 03/04/2018 1009    No results found for: POCLITH, LITHIUM   No results found for: PHENYTOIN, PHENOBARB, VALPROATE, CBMZ   .res Assessment: Plan:    Plan:  PDMP reviewed  1. Lamictal '200mg'$  daily 2. Topamax '100mg'$  at hs 3. Wellbutrin XL '450mg'$  daily for depression  4. Lamictal '100mg'$  at hs 5. Add Abilify '5mg'$  - 1/2 tablet daily for 7 days, then  increase to one tablet daily  RTC 4 weeks  Patient advised to contact office with any questions, adverse effects, or acute worsening in signs and symptoms.  Counseled patient regarding potential benefits, risks, and side effects of Lamictal to include potential risk of Stevens-Johnson syndrome. Advised patient to stop taking Lamictal and contact office immediately if rash develops and to seek urgent medical attention if rash is severe and/or spreading quickly.  Diagnoses and all orders for this visit:  Insomnia, unspecified type  Bipolar I disorder (HCC) -     topiramate (TOPAMAX) 100 MG tablet; Take one tablet daily. -     lamoTRIgine (LAMICTAL) 100 MG tablet; Take one tablet daily. -     lamoTRIgine (LAMICTAL) 200 MG tablet; Take 1 tablet (200 mg total) by mouth at bedtime. -     ARIPiprazole (ABILIFY) 5 MG tablet; Take 1/2 tablet daily x 7 days, then increase to one tablet daily.  Major depressive disorder, recurrent episode, moderate (HCC) -     topiramate (TOPAMAX) 100 MG tablet; Take one tablet daily. -     buPROPion (WELLBUTRIN XL) 150 MG 24 hr tablet; TAKE 3 TABLETS BY MOUTH EVERY MORNING -     lamoTRIgine (LAMICTAL) 100 MG tablet; Take one tablet daily. -     lamoTRIgine (LAMICTAL) 200 MG tablet; Take 1 tablet (200 mg total) by mouth at bedtime. -     ARIPiprazole (ABILIFY) 5 MG tablet; Take 1/2 tablet daily x 7 days, then increase to one tablet daily.  Generalized anxiety disorder    Please see After Visit Summary for patient specific  instructions.  Future Appointments  Date Time Provider [place] Hamilton  05/14/2021 10:00 AM [facility] NURSE [facility] None  05/27/2021  4:40 PM Yamen Castrogiovanni, Berdie Ogren, NP [facility] None    No orders of the defined types were placed in this encounter.   -------------------------------

## 2021-05-14 ENCOUNTER — Ambulatory Visit: Payer: 59

## 2021-05-22 ENCOUNTER — Ambulatory Visit: Payer: 59

## 2021-05-23 ENCOUNTER — Ambulatory Visit (INDEPENDENT_AMBULATORY_CARE_PROVIDER_SITE_OTHER): Payer: 59

## 2021-05-23 ENCOUNTER — Other Ambulatory Visit: Payer: Self-pay

## 2021-05-23 DIAGNOSIS — Z3042 Encounter for surveillance of injectable contraceptive: Secondary | ICD-10-CM | POA: Diagnosis not present

## 2021-05-23 LAB — POCT URINE PREGNANCY: Preg Test, Ur: NEGATIVE

## 2021-05-23 MED ORDER — MEDROXYPROGESTERONE ACETATE 150 MG/ML IM SUSP
150.0000 mg | Freq: Once | INTRAMUSCULAR | Status: AC
  Administered 2021-05-23: 150 mg via INTRAMUSCULAR

## 2021-05-23 NOTE — Progress Notes (Signed)
Pt here for depo which was given IM right deltoid after obtaining neg urine preg test.  Pt tolerated inj well.  NDC# [id number]

## 2021-05-27 ENCOUNTER — Other Ambulatory Visit: Payer: Self-pay

## 2021-05-27 ENCOUNTER — Ambulatory Visit (INDEPENDENT_AMBULATORY_CARE_PROVIDER_SITE_OTHER): Payer: 59 | Admitting: Adult Health

## 2021-05-27 DIAGNOSIS — F902 Attention-deficit hyperactivity disorder, combined type: Secondary | ICD-10-CM

## 2021-05-27 DIAGNOSIS — F331 Major depressive disorder, recurrent, moderate: Secondary | ICD-10-CM | POA: Diagnosis not present

## 2021-05-27 DIAGNOSIS — F411 Generalized anxiety disorder: Secondary | ICD-10-CM

## 2021-05-27 DIAGNOSIS — G4700 Insomnia, unspecified: Secondary | ICD-10-CM | POA: Diagnosis not present

## 2021-05-27 DIAGNOSIS — F319 Bipolar disorder, unspecified: Secondary | ICD-10-CM

## 2021-05-27 MED ORDER — AMPHETAMINE-DEXTROAMPHET ER 15 MG PO CP24: 15.0000 mg | ORAL_CAPSULE | ORAL | 0 refills | Status: DC

## 2021-05-27 NOTE — Progress Notes (Signed)
Emily Hernandez [id number] 08-30-1995 25 y.o.  Subjective:   Patient ID:  Emily Hernandez is a 25 y.o. (DOB 04/20/1996) female.  Chief Complaint: No chief complaint on file.   HPI Emily Hernandez presents to the office today for follow-up of MDD, GAD, insomnia, and Bipolar disorder.   Describes mood today as "ok". Pleasant. Mood symptoms - denies depression. Feels anxious and irritable at times. Denies any mood instability. Stating "I'm doing ok, but frustrated". 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 Continues to have difficulties with completing tasks around the house. Stating "I'm really struggling". Talked with mother who has ADD and is feels like she also has symptoms. She and boyfriend doing well. Improved interest and motivation. Taking other medications as prescribed.  Energy levels lower. Active, does not have a regular exercise routine.  Enjoys some usual interests and activities. Single. Lives with boyfriend. Mother in [place].  Appetite adequate. Weight gain - 160+ pounds - 63". Sleep varies with work schedule. Averages 4 to 12 hours.  Focus and concentration difficulties. Completing tasks. Managing aspects of household. Works full-time -  at [employer]. Denies [place] or [place]. Denies AH or VH.   Previous medication trials: Deneen Harts, Lamictal, Topamax, Vraylar    GAD-7    Flowsheet Row Procedure visit from 09/23/2020 in [facility] Office Visit from 08/02/2020 in [facility]  Total GAD-7 Score 15 12      PHQ2-9    Flowsheet Row Procedure visit from 09/23/2020 in [facility] Office Visit from 08/02/2020 in [facility]  PHQ-2 Total Score 4 0  PHQ-9 Total Score 16 9        Review of Systems:  Review of Systems  Musculoskeletal:  Negative for gait problem.  Neurological:  Negative for tremors.  Psychiatric/Behavioral:         Please refer to HPI   Medications: I have  reviewed the patient's current medications.  Current Outpatient Medications  Medication Sig Dispense Refill   amphetamine-dextroamphetamine (ADDERALL XR) 15 MG 24 hr capsule Take 1 capsule by mouth every morning. 30 capsule 0   alclomethasone (ACLOVATE) 0.05 % ointment Apply to lips twice a day until improved. 15 g 1   buPROPion (WELLBUTRIN XL) 150 MG 24 hr tablet TAKE 3 TABLETS BY MOUTH EVERY MORNING 270 tablet 0   clindamycin (CLEOCIN T) 1 % external solution Apply to affected area groin 1-2 times a day until improved. 60 mL 0   ketoconazole (NIZORAL) 2 % cream Apply to affected areas rash on thighs and back twice daily until improved. 60 g 1   lamoTRIgine (LAMICTAL) 100 MG tablet Take one tablet daily. 90 tablet 1   lamoTRIgine (LAMICTAL) 200 MG tablet Take 1 tablet (200 mg total) by mouth at bedtime. 90 tablet 1   medroxyPROGESTERone Acetate 150 MG/ML SUSY INJECT 1 ML (150 MG TOTAL) INTO THE MUSCLE ONCE FOR 1 DOSE. 1 mL 3   tacrolimus (PROTOPIC) 0.1 % ointment Apply topically in the morning and at bedtime. Apply to dry areas of lips 30 g 1   topiramate (TOPAMAX) 100 MG tablet Take one tablet daily. 90 tablet 1   valACYclovir (VALTREX) 1000 MG tablet Take 2 tabs BID x 1 day prn sx 30 tablet 1   No current facility-administered medications for this visit.    Medication Side Effects: None  Allergies:  Allergies  Allergen Reactions   Penicillins     Swelling and  Hives    Past Medical History:  Diagnosis Date   Cold sore    Dysplastic nevus 10/15/2020   Left upper back, severe. Exc 02/03/21 2:30pm.   Idiopathic intracranial hypertension    Vaccine for human papilloma virus (HPV) types 6, 11, 16, and 18 administered     Past Medical History, Surgical history, Social history, and Family history were reviewed and updated as appropriate.   Please see review of systems for further details on the patient's review from today.   Objective:   Physical Exam:  There were no vitals  taken for this visit.  Physical Exam Constitutional:      General: She is not in acute distress. Musculoskeletal:        General: No deformity.  Neurological:     Mental Status: She is alert and oriented to person, place, and time.     Coordination: Coordination normal.  Psychiatric:        Attention and Perception: Attention and perception normal. She does not perceive auditory or visual hallucinations.        Mood and Affect: Mood normal. Mood is not anxious or depressed. Affect is not labile, blunt, angry or inappropriate.        Speech: Speech normal.        Behavior: Behavior normal.        Thought Content: Thought content normal. Thought content is not paranoid or delusional. Thought content does not include homicidal or suicidal ideation. Thought content does not include homicidal or suicidal plan.        Cognition and Memory: Cognition and memory normal.        Judgment: Judgment normal.     Comments: Insight intact    Lab Review:     Component Value Date/Time   NA 138 05/01/2019 1303   K 4.0 05/01/2019 1303   CL 106 05/01/2019 1303   CO2 22 05/01/2019 1303   GLUCOSE 94 05/01/2019 1303   BUN 13 05/01/2019 1303   CREATININE 0.85 05/01/2019 1303   CREATININE 0.75 01/30/2014 1309   CALCIUM 9.2 05/01/2019 1303   PROT 7.5 05/01/2019 1303   ALBUMIN 4.2 05/01/2019 1303   AST 14 (L) 05/01/2019 1303   ALT 15 05/01/2019 1303   ALKPHOS 43 05/01/2019 1303   BILITOT 0.5 05/01/2019 1303   GFRNONAA >60 05/01/2019 1303   GFRAA >60 05/01/2019 1303       Component Value Date/Time   WBC 6.3 05/01/2019 1303   RBC 4.88 05/01/2019 1303   HGB 14.2 05/01/2019 1303   HGB 13.4 03/04/2018 1009   HCT 44.3 05/01/2019 1303   HCT 41.2 03/04/2018 1009   PLT 183 05/01/2019 1303   PLT 233 03/04/2018 1009   MCV 90.8 05/01/2019 1303   MCV 91 03/04/2018 1009   MCH 29.1 05/01/2019 1303   MCHC 32.1 05/01/2019 1303   RDW 11.9 05/01/2019 1303   RDW 12.0 (L) 03/04/2018 1009   LYMPHSABS 1.4  03/04/2018 1009   MONOABS 0.4 09/03/2008 2035   EOSABS 0.0 03/04/2018 1009   BASOSABS 0.0 03/04/2018 1009    No results found for: POCLITH, LITHIUM   No results found for: PHENYTOIN, PHENOBARB, VALPROATE, CBMZ   .res Assessment: Plan:     Plan:  PDMP reviewed  1. Lamictal 200mg  daily 2. Topamax 100mg  at hs 3. Wellbutrin XL 450mg  daily for depression  4. Lamictal 100mg  at hs 5. D/C Abilify 5mg  - 1/2 tablet daily for 7 days, then increase to one tablet daily 6.  Add Adderall XR 15mg  every morning  RTC 4 weeks  [facility] ADHD screening completed 40/58 - ADHD likely  Patient advised to contact office with any questions, adverse effects, or acute worsening in signs and symptoms.  Counseled patient regarding potential benefits, risks, and side effects of Lamictal to include potential risk of Stevens-Johnson syndrome. Advised patient to stop taking Lamictal and contact office immediately if rash develops and to seek urgent medical attention if rash is severe and/or spreading quickly.  Diagnoses and all orders for this visit:  Bipolar I disorder ([place])  Major depressive disorder, recurrent episode, moderate (HCC)  Insomnia, unspecified type  Generalized anxiety disorder  Attention deficit hyperactivity disorder (ADHD), combined type -     amphetamine-dextroamphetamine (ADDERALL XR) 15 MG 24 hr capsule; Take 1 capsule by mouth every morning.    Please see After Visit Summary for patient specific instructions.  Future Appointments  Date Time Provider [place]  06/24/2021  4:20 PM Cordie Beazley, Berdie Ogren, NP CP-CP None  08/15/2021 10:00 AM [facility]STSIDE NURSE WS-WS None    No orders of the defined types were placed in this encounter.   -------------------------------

## 2021-05-28 ENCOUNTER — Encounter: Payer: Self-pay | Admitting: Adult Health

## 2021-06-03 ENCOUNTER — Other Ambulatory Visit: Payer: Self-pay | Admitting: Family Medicine

## 2021-06-03 ENCOUNTER — Other Ambulatory Visit: Payer: Self-pay

## 2021-06-03 ENCOUNTER — Ambulatory Visit
Admission: RE | Admit: 2021-06-03 | Discharge: 2021-06-03 | Disposition: A | Discharge status: Home / Self Care | Payer: 59 | Source: Ambulatory Visit | Attending: Family Medicine | Admitting: Family Medicine

## 2021-06-03 DIAGNOSIS — M5440 Lumbago with sciatica, unspecified side: Secondary | ICD-10-CM

## 2021-06-24 ENCOUNTER — Encounter: Payer: Self-pay | Admitting: Adult Health

## 2021-06-24 ENCOUNTER — Other Ambulatory Visit: Payer: Self-pay

## 2021-06-24 ENCOUNTER — Ambulatory Visit (INDEPENDENT_AMBULATORY_CARE_PROVIDER_SITE_OTHER): Payer: 59 | Admitting: Adult Health

## 2021-06-24 DIAGNOSIS — F331 Major depressive disorder, recurrent, moderate: Secondary | ICD-10-CM | POA: Diagnosis not present

## 2021-06-24 DIAGNOSIS — G4700 Insomnia, unspecified: Secondary | ICD-10-CM

## 2021-06-24 DIAGNOSIS — F411 Generalized anxiety disorder: Secondary | ICD-10-CM

## 2021-06-24 DIAGNOSIS — F902 Attention-deficit hyperactivity disorder, combined type: Secondary | ICD-10-CM | POA: Diagnosis not present

## 2021-06-24 DIAGNOSIS — F319 Bipolar disorder, unspecified: Secondary | ICD-10-CM

## 2021-06-24 MED ORDER — AMPHETAMINE-DEXTROAMPHET ER 15 MG PO CP24: 15.0000 mg | ORAL_CAPSULE | Freq: Two times a day (BID) | ORAL | 0 refills | Status: DC

## 2021-06-24 NOTE — Progress Notes (Signed)
Emily Hernandez [id number] 07/09/1996 25 y.o.  Subjective:   Patient ID:  Emily Hernandez is a 25 y.o. (DOB [birth year]) female.  Chief Complaint: No chief complaint on file.   HPI Emily Hernandez presents to the office today for follow-up of ADHD, MDD, GAD, insomnia, and Bipolar disorder.   Describes mood today as "ok". Pleasant. Mood symptoms - denies depression, anxiety, and irritability. Reports some mood swings. Gets frustrated in the work setting - still learning a lot at work. Feels like the addition of the Adderall has been helpful, but not lasting throughout the day. Feels like it wears off by lunchtime and she struggles the rest of the day. She and boyfriend doing well. Improved interest and motivation. Taking other medications as prescribed.  Energy levels about the same - "some days there and some days not". Active, does not have a regular exercise routine.  Enjoys some usual interests and activities. Single. Lives with boyfriend. Mother in [place].  Appetite adequate. Weight stable - 165 pounds - 63". Sleeps well most nights. Averages 8 hours.  Focus and concentration improved. Completing tasks. Managing aspects of household. Works full-time -  at [employer]. Denies [employer] or [place]. Denies AH or VH.   Previous medication trials: Deneen Harts, Lamictal, Topamax, Vraylar   GAD-7    Flowsheet Row Procedure visit from 09/23/2020 in [facility] Office Visit from 08/02/2020 in [facility]  Total GAD-7 Score 15 12      PHQ2-9    Flowsheet Row Procedure visit from 09/23/2020 in [facility] Office Visit from 08/02/2020 in [facility]  PHQ-2 Total Score 4 0  PHQ-9 Total Score 16 9        Review of Systems:  Review of Systems  Musculoskeletal:  Negative for gait problem.  Neurological:  Negative for tremors.  Psychiatric/Behavioral:         Please refer to HPI   Medications: I have reviewed the patient's current  medications.  Current Outpatient Medications  Medication Sig Dispense Refill   alclomethasone (ACLOVATE) 0.05 % ointment Apply to lips twice a day until improved. 15 g 1   amphetamine-dextroamphetamine (ADDERALL XR) 15 MG 24 hr capsule Take 1 capsule by mouth 2 (two) times daily. 30 capsule 0   buPROPion (WELLBUTRIN XL) 150 MG 24 hr tablet TAKE 3 TABLETS BY MOUTH EVERY MORNING 270 tablet 0   clindamycin (CLEOCIN T) 1 % external solution Apply to affected area groin 1-2 times a day until improved. 60 mL 0   ketoconazole (NIZORAL) 2 % cream Apply to affected areas rash on thighs and back twice daily until improved. 60 g 1   lamoTRIgine (LAMICTAL) 100 MG tablet Take one tablet daily. 90 tablet 1   lamoTRIgine (LAMICTAL) 200 MG tablet Take 1 tablet (200 mg total) by mouth at bedtime. 90 tablet 1   medroxyPROGESTERone Acetate 150 MG/ML SUSY INJECT 1 ML (150 MG TOTAL) INTO THE MUSCLE ONCE FOR 1 DOSE. 1 mL 3   tacrolimus (PROTOPIC) 0.1 % ointment Apply topically in the morning and at bedtime. Apply to dry areas of lips 30 g 1   topiramate (TOPAMAX) 100 MG tablet Take one tablet daily. 90 tablet 1   valACYclovir (VALTREX) 1000 MG tablet Take 2 tabs BID x 1 day prn sx 30 tablet 1   No current facility-administered medications for this visit.    Medication Side Effects: None  Allergies:  Allergies  Allergen Reactions   Penicillins  Swelling and Hives    Past Medical History:  Diagnosis Date   Cold sore    Dysplastic nevus 10/15/2020   Left upper back, severe. Exc 02/03/21 2:30pm.   Idiopathic intracranial hypertension    Vaccine for human papilloma virus (HPV) types 6, 11, 16, and 18 administered     Past Medical History, Surgical history, Social history, and Family history were reviewed and updated as appropriate.   Please see review of systems for further details on the patient's review from today.   Objective:   Physical Exam:  There were no vitals taken for this  visit.  Physical Exam Constitutional:      General: She is not in acute distress. Musculoskeletal:        General: No deformity.  Neurological:     Mental Status: She is alert and oriented to person, place, and time.     Coordination: Coordination normal.  Psychiatric:        Attention and Perception: Attention and perception normal. She does not perceive auditory or visual hallucinations.        Mood and Affect: Mood normal. Mood is not anxious or depressed. Affect is not labile, blunt, angry or inappropriate.        Speech: Speech normal.        Behavior: Behavior normal.        Thought Content: Thought content normal. Thought content is not paranoid or delusional. Thought content does not include homicidal or suicidal ideation. Thought content does not include homicidal or suicidal plan.        Cognition and Memory: Cognition and memory normal.        Judgment: Judgment normal.     Comments: Insight intact    Lab Review:     Component Value Date/Time   NA 138 05/01/2019 1303   K 4.0 05/01/2019 1303   CL 106 05/01/2019 1303   CO2 22 05/01/2019 1303   GLUCOSE 94 05/01/2019 1303   BUN 13 05/01/2019 1303   CREATININE 0.85 05/01/2019 1303   CREATININE 0.75 01/30/2014 1309   CALCIUM 9.2 05/01/2019 1303   PROT 7.5 05/01/2019 1303   ALBUMIN 4.2 05/01/2019 1303   AST 14 (L) 05/01/2019 1303   ALT 15 05/01/2019 1303   ALKPHOS 43 05/01/2019 1303   BILITOT 0.5 05/01/2019 1303   GFRNONAA >60 05/01/2019 1303   GFRAA >60 05/01/2019 1303       Component Value Date/Time   WBC 6.3 05/01/2019 1303   RBC 4.88 05/01/2019 1303   HGB 14.2 05/01/2019 1303   HGB 13.4 03/04/2018 1009   HCT 44.3 05/01/2019 1303   HCT 41.2 03/04/2018 1009   PLT 183 05/01/2019 1303   PLT 233 03/04/2018 1009   MCV 90.8 05/01/2019 1303   MCV 91 03/04/2018 1009   MCH 29.1 05/01/2019 1303   MCHC 32.1 05/01/2019 1303   RDW 11.9 05/01/2019 1303   RDW 12.0 (L) 03/04/2018 1009   LYMPHSABS 1.4 03/04/2018 1009    MONOABS 0.4 09/03/2008 2035   EOSABS 0.0 03/04/2018 1009   BASOSABS 0.0 03/04/2018 1009    No results found for: POCLITH, LITHIUM   No results found for: PHENYTOIN, PHENOBARB, VALPROATE, CBMZ   .res Assessment: Plan:     Plan:  PDMP reviewed  Lamictal 200mg  daily Topamax 100mg  at hs Wellbutrin XL 450mg  daily for depression  Lamictal 100mg  at hs Increase Adderall XR 15mg  every morning to BID  110/74/81  RTC 4 weeks  [facility] ADHD screening completed  40/58 - ADHD likely  Patient advised to contact office with any questions, adverse effects, or acute worsening in signs and symptoms.  Counseled patient regarding potential benefits, risks, and side effects of Lamictal to include potential risk of Stevens-Johnson syndrome. Advised patient to stop taking Lamictal and contact office immediately if rash develops and to seek urgent medical attention if rash is severe and/or spreading quickly.    Diagnoses and all orders for this visit:  Bipolar I disorder ([place])  Attention deficit hyperactivity disorder (ADHD), combined type -     amphetamine-dextroamphetamine (ADDERALL XR) 15 MG 24 hr capsule; Take 1 capsule by mouth 2 (two) times daily.  Major depressive disorder, recurrent episode, moderate (HCC)  Insomnia, unspecified type  Generalized anxiety disorder    Please see After Visit Summary for patient specific instructions.  Future Appointments  Date Time Provider [place]  07/18/2021  9:30 AM [name], MD PSS-PSS None  08/15/2021 10:00 AM [facility] NURSE WS-WS None    No orders of the defined types were placed in this encounter.   -------------------------------

## 2021-06-25 ENCOUNTER — Telehealth: Payer: Self-pay | Admitting: Adult Health

## 2021-06-25 NOTE — Telephone Encounter (Signed)
Next visit is 07/22/21. Ashritha said that her pharmacy told her that her Adderall XR 15 mg won't be covered for 2/day but will only cover 1/day. She is requesting another RX to be rewritten and sent in. Pharmacy is:  [id number] - [place], [place] - [address]  Phone:  [phone]  Fax:  [phone]     I sent a message to Traci to see if she has a prior auth on this?

## 2021-06-25 NOTE — Telephone Encounter (Signed)
Please let me know if you are working on a PA or if gina needs to change the dose

## 2021-06-25 NOTE — Telephone Encounter (Signed)
Next visit is  

## 2021-06-26 NOTE — Telephone Encounter (Signed)
Prior Authorization initiated and submitted to [place] MD#[id number] for Adderall XR 15 mg #60 Determination is still pending at this time.

## 2021-06-29 ENCOUNTER — Other Ambulatory Visit: Payer: Self-pay

## 2021-06-29 DIAGNOSIS — F902 Attention-deficit hyperactivity disorder, combined type: Secondary | ICD-10-CM

## 2021-06-29 NOTE — Telephone Encounter (Signed)
Prior Approval received on ADDERALL XR #60 effective 06/27/2021-06/26/2022 with [employer] ID# [id number]

## 2021-06-30 MED ORDER — AMPHETAMINE-DEXTROAMPHET ER 15 MG PO CP24: 15.0000 mg | ORAL_CAPSULE | Freq: Two times a day (BID) | ORAL | 0 refills | Status: DC

## 2021-07-18 ENCOUNTER — Institutional Professional Consult (permissible substitution): Payer: 59 | Admitting: Plastic Surgery

## 2021-07-22 ENCOUNTER — Other Ambulatory Visit: Payer: Self-pay

## 2021-07-22 ENCOUNTER — Ambulatory Visit (INDEPENDENT_AMBULATORY_CARE_PROVIDER_SITE_OTHER): Payer: 59 | Admitting: Adult Health

## 2021-07-22 ENCOUNTER — Encounter: Payer: Self-pay | Admitting: Adult Health

## 2021-07-22 DIAGNOSIS — F331 Major depressive disorder, recurrent, moderate: Secondary | ICD-10-CM

## 2021-07-22 DIAGNOSIS — G4700 Insomnia, unspecified: Secondary | ICD-10-CM | POA: Diagnosis not present

## 2021-07-22 DIAGNOSIS — F319 Bipolar disorder, unspecified: Secondary | ICD-10-CM

## 2021-07-22 DIAGNOSIS — F902 Attention-deficit hyperactivity disorder, combined type: Secondary | ICD-10-CM

## 2021-07-22 DIAGNOSIS — F411 Generalized anxiety disorder: Secondary | ICD-10-CM

## 2021-07-22 MED ORDER — AMPHETAMINE-DEXTROAMPHETAMINE 20 MG PO TABS: 20.0000 mg | ORAL_TABLET | Freq: Two times a day (BID) | ORAL | 0 refills | Status: DC

## 2021-07-22 MED ORDER — BUPROPION HCL ER (XL) 150 MG PO TB24: ORAL_TABLET | ORAL | 1 refills | Status: DC

## 2021-07-22 NOTE — Progress Notes (Signed)
Emily Hernandez [id number] Jun 22, 1996 25 y.o.  Subjective:   Patient ID:  Emily Hernandez is a 25 y.o. (DOB 11-04-1995) female.  Chief Complaint: No chief complaint on file.   HPI Emily Hernandez presents to the office today for follow-up of ADHD, MDD, GAD, insomnia, and Bipolar disorder.   Describes mood today as "ok". Pleasant. Mood symptoms - denies depression, anxiety, and irritability. Reports moods are consistent. Stating "I'm doing alright". Has been taking the Adderall XR 20mg  twice daily for the last month - does not feel like it as helpful as it was initially. Would like to try the IR formula for the next month. 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 She and boyfriend doing well. Improved interest and motivation. Taking other medications as prescribed.  Energy levels stable. Active, does not have a regular exercise routine.  Enjoys some usual interests and activities. Single. Lives with boyfriend. Mother in [place].  Appetite adequate. Weight stable - 165 pounds - 63". Sleeps well most nights. Averages 8 hours.  Focus and concentration improved. Completing tasks. Managing aspects of household. Works full-time -  at [employer]. Denies [place] or [place]. Denies AH or VH.   Previous medication trials: Deneen Harts, Lamictal, Topamax, Vraylar     GAD-7    Flowsheet Row Procedure visit from 09/23/2020 in [facility] Office Visit from 08/02/2020 in [facility]  Total GAD-7 Score 15 12      PHQ2-9    Flowsheet Row Procedure visit from 09/23/2020 in [facility] Office Visit from 08/02/2020 in [facility]  PHQ-2 Total Score 4 0  PHQ-9 Total Score 16 9        Review of Systems:  Review of Systems  Musculoskeletal:  Negative for gait problem.  Neurological:  Negative for tremors.  Psychiatric/Behavioral:         Please refer to HPI   Medications: I have reviewed the patient's current  medications.  Current Outpatient Medications  Medication Sig Dispense Refill   amphetamine-dextroamphetamine (ADDERALL) 20 MG tablet Take 1 tablet (20 mg total) by mouth 2 (two) times daily. 60 tablet 0   alclomethasone (ACLOVATE) 0.05 % ointment Apply to lips twice a day until improved. 15 g 1   amphetamine-dextroamphetamine (ADDERALL XR) 15 MG 24 hr capsule Take 1 capsule by mouth 2 (two) times daily. 60 capsule 0   buPROPion (WELLBUTRIN XL) 150 MG 24 hr tablet TAKE 3 TABLETS BY MOUTH EVERY MORNING 270 tablet 1   clindamycin (CLEOCIN T) 1 % external solution Apply to affected area groin 1-2 times a day until improved. 60 mL 0   ketoconazole (NIZORAL) 2 % cream Apply to affected areas rash on thighs and back twice daily until improved. 60 g 1   lamoTRIgine (LAMICTAL) 100 MG tablet Take one tablet daily. 90 tablet 1   lamoTRIgine (LAMICTAL) 200 MG tablet Take 1 tablet (200 mg total) by mouth at bedtime. 90 tablet 1   medroxyPROGESTERone Acetate 150 MG/ML SUSY INJECT 1 ML (150 MG TOTAL) INTO THE MUSCLE ONCE FOR 1 DOSE. 1 mL 3   tacrolimus (PROTOPIC) 0.1 % ointment Apply topically in the morning and at bedtime. Apply to dry areas of lips 30 g 1   topiramate (TOPAMAX) 100 MG tablet Take one tablet daily. 90 tablet 1   valACYclovir (VALTREX) 1000 MG tablet Take 2 tabs BID x 1 day prn sx 30 tablet 1   No current facility-administered medications for this  visit.    Medication Side Effects: None  Allergies:  Allergies  Allergen Reactions   Penicillins     Swelling and Hives    Past Medical History:  Diagnosis Date   Cold sore    Dysplastic nevus 10/15/2020   Left upper back, severe. Exc 02/03/21 2:30pm.   Idiopathic intracranial hypertension    Vaccine for human papilloma virus (HPV) types 6, 11, 16, and 18 administered     Past Medical History, Surgical history, Social history, and Family history were reviewed and updated as appropriate.   Please see review of systems for further  details on the patient's review from today.   Objective:   Physical Exam:  There were no vitals taken for this visit.  Physical Exam Constitutional:      General: She is not in acute distress. Musculoskeletal:        General: No deformity.  Neurological:     Mental Status: She is alert and oriented to person, place, and time.     Coordination: Coordination normal.  Psychiatric:        Attention and Perception: Attention and perception normal. She does not perceive auditory or visual hallucinations.        Mood and Affect: Mood normal. Mood is not anxious or depressed. Affect is not labile, blunt, angry or inappropriate.        Speech: Speech normal.        Behavior: Behavior normal.        Thought Content: Thought content normal. Thought content is not paranoid or delusional. Thought content does not include homicidal or suicidal ideation. Thought content does not include homicidal or suicidal plan.        Cognition and Memory: Cognition and memory normal.        Judgment: Judgment normal.     Comments: Insight intact    Lab Review:     Component Value Date/Time   NA 138 05/01/2019 1303   K 4.0 05/01/2019 1303   CL 106 05/01/2019 1303   CO2 22 05/01/2019 1303   GLUCOSE 94 05/01/2019 1303   BUN 13 05/01/2019 1303   CREATININE 0.85 05/01/2019 1303   CREATININE 0.75 01/30/2014 1309   CALCIUM 9.2 05/01/2019 1303   PROT 7.5 05/01/2019 1303   ALBUMIN 4.2 05/01/2019 1303   AST 14 (L) 05/01/2019 1303   ALT 15 05/01/2019 1303   ALKPHOS 43 05/01/2019 1303   BILITOT 0.5 05/01/2019 1303   GFRNONAA >60 05/01/2019 1303   GFRAA >60 05/01/2019 1303       Component Value Date/Time   WBC 6.3 05/01/2019 1303   RBC 4.88 05/01/2019 1303   HGB 14.2 05/01/2019 1303   HGB 13.4 03/04/2018 1009   HCT 44.3 05/01/2019 1303   HCT 41.2 03/04/2018 1009   PLT 183 05/01/2019 1303   PLT 233 03/04/2018 1009   MCV 90.8 05/01/2019 1303   MCV 91 03/04/2018 1009   MCH 29.1 05/01/2019 1303    MCHC 32.1 05/01/2019 1303   RDW 11.9 05/01/2019 1303   RDW 12.0 (L) 03/04/2018 1009   LYMPHSABS 1.4 03/04/2018 1009   MONOABS 0.4 09/03/2008 2035   EOSABS 0.0 03/04/2018 1009   BASOSABS 0.0 03/04/2018 1009    No results found for: POCLITH, LITHIUM   No results found for: PHENYTOIN, PHENOBARB, VALPROATE, CBMZ   .res Assessment: Plan:    Plan:  PDMP reviewed  Lamictal 200mg  daily Topamax 100mg  at hs Wellbutrin XL 450mg  daily for depression  Lamictal 100mg  at  hs Increase Adderall XR 15mg  every morning to BID  110/74/81  RTC 4 weeks  [facility] ADHD screening completed 40/58 - ADHD likely  Patient advised to contact office with any questions, adverse effects, or acute worsening in signs and symptoms.  Counseled patient regarding potential benefits, risks, and side effects of Lamictal to include potential risk of Stevens-Johnson syndrome. Advised patient to stop taking Lamictal and contact office immediately if rash develops and to seek urgent medical attention if rash is severe and/or spreading quickly.   Diagnoses and all orders for this visit:  Bipolar I disorder ([place])  Attention deficit hyperactivity disorder (ADHD), combined type -     amphetamine-dextroamphetamine (ADDERALL) 20 MG tablet; Take 1 tablet (20 mg total) by mouth 2 (two) times daily.  Major depressive disorder, recurrent episode, moderate (HCC) -     buPROPion (WELLBUTRIN XL) 150 MG 24 hr tablet; TAKE 3 TABLETS BY MOUTH EVERY MORNING  Insomnia, unspecified type  Generalized anxiety disorder    Please see After Visit Summary for patient specific instructions.  Future Appointments  Date Time Provider [place]  08/15/2021 10:00 AM [facility] NURSE WS-WS None  08/15/2021  2:00 PM [name], MD PSS-PSS None  08/25/2021  1:00 PM Jakaleb Payer, Berdie Ogren, NP CP-CP None    No orders of the defined types were placed in this encounter.   -------------------------------

## 2021-07-31 ENCOUNTER — Other Ambulatory Visit: Payer: Self-pay | Admitting: Obstetrics and Gynecology

## 2021-07-31 DIAGNOSIS — B001 Herpesviral vesicular dermatitis: Secondary | ICD-10-CM

## 2021-08-15 ENCOUNTER — Institutional Professional Consult (permissible substitution): Payer: 59 | Admitting: Plastic Surgery

## 2021-08-15 ENCOUNTER — Ambulatory Visit: Payer: 59

## 2021-08-19 ENCOUNTER — Ambulatory Visit: Payer: Self-pay

## 2021-08-20 ENCOUNTER — Ambulatory Visit: Payer: Self-pay

## 2021-08-21 ENCOUNTER — Telehealth: Payer: Self-pay | Admitting: Adult Health

## 2021-08-21 NOTE — Telephone Encounter (Signed)
I tried to call and find out if she had the vyvanse already or if you needed to send rx but no answer and no vm set up.

## 2021-08-21 NOTE — Telephone Encounter (Signed)
Please review

## 2021-08-21 NOTE — Telephone Encounter (Signed)
That's fine

## 2021-08-21 NOTE — Telephone Encounter (Signed)
Pt called and she did not get the Adderall Gina prescribed because her pharmacy didn't have it for 2 wks.  Then the last 2 wks, she didn't have any insurance.  Now she has insurance, but she wants to know if she can just try the Vyvanse.  If so, then she would need to reschedule her visit currently set for Mon 1/9 in order for her to get on the medicine and give it time to work.  Pls advise.

## 2021-08-21 NOTE — Telephone Encounter (Signed)
Ok try again tomorrow.

## 2021-08-22 ENCOUNTER — Other Ambulatory Visit: Payer: Self-pay

## 2021-08-22 MED ORDER — LISDEXAMFETAMINE DIMESYLATE 30 MG PO CAPS: 30.0000 mg | ORAL_CAPSULE | Freq: Every day | ORAL | 0 refills | Status: DC

## 2021-08-22 NOTE — Telephone Encounter (Signed)
Attempted to reach pt no answer 

## 2021-08-22 NOTE — Telephone Encounter (Signed)
30mg.

## 2021-08-22 NOTE — Telephone Encounter (Signed)
We can but it will require a PA.

## 2021-08-22 NOTE — Telephone Encounter (Signed)
Ok what is the dose please so I can pend

## 2021-08-22 NOTE — Telephone Encounter (Signed)
Pt does not have Rx already.Please let me know the dose you wanted her on and I will pend

## 2021-08-25 ENCOUNTER — Ambulatory Visit: Payer: 59 | Admitting: Adult Health

## 2021-08-26 ENCOUNTER — Ambulatory Visit (INDEPENDENT_AMBULATORY_CARE_PROVIDER_SITE_OTHER): Payer: 59

## 2021-08-26 ENCOUNTER — Other Ambulatory Visit: Payer: Self-pay

## 2021-08-26 DIAGNOSIS — Z3042 Encounter for surveillance of injectable contraceptive: Secondary | ICD-10-CM

## 2021-08-26 LAB — POCT URINE PREGNANCY: Preg Test, Ur: NEGATIVE

## 2021-08-26 MED ORDER — MEDROXYPROGESTERONE ACETATE 150 MG/ML IM SUSP
150.0000 mg | Freq: Once | INTRAMUSCULAR | Status: AC
  Administered 2021-08-26: 150 mg via INTRAMUSCULAR

## 2021-08-26 NOTE — Progress Notes (Signed)
Pt here for depo which was given IM right deltoid after negative urine preg test obtained.  Pt tolerated well.  NDC# [id number]

## 2021-09-01 ENCOUNTER — Institutional Professional Consult (permissible substitution): Payer: Self-pay | Admitting: Plastic Surgery

## 2021-09-15 ENCOUNTER — Other Ambulatory Visit: Payer: Self-pay

## 2021-09-15 ENCOUNTER — Ambulatory Visit (INDEPENDENT_AMBULATORY_CARE_PROVIDER_SITE_OTHER): Payer: 59 | Admitting: Plastic Surgery

## 2021-09-15 VITALS — BP 125/82 | HR 88 | Ht 64.5 in | Wt 176.8 lb

## 2021-09-15 DIAGNOSIS — N62 Hypertrophy of breast: Secondary | ICD-10-CM

## 2021-09-15 DIAGNOSIS — M546 Pain in thoracic spine: Secondary | ICD-10-CM

## 2021-09-17 ENCOUNTER — Encounter: Payer: Self-pay | Admitting: Plastic Surgery

## 2021-09-17 NOTE — Progress Notes (Signed)
Referring Provider Donald Prose, MD [address] [place],  [place] [address]   CC:  Breast hypertrophy and back pain  Emily Hernandez is an 26 y.o. female.  HPI: Patient is a 26 year old interested in bilateral breast reduction.  Mammary Hyperplasia: The patient is a 26 y.o. female with a history of mammary hyperplasia for several years.  She has extremely large breasts causing symptoms that include the following: Back pain in the upper and lower back, including neck pain. She pulls or pins her bra straps to provide better lift and relief of the pressure and pain. She notices relief by holding her breast up manually.  Her shoulder straps cause grooves and pain and pressure that requires padding for relief. Pain medication is sometimes required with motrin and tylenol.  Activities that are hindered by enlarged breasts include: exercise and running.  She has tried supportive clothing as well as fitted bras without improvement.  Her breasts are extremely large and fairly symmetric although the left breast is a little larger.  She has  Preoperative bra size = DDD cup.    Mammogram history: None due to age.  Family history of breast cancer: None.  Tobacco use: None.   The patient expresses the desire to pursue surgical intervention.  She has done PT with [facility].  Allergies  Allergen Reactions   Penicillins     Swelling and Hives    Outpatient Encounter Medications as of 09/15/2021  Medication Sig   alclomethasone (ACLOVATE) 0.05 % ointment Apply to lips twice a day until improved.   amphetamine-dextroamphetamine (ADDERALL XR) 15 MG 24 hr capsule Take 1 capsule by mouth 2 (two) times daily.   amphetamine-dextroamphetamine (ADDERALL) 20 MG tablet Take 1 tablet (20 mg total) by mouth 2 (two) times daily.   buPROPion (WELLBUTRIN XL) 150 MG 24 hr tablet TAKE 3 TABLETS BY MOUTH EVERY MORNING   clindamycin (CLEOCIN T) 1 % external solution Apply to affected area groin 1-2 times  a day until improved.   ketoconazole (NIZORAL) 2 % cream Apply to affected areas rash on thighs and back twice daily until improved.   lamoTRIgine (LAMICTAL) 100 MG tablet Take one tablet daily.   lamoTRIgine (LAMICTAL) 200 MG tablet Take 1 tablet (200 mg total) by mouth at bedtime.   lisdexamfetamine (VYVANSE) 30 MG capsule Take 1 capsule (30 mg total) by mouth daily.   medroxyPROGESTERone Acetate 150 MG/ML SUSY INJECT 1 ML (150 MG TOTAL) INTO THE MUSCLE ONCE FOR 1 DOSE.   tacrolimus (PROTOPIC) 0.1 % ointment Apply topically in the morning and at bedtime. Apply to dry areas of lips   topiramate (TOPAMAX) 100 MG tablet Take one tablet daily.   valACYclovir (VALTREX) 1000 MG tablet Take 2 tabs BID x 1 day prn sx   No facility-administered encounter medications on file as of 09/15/2021.     Past Medical History:  Diagnosis Date   Cold sore    Dysplastic nevus 10/15/2020   Left upper back, severe. Exc 02/03/21 2:30pm.   Idiopathic intracranial hypertension    Vaccine for human papilloma virus (HPV) types 6, 11, 16, and 18 administered     Past Surgical History:  Procedure Laterality Date   none      Family History  Problem Relation Age of Onset   GI Bleed Maternal Grandmother    Heart attack Maternal Grandmother    Healthy Brother    Breast cancer Neg Hx    Ovarian cancer Neg Hx  Colon cancer Neg Hx    Diabetes Neg Hx     Social History   Social History Narrative   She is currently a Educational psychologist at [employer] level of education:  Some college   Lives with mother and stepfather.     Review of Systems General: Denies fevers, chills, weight loss CV: Denies chest pain, shortness of breath, palpitations   Physical Exam Vitals with BMI 09/15/2021 09/23/2020 08/02/2020  Height 5' 4.5" 5\' 3"  5\' 3"   Weight 176 lbs 13 oz 156 lbs 13 oz 159 lbs  BMI 29.89 88.32 54.98  Systolic 264 158 309  Diastolic 82 70 60  Pulse 88 - -    General:  No acute distress,  Alert  and oriented, Non-Toxic, Normal speech and affect Breast: Sternal notch to nipple is 32 cm on the right and 34 cm on the left, base width is 18 cm bilaterally, nipple to fold is 13 cm bilaterally.  Patient has significant bilateral breast ptosis.  No masses noted on physical exam.  Assessment/Plan The patient has bilateral symptomatic macromastia.  She is a good candidate for a breast reduction.  She is interested in pursuing surgical treatment.  She has tried supportive garments and fitted bras with no relief.  The details of breast reduction surgery were discussed.  I explained the procedure in detail along the with the expected scars.  The risks were discussed in detail and include bleeding, infection, damage to surrounding structures, need for additional procedures, nipple loss, change in nipple sensation, persistent pain, contour irregularities and asymmetries.  I explained that breast feeding is often not possible after breast reduction surgery.  We discussed the expected postoperative course with an overall recovery period of about 1 month.  She demonstrated full understanding of all risks.  The patient is interested in pursuing surgical treatment.    The estimated excess breast tissue to be removed at the time of surgery = greater than 600 g bilaterally  [name] 09/17/2021, 7:15 PM      intervention.

## 2021-09-22 ENCOUNTER — Ambulatory Visit (INDEPENDENT_AMBULATORY_CARE_PROVIDER_SITE_OTHER): Payer: 59 | Admitting: Adult Health

## 2021-09-22 ENCOUNTER — Encounter: Payer: Self-pay | Admitting: Adult Health

## 2021-09-22 ENCOUNTER — Other Ambulatory Visit: Payer: Self-pay

## 2021-09-22 DIAGNOSIS — G4700 Insomnia, unspecified: Secondary | ICD-10-CM | POA: Diagnosis not present

## 2021-09-22 DIAGNOSIS — F902 Attention-deficit hyperactivity disorder, combined type: Secondary | ICD-10-CM | POA: Diagnosis not present

## 2021-09-22 DIAGNOSIS — F319 Bipolar disorder, unspecified: Secondary | ICD-10-CM | POA: Diagnosis not present

## 2021-09-22 DIAGNOSIS — F331 Major depressive disorder, recurrent, moderate: Secondary | ICD-10-CM

## 2021-09-22 MED ORDER — LAMOTRIGINE 100 MG PO TABS: ORAL_TABLET | ORAL | 1 refills | Status: DC

## 2021-09-22 MED ORDER — LISDEXAMFETAMINE DIMESYLATE 30 MG PO CAPS: 30.0000 mg | ORAL_CAPSULE | Freq: Every day | ORAL | 0 refills | Status: DC

## 2021-09-22 MED ORDER — LAMOTRIGINE 200 MG PO TABS: 200.0000 mg | ORAL_TABLET | Freq: Every day | ORAL | 1 refills | Status: DC

## 2021-09-22 MED ORDER — TOPIRAMATE 100 MG PO TABS: ORAL_TABLET | ORAL | 1 refills | Status: DC

## 2021-09-22 MED ORDER — AMPHETAMINE-DEXTROAMPHETAMINE 20 MG PO TABS: 20.0000 mg | ORAL_TABLET | Freq: Two times a day (BID) | ORAL | 0 refills | Status: DC

## 2021-09-22 MED ORDER — BUPROPION HCL ER (XL) 150 MG PO TB24: ORAL_TABLET | ORAL | 1 refills | Status: DC

## 2021-09-22 NOTE — Progress Notes (Signed)
Emily Hernandez [id number] 09/15/1995 26 y.o.  Subjective:   Patient ID:  Emily Hernandez is a 26 y.o. (DOB 1996/03/12) female.  Chief Complaint: No chief complaint on file.   HPI Emily Hernandez presents to the office today for follow-up of ADHD, MDD, GAD, insomnia, and Bipolar disorder.   Describes mood today as "ok". Pleasant. Mood symptoms - reports some depression, anxiety, and irritability. Moods are consistent. Stating "I'm doing ok". Feels like medications continue to work. Working full time at [employer]. She and boyfriend doing well. Mother doing well. Stable interest and motivation. Taking other medications as prescribed.  Energy levels stable. Active, does not have a regular exercise routine.  Enjoys some usual interests and activities. Dating. Lives with boyfriend. Mother in [place].  Appetite adequate. Weight gain 170 pounds. Sleeps well most nights. Averages 8 or more hours.  Focus and concentration improved. Completing tasks. Managing aspects of household. Works full-time -  at [employer] (3rd shift) Denies SI or HI. Denies AH or VH.  Previous medication trials: Deneen Harts, Lamictal, Topamax, Vraylar  GAD-7    Flowsheet Row Procedure visit from 09/23/2020 in [facility] Office Visit from 08/02/2020 in [facility]  Total GAD-7 Score 15 12      PHQ2-9    Flowsheet Row Procedure visit from 09/23/2020 in [facility] Office Visit from 08/02/2020 in [facility]  PHQ-2 Total Score 4 0  PHQ-9 Total Score 16 9        Review of Systems:  Review of Systems  Musculoskeletal:  Negative for gait problem.  Neurological:  Negative for tremors.  Psychiatric/Behavioral:         Please refer to HPI   Medications: I have reviewed the patient's current medications.  Current Outpatient Medications  Medication Sig Dispense Refill   lisdexamfetamine (VYVANSE) 30 MG capsule Take 1 capsule (30 mg total) by mouth daily. 30  capsule 0   alclomethasone (ACLOVATE) 0.05 % ointment Apply to lips twice a day until improved. 15 g 1   amphetamine-dextroamphetamine (ADDERALL) 20 MG tablet Take 1 tablet (20 mg total) by mouth 2 (two) times daily. 60 tablet 0   buPROPion (WELLBUTRIN XL) 150 MG 24 hr tablet TAKE 3 TABLETS BY MOUTH EVERY MORNING 270 tablet 1   clindamycin (CLEOCIN T) 1 % external solution Apply to affected area groin 1-2 times a day until improved. 60 mL 0   ketoconazole (NIZORAL) 2 % cream Apply to affected areas rash on thighs and back twice daily until improved. 60 g 1   lamoTRIgine (LAMICTAL) 100 MG tablet Take one tablet daily. 90 tablet 1   lamoTRIgine (LAMICTAL) 200 MG tablet Take 1 tablet (200 mg total) by mouth at bedtime. 90 tablet 1   medroxyPROGESTERone Acetate 150 MG/ML SUSY INJECT 1 ML (150 MG TOTAL) INTO THE MUSCLE ONCE FOR 1 DOSE. 1 mL 3   tacrolimus (PROTOPIC) 0.1 % ointment Apply topically in the morning and at bedtime. Apply to dry areas of lips 30 g 1   topiramate (TOPAMAX) 100 MG tablet Take one tablet daily. 90 tablet 1   valACYclovir (VALTREX) 1000 MG tablet Take 2 tabs BID x 1 day prn sx 30 tablet 1   No current facility-administered medications for this visit.    Medication Side Effects: None  Allergies:  Allergies  Allergen Reactions   Penicillins     Swelling and Hives    Past Medical History:  Diagnosis Date   Cold sore  Dysplastic nevus 10/15/2020   Left upper back, severe. Exc 02/03/21 2:30pm.   Idiopathic intracranial hypertension    Vaccine for human papilloma virus (HPV) types 6, 11, 16, and 18 administered     Past Medical History, Surgical history, Social history, and Family history were reviewed and updated as appropriate.   Please see review of systems for further details on the patient's review from today.   Objective:   Physical Exam:  There were no vitals taken for this visit.  Physical Exam Constitutional:      General: She is not in acute  distress. Musculoskeletal:        General: No deformity.  Neurological:     Mental Status: She is alert and oriented to person, place, and time.     Coordination: Coordination normal.  Psychiatric:        Attention and Perception: Attention and perception normal. She does not perceive auditory or visual hallucinations.        Mood and Affect: Mood normal. Mood is not anxious or depressed. Affect is not labile, blunt, angry or inappropriate.        Speech: Speech normal.        Behavior: Behavior normal.        Thought Content: Thought content normal. Thought content is not paranoid or delusional. Thought content does not include homicidal or suicidal ideation. Thought content does not include homicidal or suicidal plan.        Cognition and Memory: Cognition and memory normal.        Judgment: Judgment normal.     Comments: Insight intact    Lab Review:     Component Value Date/Time   NA 138 05/01/2019 1303   K 4.0 05/01/2019 1303   CL 106 05/01/2019 1303   CO2 22 05/01/2019 1303   GLUCOSE 94 05/01/2019 1303   BUN 13 05/01/2019 1303   CREATININE 0.85 05/01/2019 1303   CREATININE 0.75 01/30/2014 1309   CALCIUM 9.2 05/01/2019 1303   PROT 7.5 05/01/2019 1303   ALBUMIN 4.2 05/01/2019 1303   AST 14 (L) 05/01/2019 1303   ALT 15 05/01/2019 1303   ALKPHOS 43 05/01/2019 1303   BILITOT 0.5 05/01/2019 1303   GFRNONAA >60 05/01/2019 1303   GFRAA >60 05/01/2019 1303       Component Value Date/Time   WBC 6.3 05/01/2019 1303   RBC 4.88 05/01/2019 1303   HGB 14.2 05/01/2019 1303   HGB 13.4 03/04/2018 1009   HCT 44.3 05/01/2019 1303   HCT 41.2 03/04/2018 1009   PLT 183 05/01/2019 1303   PLT 233 03/04/2018 1009   MCV 90.8 05/01/2019 1303   MCV 91 03/04/2018 1009   MCH 29.1 05/01/2019 1303   MCHC 32.1 05/01/2019 1303   RDW 11.9 05/01/2019 1303   RDW 12.0 (L) 03/04/2018 1009   LYMPHSABS 1.4 03/04/2018 1009   MONOABS 0.4 09/03/2008 2035   EOSABS 0.0 03/04/2018 1009   BASOSABS 0.0  03/04/2018 1009    No results found for: POCLITH, LITHIUM   No results found for: PHENYTOIN, PHENOBARB, VALPROATE, CBMZ   .res Assessment: Plan:    Plan:  PDMP reviewed  Lamictal 100mg  at hs Lamictal 200mg  daily Topamax 100mg  at hs Wellbutrin XL 450mg  daily for depression  Adderall XR 20mg  BID  RTC 3 months  114/81/76  [facility] ADHD screening completed 40/58 - ADHD likely  Patient advised to contact office with any questions, adverse effects, or acute worsening in signs and symptoms.  Counseled  patient regarding potential benefits, risks, and side effects of Lamictal to include potential risk of Stevens-Johnson syndrome. Advised patient to stop taking Lamictal and contact office immediately if rash develops and to seek urgent medical attention if rash is severe and/or spreading quickly.   Diagnoses and all orders for this visit:  Insomnia, unspecified type  Major depressive disorder, recurrent episode, moderate (HCC) -     buPROPion (WELLBUTRIN XL) 150 MG 24 hr tablet; TAKE 3 TABLETS BY MOUTH EVERY MORNING -     lamoTRIgine (LAMICTAL) 100 MG tablet; Take one tablet daily. -     lamoTRIgine (LAMICTAL) 200 MG tablet; Take 1 tablet (200 mg total) by mouth at bedtime. -     topiramate (TOPAMAX) 100 MG tablet; Take one tablet daily.  Bipolar I disorder (HCC) -     lamoTRIgine (LAMICTAL) 100 MG tablet; Take one tablet daily. -     lamoTRIgine (LAMICTAL) 200 MG tablet; Take 1 tablet (200 mg total) by mouth at bedtime. -     topiramate (TOPAMAX) 100 MG tablet; Take one tablet daily.  Attention deficit hyperactivity disorder (ADHD), combined type -     amphetamine-dextroamphetamine (ADDERALL) 20 MG tablet; Take 1 tablet (20 mg total) by mouth 2 (two) times daily. -     lisdexamfetamine (VYVANSE) 30 MG capsule; Take 1 capsule (30 mg total) by mouth daily.     Please see After Visit Summary for patient specific instructions.  Future Appointments  Date Time Provider  [place]  11/18/2021  2:00 PM [facility] NURSE [facility] None    No orders of the defined types were placed in this encounter.   -------------------------------

## 2021-10-13 ENCOUNTER — Telehealth: Payer: Self-pay | Admitting: Plastic Surgery

## 2021-10-13 NOTE — Telephone Encounter (Signed)
Called patient to follow up on physical therapy. We do not have those records so cannot submit authorization yet. Patient will call [facility] and request them fax records to our office. She will also upload new insurance card to [employer] for authorization.

## 2021-11-08 ENCOUNTER — Other Ambulatory Visit: Payer: Self-pay | Admitting: Obstetrics and Gynecology

## 2021-11-08 DIAGNOSIS — Z3042 Encounter for surveillance of injectable contraceptive: Secondary | ICD-10-CM

## 2021-11-14 ENCOUNTER — Other Ambulatory Visit: Payer: Self-pay | Admitting: Adult Health

## 2021-11-14 ENCOUNTER — Telehealth: Payer: Self-pay | Admitting: Adult Health

## 2021-11-14 DIAGNOSIS — F902 Attention-deficit hyperactivity disorder, combined type: Secondary | ICD-10-CM

## 2021-11-14 MED ORDER — AMPHETAMINE-DEXTROAMPHETAMINE 20 MG PO TABS: 20.0000 mg | ORAL_TABLET | Freq: Two times a day (BID) | ORAL | 0 refills | Status: DC

## 2021-11-14 NOTE — Telephone Encounter (Signed)
Patient called in for refill on Adderall. PH: [phone] Appt 5/1. Pharmacy [facility] [address] ?

## 2021-11-14 NOTE — Telephone Encounter (Signed)
Script sent  

## 2021-11-18 ENCOUNTER — Ambulatory Visit: Payer: 59

## 2021-11-19 ENCOUNTER — Ambulatory Visit: Payer: 59

## 2021-11-20 ENCOUNTER — Ambulatory Visit (INDEPENDENT_AMBULATORY_CARE_PROVIDER_SITE_OTHER): Payer: 59

## 2021-11-20 ENCOUNTER — Other Ambulatory Visit: Payer: Self-pay

## 2021-11-20 DIAGNOSIS — Z3042 Encounter for surveillance of injectable contraceptive: Secondary | ICD-10-CM

## 2021-11-20 MED ORDER — MEDROXYPROGESTERONE ACETATE 150 MG/ML IM SUSP
150.0000 mg | Freq: Once | INTRAMUSCULAR | Status: AC
  Administered 2021-11-20: 150 mg via INTRAMUSCULAR

## 2021-11-20 MED ORDER — MEDROXYPROGESTERONE ACETATE 150 MG/ML IM SUSY: PREFILLED_SYRINGE | INTRAMUSCULAR | 0 refills | Status: DC

## 2021-11-20 NOTE — Progress Notes (Signed)
Pt here for depo which was given IM left deltoid.  Pt made a face during injection but other than that she tolerated it well.  NDC# [phone] ?

## 2021-11-20 NOTE — Telephone Encounter (Signed)
Pt calling for depo refill; has inj appt today at 2pm.  [phone]  Pt aware refill eRx'd and need to schedule annual. ?

## 2021-11-24 ENCOUNTER — Ambulatory Visit: Payer: 59 | Admitting: Licensed Practical Nurse

## 2021-12-15 ENCOUNTER — Ambulatory Visit: Payer: Self-pay | Admitting: Adult Health

## 2021-12-24 ENCOUNTER — Ambulatory Visit (INDEPENDENT_AMBULATORY_CARE_PROVIDER_SITE_OTHER): Payer: 59 | Admitting: Adult Health

## 2021-12-24 ENCOUNTER — Encounter: Payer: Self-pay | Admitting: Adult Health

## 2021-12-24 DIAGNOSIS — F331 Major depressive disorder, recurrent, moderate: Secondary | ICD-10-CM

## 2021-12-24 DIAGNOSIS — F902 Attention-deficit hyperactivity disorder, combined type: Secondary | ICD-10-CM | POA: Diagnosis not present

## 2021-12-24 DIAGNOSIS — F319 Bipolar disorder, unspecified: Secondary | ICD-10-CM

## 2021-12-24 DIAGNOSIS — F411 Generalized anxiety disorder: Secondary | ICD-10-CM

## 2021-12-24 DIAGNOSIS — G4700 Insomnia, unspecified: Secondary | ICD-10-CM

## 2021-12-24 MED ORDER — AMPHETAMINE-DEXTROAMPHETAMINE 20 MG PO TABS: 20.0000 mg | ORAL_TABLET | Freq: Two times a day (BID) | ORAL | 0 refills | Status: DC

## 2021-12-24 NOTE — Progress Notes (Signed)
[name] ?[id number] ?Oct 27, 1995 ?26 y.o. ? ?Subjective:  ? ?Patient ID:  Emily Hernandez is a 26 y.o. (DOB 1996-07-03) female. ? ?Chief Complaint: No chief complaint on file. ? ? ?HPI ?Emily Hernandez presents to the office today for follow-up of ADHD, MDD, GAD, insomnia, and Bipolar disorder.  ? ?Describes mood today as "ok". Pleasant. Mood symptoms - reports some depression, anxiety, and irritability. Moods are consistent. Stating "I'm doing alright". Feels like medications continue to work. She and boyfriend doing well. Mother local and supportive. Breast reduction scheduled for later this month. Stable interest and motivation. Taking other medications as prescribed. ?Energy levels stable. Active, does not have a regular exercise routine. Walking dogs. ?Enjoys some usual interests and activities. Dating. Lives with boyfriend. Mother in [place].  ?Appetite adequate. Weight gain 170 pounds. ?Sleeps well most nights. Averages 8 or more hours.  ?Focus and concentration improved. Completing tasks. Managing aspects of household. Works full-time -  at [employer] (3rd shift) ?Denies SI or HI. ?Denies AH or VH. ? ?Previous medication trials: Latuda, Abilify, Lamictal, Topamax, Vraylar ? ? ? ?GAD-7   ? ?Flowsheet Row Procedure visit from 09/23/2020 in [facility] Office Visit from 08/02/2020 in [facility]  ?Total GAD-7 Score 15 12  ? ?  ? ?PHQ2-9   ? ?Flowsheet Row Procedure visit from 09/23/2020 in [facility] Office Visit from 08/02/2020 in [facility]  ?PHQ-2 Total Score 4 0  ?PHQ-9 Total Score 16 9  ? ?  ?  ? ?Review of Systems:  ?Review of Systems  ?Musculoskeletal:  Negative for gait problem.  ?Neurological:  Negative for tremors.  ?Psychiatric/Behavioral:    ?     Please refer to HPI  ? ?Medications: I have reviewed the patient's current medications. ? ?Current Outpatient Medications  ?Medication Sig Dispense Refill  ? [START ON 01/21/2022]  amphetamine-dextroamphetamine (ADDERALL) 20 MG tablet Take 1 tablet (20 mg total) by mouth 2 (two) times daily. 60 tablet 0  ? [START ON 02/18/2022] amphetamine-dextroamphetamine (ADDERALL) 20 MG tablet Take 1 tablet (20 mg total) by mouth 2 (two) times daily. 60 tablet 0  ? alclomethasone (ACLOVATE) 0.05 % ointment Apply to lips twice a day until improved. 15 g 1  ? amphetamine-dextroamphetamine (ADDERALL) 20 MG tablet Take 1 tablet (20 mg total) by mouth 2 (two) times daily. 60 tablet 0  ? buPROPion (WELLBUTRIN XL) 150 MG 24 hr tablet TAKE 3 TABLETS BY MOUTH EVERY MORNING 270 tablet 1  ? lamoTRIgine (LAMICTAL) 100 MG tablet Take one tablet daily. 90 tablet 1  ? medroxyPROGESTERone Acetate 150 MG/ML SUSY INJECT 1 ML (150 MG TOTAL) INTO THE MUSCLE ONCE FOR 1 DOSE. 1 mL 0  ? topiramate (TOPAMAX) 100 MG tablet Take one tablet daily. 90 tablet 1  ? valACYclovir (VALTREX) 1000 MG tablet Take 2 tabs BID x 1 day prn sx 30 tablet 1  ? ?No current facility-administered medications for this visit.  ? ? ?Medication Side Effects: None ? ?Allergies:  ?Allergies  ?Allergen Reactions  ? Penicillins   ?  Swelling and Hives  ? ? ?Past Medical History:  ?Diagnosis Date  ? Cold sore   ? Dysplastic nevus 10/15/2020  ? Left upper back, severe. Exc 02/03/21 2:30pm.  ? Idiopathic intracranial hypertension   ? Vaccine for human papilloma virus (HPV) types 6, 11, 16, and 18 administered   ? ? ?Past Medical History, Surgical history, Social history, and Family history were reviewed and updated as appropriate.  ? ?  Please see review of systems for further details on the patient's review from today.  ? ?Objective:  ? ?Physical Exam:  ?There were no vitals taken for this visit. ? ?Physical Exam ?Constitutional:   ?   General: She is not in acute distress. ?Musculoskeletal:     ?   General: No deformity.  ?Neurological:  ?   Mental Status: She is alert and oriented to person, place, and time.  ?   Coordination: Coordination normal.  ?Psychiatric:      ?   Attention and Perception: Attention and perception normal. She does not perceive auditory or visual hallucinations.     ?   Mood and Affect: Mood normal. Mood is not anxious or depressed. Affect is not labile, blunt, angry or inappropriate.     ?   Speech: Speech normal.     ?   Behavior: Behavior normal.     ?   Thought Content: Thought content normal. Thought content is not paranoid or delusional. Thought content does not include homicidal or suicidal ideation. Thought content does not include homicidal or suicidal plan.     ?   Cognition and Memory: Cognition and memory normal.     ?   Judgment: Judgment normal.  ?   Comments: Insight intact  ? ? ?Lab Review:  ?   ?Component Value Date/Time  ? NA 138 05/01/2019 1303  ? K 4.0 05/01/2019 1303  ? CL 106 05/01/2019 1303  ? CO2 22 05/01/2019 1303  ? GLUCOSE 94 05/01/2019 1303  ? BUN 13 05/01/2019 1303  ? CREATININE 0.85 05/01/2019 1303  ? CREATININE 0.75 01/30/2014 1309  ? CALCIUM 9.2 05/01/2019 1303  ? PROT 7.5 05/01/2019 1303  ? ALBUMIN 4.2 05/01/2019 1303  ? AST 14 (L) 05/01/2019 1303  ? ALT 15 05/01/2019 1303  ? ALKPHOS 43 05/01/2019 1303  ? BILITOT 0.5 05/01/2019 1303  ? GFRNONAA >60 05/01/2019 1303  ? GFRAA >60 05/01/2019 1303  ? ? ?   ?Component Value Date/Time  ? WBC 6.3 05/01/2019 1303  ? RBC 4.88 05/01/2019 1303  ? HGB 14.2 05/01/2019 1303  ? HGB 13.4 03/04/2018 1009  ? HCT 44.3 05/01/2019 1303  ? HCT 41.2 03/04/2018 1009  ? PLT 183 05/01/2019 1303  ? PLT 233 03/04/2018 1009  ? MCV 90.8 05/01/2019 1303  ? MCV 91 03/04/2018 1009  ? MCH 29.1 05/01/2019 1303  ? MCHC 32.1 05/01/2019 1303  ? RDW 11.9 05/01/2019 1303  ? RDW 12.0 (L) 03/04/2018 1009  ? LYMPHSABS 1.4 03/04/2018 1009  ? MONOABS 0.4 09/03/2008 2035  ? EOSABS 0.0 03/04/2018 1009  ? BASOSABS 0.0 03/04/2018 1009  ? ? ?No results found for: POCLITH, LITHIUM  ? ?No results found for: PHENYTOIN, PHENOBARB, VALPROATE, CBMZ  ? ?.res ?Assessment: Plan:   ? ?Plan: ? ?PDMP reviewed ? ?Lamictal '100mg'$  at  hs ?Topamax '100mg'$  at hs ?Wellbutrin XL '450mg'$  daily for depression  ?Adderall XR '20mg'$  BID ? ?D/C Lamictal '200mg'$  daily ? ?RTC 3 months ? ?114/81/76 ? ?[facility] ADHD screening completed 40/58 - ADHD likely ? ?Patient advised to contact office with any questions, adverse effects, or acute worsening in signs and symptoms. ? ?Counseled patient regarding potential benefits, risks, and side effects of Lamictal to include potential risk of Stevens-Johnson syndrome. Advised patient to stop taking Lamictal and contact office immediately if rash develops and to seek urgent medical attention if rash is severe and/or spreading quickly. ?Diagnoses and all orders for this visit: ? ?Major depressive  disorder, recurrent episode, moderate ([place]) ? ?Attention deficit hyperactivity disorder (ADHD), combined type ?-     amphetamine-dextroamphetamine (ADDERALL) 20 MG tablet; Take 1 tablet (20 mg total) by mouth 2 (two) times daily. ?-     amphetamine-dextroamphetamine (ADDERALL) 20 MG tablet; Take 1 tablet (20 mg total) by mouth 2 (two) times daily. ?-     amphetamine-dextroamphetamine (ADDERALL) 20 MG tablet; Take 1 tablet (20 mg total) by mouth 2 (two) times daily. ? ?Bipolar I disorder ([place]) ? ?Generalized anxiety disorder ? ?Insomnia, unspecified type ? ?  ? ?Please see After Visit Summary for patient specific instructions. ? ?Future Appointments  ?Date Time Provider [place]  ?12/24/2021  2:40 PM Maryann Mccall, Berdie Ogren, NP CP[facility]CP None  ?12/29/2021  9:00 AM Corena Herter, PA-C PSS[facility]PSS None  ?01/05/2022 10:00 AM [facility] PAT 1 [facility] None  ?01/19/2022  9:00 AM [name], MD PSS-PSS None  ?02/02/2022  9:00 AM Corena Herter, PA-C PSS-PSS None  ?02/11/2022  1:35 PM Dominic, Nunzio Cobbs, CNM WS-WS None  ? ? ?No orders of the defined types were placed in this encounter. ? ? ?------------------------------- ?

## 2021-12-25 ENCOUNTER — Ambulatory Visit: Payer: 59 | Admitting: Licensed Practical Nurse

## 2021-12-29 ENCOUNTER — Ambulatory Visit (INDEPENDENT_AMBULATORY_CARE_PROVIDER_SITE_OTHER): Payer: Managed Care, Other (non HMO) | Admitting: Physician Assistant

## 2021-12-29 ENCOUNTER — Encounter: Payer: Self-pay | Admitting: Physician Assistant

## 2021-12-29 VITALS — BP 119/78 | HR 77 | Ht 64.0 in | Wt 171.4 lb

## 2021-12-29 DIAGNOSIS — N62 Hypertrophy of breast: Secondary | ICD-10-CM

## 2021-12-29 MED ORDER — OXYCODONE HCL 5 MG PO TABS: 5.0000 mg | ORAL_TABLET | Freq: Four times a day (QID) | ORAL | 0 refills | Status: AC | PRN

## 2021-12-29 MED ORDER — ONDANSETRON 4 MG PO TBDP: 4.0000 mg | ORAL_TABLET | Freq: Three times a day (TID) | ORAL | 0 refills | Status: DC | PRN

## 2021-12-29 NOTE — H&P (View-Only) (Signed)
Patient ID: Emily Hernandez, female    DOB: 11-Aug-1996, 26 y.o.   MRN: [id number]  Chief Complaint  Patient presents with   Pre-op Exam      ICD-10-CM   1. Breast hypertrophy  N62        History of Present Illness: Emily Hernandez is a 26 y.o.  female  with a history of macromastia.  She presents for preoperative evaluation for upcoming procedure, bilateral breast reduction, scheduled for 01/13/2022 with Dr.  Erin Hearing .  The patient has never had previous surgery or anesthesia.  She takes Topamax for IIH.  She also will occasionally have to take Excedrin for her headaches.  Advised her to avoid Excedrin or aspirin containing products for 1 week prior to surgery.  She will also hold any NSAIDs 72 hours before surgery.  She will hold her multivitamins 5 days prior to surgery.  She will hold her Adderall day of surgery.  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  She gets the Depo shot every 3 months for birth control.  She has been completely nicotine free since 03/2021.  No personal or family history of blood clots or clotting disorder.  Summary of Previous Visit: Patient was seen for initial consult 09/15/2021 by Dr. Erin Hearing.  At that time, complained of chronic back and neck discomfort in the context of large breasts.  Preoperative bra size equals DDD cup.  She had already completed physical therapy without any significant relief.  STN 32 cm on the right, 34 cm on the left.  Base width 18 cm bilaterally.  Estimated excess breast tissue removed at time of surgery equals 600 g each side.  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  No FMLA, but will take 2 weeks off of work.  PMH Significant for: Macromastia, IIH, ADHD, anxiety.   Past Medical History: Allergies: Allergies  Allergen Reactions   Penicillins     Swelling and Hives    Current Medications:  Current Outpatient Medications:    alclomethasone (ACLOVATE) 0.05 % ointment, Apply to  lips twice a day until improved., Disp: 15 g, Rfl: 1   amphetamine-dextroamphetamine (ADDERALL) 20 MG tablet, Take 1 tablet (20 mg total) by mouth 2 (two) times daily., Disp: 60 tablet, Rfl: 0   [START ON 01/21/2022] amphetamine-dextroamphetamine (ADDERALL) 20 MG tablet, Take 1 tablet (20 mg total) by mouth 2 (two) times daily., Disp: 60 tablet, Rfl: 0   [START ON 02/18/2022] amphetamine-dextroamphetamine (ADDERALL) 20 MG tablet, Take 1 tablet (20 mg total) by mouth 2 (two) times daily., Disp: 60 tablet, Rfl: 0   buPROPion (WELLBUTRIN XL) 150 MG 24 hr tablet, TAKE 3 TABLETS BY MOUTH EVERY MORNING, Disp: 270 tablet, Rfl: 1   lamoTRIgine (LAMICTAL) 100 MG tablet, Take one tablet daily., Disp: 90 tablet, Rfl: 1   medroxyPROGESTERone Acetate 150 MG/ML SUSY, INJECT 1 ML (150 MG TOTAL) INTO THE MUSCLE ONCE FOR 1 DOSE., Disp: 1 mL, Rfl: 0   topiramate (TOPAMAX) 100 MG tablet, Take one tablet daily., Disp: 90 tablet, Rfl: 1   valACYclovir (VALTREX) 1000 MG tablet, Take 2 tabs BID x 1 day prn sx, Disp: 30 tablet, Rfl: 1  Past Medical Problems: Past Medical History:  Diagnosis Date   Cold sore    Dysplastic nevus 10/15/2020   Left upper back, severe. Exc 02/03/21 2:30pm.   Idiopathic intracranial hypertension    Vaccine for human papilloma virus (HPV) types  6, 11, 16, and 18 administered     Past Surgical History: Past Surgical History:  Procedure Laterality Date   none      Social History: Social History   Socioeconomic History   Marital status: Single    Spouse name: Not on file   Number of children: 0   Years of education: Not on file   Highest education level: Not on file  Occupational History   Occupation: server  Tobacco Use   Smoking status: Every Day    Packs/day: 0.25    Years: 2.00    Pack years: 0.50    Types: Cigarettes    Last attempt to quit: 02/21/2018    Years since quitting: 3.8   Smokeless tobacco: Current    Last attempt to quit: 08/17/2012  Vaping Use   Vaping Use:  Every day  Substance and Sexual Activity   Alcohol use: Yes   Drug use: No   Sexual activity: Yes    Birth control/protection: Injection  Other Topics Concern   Not on file  Social History Narrative   She is currently a Educational psychologist at [employer] level of education:  Some college   Lives with mother and stepfather.   Social Determinants of Health   Financial Resource Strain: Not on file  Food Insecurity: Not on file  Transportation Needs: Not on file  Physical Activity: Not on file  Stress: Not on file  Social Connections: Not on file  Intimate Partner Violence: Not on file    Family History: Family History  Problem Relation Age of Onset   GI Bleed Maternal Grandmother    Heart attack Maternal Grandmother    Healthy Brother    Breast cancer Neg Hx    Ovarian cancer Neg Hx    Colon cancer Neg Hx    Diabetes Neg Hx     Review of Systems: ROS Recent sinus infection treated with antibiotics 2 weeks ago, completely resolved. Denies any recent fevers, chest pain, difficulty breathing, or leg swelling.  Physical Exam: Vital Signs There were no vitals taken for this visit.  Physical Exam Constitutional:      General: Not in acute distress.    Appearance: Normal appearance. Not ill-appearing.  HENT:     Head: Normocephalic and atraumatic.  Eyes:     Pupils: Pupils are equal, round. Cardiovascular:     Rate and Rhythm: Normal rate.    Pulses: Normal pulses.  Pulmonary:     Effort: No respiratory distress or increased work of breathing.  Speaks in full sentences. Abdominal:     General: Abdomen is flat. No distension.   Musculoskeletal: Normal range of motion. No lower extremity swelling or edema. No varicosities. Skin:    General: Skin is warm and dry.     Findings: No erythema or rash.  Neurological:     Mental Status: Alert and oriented to person, place, and time.  Psychiatric:        Mood and Affect: Mood normal.        Behavior: Behavior  normal.    Assessment/Plan: The patient is scheduled for breast reduction with Dr. Erin Hearing.  Risks, benefits, and alternatives of procedure discussed, questions answered and consent obtained.    Smoking Status: Previous smoker; Counseling Given?  Understands risks of poor wound healing and other postsurgical complications should she resume any nicotine-containing products. Last Mammogram: N/A due to age  Caprini Score: 3; Risk Factors include: BMI greater than 25 and length of  planned surgery.  Will not factor in the Depo shot.  Recommendation for mechanical prophylaxis. Encourage early ambulation.   Pictures obtained: 09/15/2021  Post-op Rx sent to pharmacy: Oxycodone, Zofran.  Patient was provided with the General Surgical Risk consent document and Pain Medication Agreement prior to their appointment.  They had adequate time to read through the risk consent documents and Pain Medication Agreement. We also discussed them in person together during this preop appointment. All of their questions were answered to their satisfaction.  Recommended calling if they have any further questions.  Risk consent form and Pain Medication Agreement to be scanned into patient's chart.  The risk that can be encountered with breast reduction were discussed and include the following but not limited to these:  Breast asymmetry, fluid accumulation, firmness of the breast, inability to breast feed, loss of nipple or areola, skin loss, decrease or no nipple sensation, fat necrosis of the breast tissue, bleeding, infection, healing delay.  There are risks of anesthesia, changes to skin sensation and injury to nerves or blood vessels.  The muscle can be temporarily or permanently injured.  You may have an allergic reaction to tape, suture, glue, blood products which can result in skin discoloration, swelling, pain, skin lesions, poor healing.  Any of these can lead to the need for revisonal surgery or stage procedures.  A  reduction has potential to interfere with diagnostic procedures.  Nipple or breast piercing can increase risks of infection.  This procedure is best done when the breast is fully developed.  Changes in the breast will continue to occur over time.  Pregnancy can alter the outcomes of previous breast reduction surgery, weight gain and weigh loss can also effect the long term appearance.     Electronically signed by: Krista Blue, PA-C 12/29/2021 7:26 AM

## 2021-12-29 NOTE — Progress Notes (Signed)
? ?  Patient ID: Emily Hernandez, female    DOB: [birth year], 26 y.o.   MRN: [id number] ? ?Chief Complaint  ?Patient presents with  ? Pre-op Exam  ? ? ?  ICD-10-CM   ?1. Breast hypertrophy  N62   ?  ? ? ? ?History of Present Illness: ?Emily Hernandez is a 26 y.o.  female  with a history of macromastia.  She presents for preoperative evaluation for upcoming procedure, bilateral breast reduction, scheduled for 01/13/2022 with Dr.  Erin Hearing . ? ?The patient has never had previous surgery or anesthesia.  She takes Topamax for IIH.  She also will occasionally have to take Excedrin for her headaches.  Advised her to avoid Excedrin or aspirin containing products for 1 week prior to surgery.  She will also hold any NSAIDs 72 hours before surgery.  She will hold her multivitamins 5 days prior to surgery.  She will hold her Adderall day of surgery.  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  She gets the Depo shot every 3 months for birth control.  She has been completely nicotine free since 03/2021.  No personal or family history of blood clots or clotting disorder. ? ?Summary of Previous Visit: Patient was seen for initial consult 09/15/2021 by Dr. Erin Hearing.  At that time, complained of chronic back and neck discomfort in the context of large breasts.  Preoperative bra size equals DDD cup.  She had already completed physical therapy without any significant relief.  STN 32 cm on the right, 34 cm on the left.  Base width 18 cm bilaterally.  Estimated excess breast tissue removed at time of surgery equals 600 g each side. ? 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  No FMLA, but will take 2 weeks off of work. ? ?PMH Significant for: Macromastia, IIH, ADHD, anxiety. ? ? ?Past Medical History: ?Allergies: ?Allergies  ?Allergen Reactions  ? Penicillins   ?  Swelling and Hives  ? ? ?Current Medications: ? ?Current Outpatient Medications:  ?  alclomethasone (ACLOVATE) 0.05 % ointment, Apply to  lips twice a day until improved., Disp: 15 g, Rfl: 1 ?  amphetamine-dextroamphetamine (ADDERALL) 20 MG tablet, Take 1 tablet (20 mg total) by mouth 2 (two) times daily., Disp: 60 tablet, Rfl: 0 ?  [START ON 01/21/2022] amphetamine-dextroamphetamine (ADDERALL) 20 MG tablet, Take 1 tablet (20 mg total) by mouth 2 (two) times daily., Disp: 60 tablet, Rfl: 0 ?  [START ON 02/18/2022] amphetamine-dextroamphetamine (ADDERALL) 20 MG tablet, Take 1 tablet (20 mg total) by mouth 2 (two) times daily., Disp: 60 tablet, Rfl: 0 ?  buPROPion (WELLBUTRIN XL) 150 MG 24 hr tablet, TAKE 3 TABLETS BY MOUTH EVERY MORNING, Disp: 270 tablet, Rfl: 1 ?  lamoTRIgine (LAMICTAL) 100 MG tablet, Take one tablet daily., Disp: 90 tablet, Rfl: 1 ?  medroxyPROGESTERone Acetate 150 MG/ML SUSY, INJECT 1 ML (150 MG TOTAL) INTO THE MUSCLE ONCE FOR 1 DOSE., Disp: 1 mL, Rfl: 0 ?  topiramate (TOPAMAX) 100 MG tablet, Take one tablet daily., Disp: 90 tablet, Rfl: 1 ?  valACYclovir (VALTREX) 1000 MG tablet, Take 2 tabs BID x 1 day prn sx, Disp: 30 tablet, Rfl: 1 ? ?Past Medical Problems: ?Past Medical History:  ?Diagnosis Date  ? Cold sore   ? Dysplastic nevus 10/15/2020  ? Left upper back, severe. Exc 02/03/21 2:30pm.  ? Idiopathic intracranial hypertension   ? Vaccine for human papilloma virus (HPV) types  6, 11, 16, and 18 administered   ? ? ?Past Surgical History: ?Past Surgical History:  ?Procedure Laterality Date  ? none    ? ? ?Social History: ?Social History  ? ?Socioeconomic History  ? Marital status: Single  ?  Spouse name: Not on file  ? Number of children: 0  ? Years of education: Not on file  ? Highest education level: Not on file  ?Occupational History  ? Occupation: server  ?Tobacco Use  ? Smoking status: Every Day  ?  Packs/day: 0.25  ?  Years: 2.00  ?  Pack years: 0.50  ?  Types: Cigarettes  ?  Last attempt to quit: 02/21/2018  ?  Years since quitting: 3.8  ? Smokeless tobacco: Current  ?  Last attempt to quit: 08/17/2012  ?Vaping Use  ? Vaping Use:  Every day  ?Substance and Sexual Activity  ? Alcohol use: Yes  ? Drug use: No  ? Sexual activity: Yes  ?  Birth control/protection: Injection  ?Other Topics Concern  ? Not on file  ?Social History Narrative  ? She is currently a Educational psychologist at [employer]  ? Highest level of education:  Some college  ? Lives with mother and stepfather.  ? ?Social Determinants of Health  ? ?Financial Resource Strain: Not on file  ?Food Insecurity: Not on file  ?Transportation Needs: Not on file  ?Physical Activity: Not on file  ?Stress: Not on file  ?Social Connections: Not on file  ?Intimate Partner Violence: Not on file  ? ? ?Family History: ?Family History  ?Problem Relation Age of Onset  ? GI Bleed Maternal Grandmother   ? Heart attack Maternal Grandmother   ? Healthy Brother   ? Breast cancer Neg Hx   ? Ovarian cancer Neg Hx   ? Colon cancer Neg Hx   ? Diabetes Neg Hx   ? ? ?Review of Systems: ?ROS ?Recent sinus infection treated with antibiotics 2 weeks ago, completely resolved. ?Denies any recent fevers, chest pain, difficulty breathing, or leg swelling. ? ?Physical Exam: ?Vital Signs ?There were no vitals taken for this visit. ? ?Physical Exam ?Constitutional:   ?   General: Not in acute distress. ?   Appearance: Normal appearance. Not ill-appearing.  ?HENT:  ?   Head: Normocephalic and atraumatic.  ?Eyes:  ?   Pupils: Pupils are equal, round. ?Cardiovascular:  ?   Rate and Rhythm: Normal rate. ?   Pulses: Normal pulses.  ?Pulmonary:  ?   Effort: No respiratory distress or increased work of breathing.  Speaks in full sentences. ?Abdominal:  ?   General: Abdomen is flat. No distension.   ?Musculoskeletal: Normal range of motion. No lower extremity swelling or edema. No varicosities. ?Skin: ?   General: Skin is warm and dry.  ?   Findings: No erythema or rash.  ?Neurological:  ?   Mental Status: Alert and oriented to person, place, and time.  ?Psychiatric:     ?   Mood and Affect: Mood normal.     ?   Behavior: Behavior  normal.  ? ? ?Assessment/Plan: ?The patient is scheduled for breast reduction with Dr. Erin Hearing.  Risks, benefits, and alternatives of procedure discussed, questions answered and consent obtained.   ? ?Smoking Status: Previous smoker; Counseling Given?  Understands risks of poor wound healing and other postsurgical complications should she resume any nicotine-containing products. ?Last Mammogram: N/A due to age ? ?Caprini Score: 3; Risk Factors include: BMI greater than 25 and length of  planned surgery.  Will not factor in the Depo shot.  Recommendation for mechanical prophylaxis. Encourage early ambulation.  ? ?Pictures obtained: 09/15/2021 ? ?Post-op Rx sent to pharmacy: Oxycodone, Zofran. ? ?Patient was provided with the General Surgical Risk consent document and Pain Medication Agreement prior to their appointment.  They had adequate time to read through the risk consent documents and Pain Medication Agreement. We also discussed them in person together during this preop appointment. All of their questions were answered to their satisfaction.  Recommended calling if they have any further questions.  Risk consent form and Pain Medication Agreement to be scanned into patient's chart. ? ?The risk that can be encountered with breast reduction were discussed and include the following but not limited to these:  Breast asymmetry, fluid accumulation, firmness of the breast, inability to breast feed, loss of nipple or areola, skin loss, decrease or no nipple sensation, fat necrosis of the breast tissue, bleeding, infection, healing delay.  There are risks of anesthesia, changes to skin sensation and injury to nerves or blood vessels.  The muscle can be temporarily or permanently injured.  You may have an allergic reaction to tape, suture, glue, blood products which can result in skin discoloration, swelling, pain, skin lesions, poor healing.  Any of these can lead to the need for revisonal surgery or stage procedures.  A  reduction has potential to interfere with diagnostic procedures.  Nipple or breast piercing can increase risks of infection.  This procedure is best done when the breast is fully developed.  Changes in the breas

## 2022-01-02 NOTE — Progress Notes (Signed)
Surgical Instructions    Your procedure is scheduled on 01/13/22.  Report to [facility] Main Entrance "A" at 6:00 A.M., then check in with the Admitting office.  Call this number if you have problems the morning of surgery:  [phone]   If you have any questions prior to your surgery date call [phone]: Open Monday-Friday 8am-4pm    Remember:  Do not eat after midnight the night before your surgery  You may drink clear liquids until 5:00am the morning of your surgery.   Clear liquids allowed are: Water, Non-Citrus Juices (without pulp), Carbonated Beverages, Clear Tea, Black Coffee ONLY (NO MILK, CREAM OR POWDERED CREAMER of any kind), and Gatorade    Take these medicines the morning of surgery with A SIP OF WATER:  buPROPion (WELLBUTRIN XL) lamoTRIgine (LAMICTAL)  topiramate (TOPAMAX)   IF NEEDED: ondansetron (ZOFRAN-ODT) oxyCODONE (ROXICODONE) valACYclovir (VALTREX)   As of today, STOP taking any Aspirin (unless otherwise instructed by your surgeon) Aleve, Naproxen, Ibuprofen, Motrin, Advil, Goody's, BC's, all herbal medications, fish oil, and all vitamins.           Do not wear jewelry or makeup Do not wear lotions, powders, perfumes/colognes, or deodorant. Do not shave 48 hours prior to surgery.   Do not bring valuables to the hospital. Do not wear nail polish, gel polish, artificial nails, or any other type of covering on natural nails (fingers and toes) If you have artificial nails or gel coating that need to be removed by a nail salon, please have this removed prior to surgery. Artificial nails or gel coating may interfere with anesthesia's ability to adequately monitor your vital signs.  [facility] is not responsible for any belongings or valuables. .   Do NOT Smoke (Tobacco/Vaping)  24 hours prior to your procedure  If you use a CPAP at night, you may bring your mask for your overnight stay.   Contacts, glasses, hearing aids, dentures or partials may not be  worn into surgery, please bring cases for these belongings   For patients admitted to the hospital, discharge time will be determined by your treatment team.   Patients discharged the day of surgery will not be allowed to drive home, and someone needs to stay with them for 24 hours.   SURGICAL WAITING ROOM VISITATION Patients having surgery or a procedure in a hospital may have two support people. Children under the age of 76 must have an adult with them who is not the patient. They may stay in the waiting area during the procedure and may switch out with other visitors. If the patient needs to stay at the hospital during part of their recovery, the visitor guidelines for inpatient rooms apply.  Please refer to the [facility] website for the visitor guidelines for Inpatients (after your surgery is over and you are in a regular room).       Special instructions:    Oral Hygiene is also important to reduce your risk of infection.  Remember - BRUSH YOUR TEETH THE MORNING OF SURGERY WITH YOUR REGULAR TOOTHPASTE   [facility]- Preparing For Surgery  Before surgery, you can play an important role. Because skin is not sterile, your skin needs to be as free of germs as possible. You can reduce the number of germs on your skin by washing with CHG (chlorahexidine gluconate) Soap before surgery.  CHG is an antiseptic cleaner which kills germs and bonds with the skin to continue killing germs even after washing.  Please do not use if you have an allergy to CHG or antibacterial soaps. If your skin becomes reddened/irritated stop using the CHG.  Do not shave (including legs and underarms) for at least 48 hours prior to first CHG shower. It is OK to shave your face.  Please follow these instructions carefully.     Shower the NIGHT BEFORE SURGERY and the MORNING OF SURGERY with CHG Soap.   If you chose to wash your hair, wash your hair first as usual with your normal shampoo. After you shampoo,  rinse your hair and body thoroughly to remove the shampoo.  Then [employer] and genitals (private parts) with your normal soap and rinse thoroughly to remove soap.  After that Use CHG Soap as you would any other liquid soap. You can apply CHG directly to the skin and wash gently with a scrungie or a clean washcloth.   Apply the CHG Soap to your body ONLY FROM THE NECK DOWN.  Do not use on open wounds or open sores. Avoid contact with your eyes, ears, mouth and genitals (private parts). Wash Face and genitals (private parts)  with your normal soap.   Wash thoroughly, paying special attention to the area where your surgery will be performed.  Thoroughly rinse your body with warm water from the neck down.  DO NOT shower/wash with your normal soap after using and rinsing off the CHG Soap.  Pat yourself dry with a CLEAN TOWEL.  Wear CLEAN PAJAMAS to bed the night before surgery  Place CLEAN SHEETS on your bed the night before your surgery  DO NOT SLEEP WITH PETS.   Day of Surgery: Take a shower with CHG soap. Wear Clean/Comfortable clothing the morning of surgery Do not apply any deodorants/lotions.   Remember to brush your teeth WITH YOUR REGULAR TOOTHPASTE.    If you received a COVID test during your pre-op visit, it is requested that you wear a mask when out in public, stay away from anyone that may not be feeling well, and notify your surgeon if you develop symptoms. If you have been in contact with anyone that has tested positive in the last 10 days, please notify your surgeon.    Please read over the following fact sheets that you were given.

## 2022-01-05 ENCOUNTER — Other Ambulatory Visit: Payer: Self-pay

## 2022-01-05 ENCOUNTER — Encounter (HOSPITAL_COMMUNITY)
Admission: RE | Admit: 2022-01-05 | Discharge: 2022-01-05 | Disposition: A | Discharge status: Home / Self Care | Payer: Managed Care, Other (non HMO) | Source: Ambulatory Visit | Attending: Plastic Surgery | Admitting: Plastic Surgery

## 2022-01-05 ENCOUNTER — Encounter (HOSPITAL_COMMUNITY): Payer: Self-pay

## 2022-01-05 VITALS — BP 118/79 | HR 86 | Temp 98.6°F | Resp 17 | Ht 64.0 in | Wt 170.9 lb

## 2022-01-05 DIAGNOSIS — Z01812 Encounter for preprocedural laboratory examination: Secondary | ICD-10-CM | POA: Diagnosis present

## 2022-01-05 DIAGNOSIS — Z01818 Encounter for other preprocedural examination: Secondary | ICD-10-CM

## 2022-01-05 HISTORY — DX: Depression, unspecified: F32.A

## 2022-01-05 HISTORY — DX: Bipolar disorder, unspecified: F31.9

## 2022-01-05 LAB — CBC
HCT: 40.8 % (ref 36.0–46.0)
Hemoglobin: 13.5 g/dL (ref 12.0–15.0)
MCH: 30.5 pg (ref 26.0–34.0)
MCHC: 33.1 g/dL (ref 30.0–36.0)
MCV: 92.1 fL (ref 80.0–100.0)
Platelets: 236 10*3/uL (ref 150–400)
RBC: 4.43 MIL/uL (ref 3.87–5.11)
RDW: 12.9 % (ref 11.5–15.5)
WBC: 5.6 10*3/uL (ref 4.0–10.5)
nRBC: 0 % (ref 0.0–0.2)

## 2022-01-05 NOTE — Progress Notes (Signed)
PCP - Pt could not remember the name of MD but is being seen at [facility] - denies  PPM/ICD - n/a  Chest x-ray - n/a EKG - n/a Stress Test - denies ECHO - denies Cardiac Cath - denies  Sleep Study - denies CPAP - denies  Blood Thinner Instructions: n/a Aspirin Instructions: n/a  ERAS Protcol -Clear liquids until 0500 DOS per anesthesia protocol PRE-SURGERY Ensure or G2- none ordered  COVID TEST- n/a   Anesthesia review: No   Patient denies shortness of breath, fever, cough and chest pain at PAT appointment   All instructions explained to the patient, with a verbal understanding of the material. Patient agrees to go over the instructions while at home for a better understanding. Patient also instructed to self quarantine after being tested for COVID-19. The opportunity to ask questions was provided.

## 2022-01-13 ENCOUNTER — Encounter (HOSPITAL_COMMUNITY): Payer: Self-pay | Admitting: Plastic Surgery

## 2022-01-13 ENCOUNTER — Other Ambulatory Visit: Payer: Self-pay

## 2022-01-13 ENCOUNTER — Encounter (HOSPITAL_COMMUNITY)
Admission: RE | Disposition: A | Discharge status: Home / Self Care | Payer: Self-pay | Source: Home / Self Care | Attending: Plastic Surgery

## 2022-01-13 ENCOUNTER — Ambulatory Visit (HOSPITAL_COMMUNITY): Payer: Managed Care, Other (non HMO) | Admitting: Anesthesiology

## 2022-01-13 ENCOUNTER — Ambulatory Visit (HOSPITAL_BASED_OUTPATIENT_CLINIC_OR_DEPARTMENT_OTHER): Payer: Managed Care, Other (non HMO) | Admitting: Anesthesiology

## 2022-01-13 ENCOUNTER — Ambulatory Visit (HOSPITAL_COMMUNITY)
Admission: RE | Admit: 2022-01-13 | Discharge: 2022-01-13 | Disposition: A | Discharge status: Home / Self Care | Payer: Managed Care, Other (non HMO) | Attending: Plastic Surgery | Admitting: Plastic Surgery

## 2022-01-13 DIAGNOSIS — F419 Anxiety disorder, unspecified: Secondary | ICD-10-CM | POA: Insufficient documentation

## 2022-01-13 DIAGNOSIS — N62 Hypertrophy of breast: Secondary | ICD-10-CM | POA: Diagnosis not present

## 2022-01-13 DIAGNOSIS — Z7982 Long term (current) use of aspirin: Secondary | ICD-10-CM | POA: Insufficient documentation

## 2022-01-13 DIAGNOSIS — F909 Attention-deficit hyperactivity disorder, unspecified type: Secondary | ICD-10-CM | POA: Insufficient documentation

## 2022-01-13 DIAGNOSIS — Z791 Long term (current) use of non-steroidal anti-inflammatories (NSAID): Secondary | ICD-10-CM | POA: Diagnosis not present

## 2022-01-13 DIAGNOSIS — N6042 Mammary duct ectasia of left breast: Secondary | ICD-10-CM | POA: Insufficient documentation

## 2022-01-13 DIAGNOSIS — R519 Headache, unspecified: Secondary | ICD-10-CM | POA: Diagnosis not present

## 2022-01-13 DIAGNOSIS — Z01818 Encounter for other preprocedural examination: Secondary | ICD-10-CM

## 2022-01-13 HISTORY — PX: BREAST REDUCTION SURGERY: SHX8

## 2022-01-13 LAB — POCT PREGNANCY, URINE: Preg Test, Ur: NEGATIVE

## 2022-01-13 SURGERY — MAMMOPLASTY, REDUCTION
Anesthesia: General | Site: Breast | Laterality: Bilateral

## 2022-01-13 MED ORDER — PROPOFOL 10 MG/ML IV BOLUS
INTRAVENOUS | Status: AC
  Filled 2022-01-13: qty 20

## 2022-01-13 MED ORDER — ACETAMINOPHEN 10 MG/ML IV SOLN: 1000.0000 mg | Freq: Once | INTRAVENOUS | Status: DC | PRN

## 2022-01-13 MED ORDER — ORAL CARE MOUTH RINSE: 15.0000 mL | Freq: Once | OROMUCOSAL | Status: AC

## 2022-01-13 MED ORDER — ACETAMINOPHEN 160 MG/5ML PO SOLN: 1000.0000 mg | Freq: Once | ORAL | Status: DC | PRN

## 2022-01-13 MED ORDER — PROPOFOL 1000 MG/100ML IV EMUL
INTRAVENOUS | Status: AC
  Filled 2022-01-13: qty 100

## 2022-01-13 MED ORDER — MIDAZOLAM HCL 2 MG/2ML IJ SOLN
INTRAMUSCULAR | Status: DC | PRN
  Administered 2022-01-13: 2 mg via INTRAVENOUS

## 2022-01-13 MED ORDER — BUPIVACAINE HCL (PF) 0.25 % IJ SOLN
INTRAMUSCULAR | Status: AC
  Filled 2022-01-13: qty 30

## 2022-01-13 MED ORDER — FENTANYL CITRATE (PF) 100 MCG/2ML IJ SOLN
25.0000 ug | INTRAMUSCULAR | Status: DC | PRN
  Administered 2022-01-13 (×2): 50 ug via INTRAVENOUS

## 2022-01-13 MED ORDER — LIDOCAINE HCL (CARDIAC) PF 100 MG/5ML IV SOSY
PREFILLED_SYRINGE | INTRAVENOUS | Status: DC | PRN
  Administered 2022-01-13: 60 mg via INTRATRACHEAL

## 2022-01-13 MED ORDER — DEXAMETHASONE SODIUM PHOSPHATE 10 MG/ML IJ SOLN
INTRAMUSCULAR | Status: DC | PRN
  Administered 2022-01-13: 4 mg via INTRAVENOUS

## 2022-01-13 MED ORDER — ONDANSETRON HCL 4 MG/2ML IJ SOLN
INTRAMUSCULAR | Status: DC | PRN
  Administered 2022-01-13: 4 mg via INTRAVENOUS

## 2022-01-13 MED ORDER — ONDANSETRON HCL 4 MG/2ML IJ SOLN
INTRAMUSCULAR | Status: AC
Start: 2022-01-13 — End: ?
  Filled 2022-01-13: qty 2

## 2022-01-13 MED ORDER — KETOROLAC TROMETHAMINE 30 MG/ML IJ SOLN
INTRAMUSCULAR | Status: AC
Start: 2022-01-13 — End: ?
  Filled 2022-01-13: qty 1

## 2022-01-13 MED ORDER — LIDOCAINE 2% (20 MG/ML) 5 ML SYRINGE
INTRAMUSCULAR | Status: AC
  Filled 2022-01-13: qty 5

## 2022-01-13 MED ORDER — DEXAMETHASONE SODIUM PHOSPHATE 10 MG/ML IJ SOLN
INTRAMUSCULAR | Status: AC
  Filled 2022-01-13: qty 1

## 2022-01-13 MED ORDER — NEOSTIGMINE METHYLSULFATE 10 MG/10ML IV SOLN
INTRAVENOUS | Status: DC | PRN
  Administered 2022-01-13: 3 mg via INTRAVENOUS

## 2022-01-13 MED ORDER — ACETAMINOPHEN 500 MG PO TABS: 1000.0000 mg | ORAL_TABLET | Freq: Once | ORAL | Status: DC | PRN

## 2022-01-13 MED ORDER — PROPOFOL 1000 MG/100ML IV EMUL
INTRAVENOUS | Status: AC
Start: 2022-01-13 — End: ?
  Filled 2022-01-13: qty 100

## 2022-01-13 MED ORDER — ROCURONIUM BROMIDE 10 MG/ML (PF) SYRINGE
PREFILLED_SYRINGE | INTRAVENOUS | Status: AC
  Filled 2022-01-13: qty 10

## 2022-01-13 MED ORDER — CHLORHEXIDINE GLUCONATE 0.12 % MT SOLN
15.0000 mL | Freq: Once | OROMUCOSAL | Status: AC
  Administered 2022-01-13: 15 mL via OROMUCOSAL
  Filled 2022-01-13: qty 15

## 2022-01-13 MED ORDER — GLYCOPYRROLATE PF 0.2 MG/ML IJ SOSY
PREFILLED_SYRINGE | INTRAMUSCULAR | Status: AC
  Filled 2022-01-13: qty 2

## 2022-01-13 MED ORDER — VANCOMYCIN HCL 1000 MG IV SOLR
INTRAVENOUS | Status: DC | PRN
  Administered 2022-01-13: 1250 mg via INTRAVENOUS

## 2022-01-13 MED ORDER — PROPOFOL 10 MG/ML IV BOLUS
INTRAVENOUS | Status: DC | PRN
  Administered 2022-01-13: 200 mg via INTRAVENOUS

## 2022-01-13 MED ORDER — NEOSTIGMINE METHYLSULFATE 3 MG/3ML IV SOSY
PREFILLED_SYRINGE | INTRAVENOUS | Status: AC
  Filled 2022-01-13: qty 3

## 2022-01-13 MED ORDER — OXYCODONE HCL 5 MG PO TABS
ORAL_TABLET | ORAL | Status: AC
  Filled 2022-01-13: qty 1

## 2022-01-13 MED ORDER — FENTANYL CITRATE (PF) 250 MCG/5ML IJ SOLN
INTRAMUSCULAR | Status: DC | PRN
  Administered 2022-01-13 (×2): 50 ug via INTRAVENOUS
  Administered 2022-01-13: 100 ug via INTRAVENOUS

## 2022-01-13 MED ORDER — GLYCOPYRROLATE 0.2 MG/ML IJ SOLN
INTRAMUSCULAR | Status: DC | PRN
  Administered 2022-01-13: .6 mg via INTRAVENOUS

## 2022-01-13 MED ORDER — 0.9 % SODIUM CHLORIDE (POUR BTL) OPTIME
TOPICAL | Status: DC | PRN
  Administered 2022-01-13: 1000 mL

## 2022-01-13 MED ORDER — VANCOMYCIN HCL 1250 MG/250ML IV SOLN
1250.0000 mg | Freq: Once | INTRAVENOUS | Status: DC
  Filled 2022-01-13: qty 250

## 2022-01-13 MED ORDER — ROCURONIUM BROMIDE 100 MG/10ML IV SOLN
INTRAVENOUS | Status: DC | PRN
  Administered 2022-01-13: 10 mg via INTRAVENOUS
  Administered 2022-01-13: 70 mg via INTRAVENOUS

## 2022-01-13 MED ORDER — GLYCOPYRROLATE PF 0.2 MG/ML IJ SOSY
PREFILLED_SYRINGE | INTRAMUSCULAR | Status: AC
  Filled 2022-01-13: qty 1

## 2022-01-13 MED ORDER — FENTANYL CITRATE (PF) 250 MCG/5ML IJ SOLN
INTRAMUSCULAR | Status: AC
  Filled 2022-01-13: qty 5

## 2022-01-13 MED ORDER — LACTATED RINGERS IV SOLN
INTRAVENOUS | Status: DC | PRN
  Administered 2022-01-13: 1031 mL

## 2022-01-13 MED ORDER — KETAMINE HCL 10 MG/ML IJ SOLN
INTRAMUSCULAR | Status: DC | PRN
  Administered 2022-01-13 (×2): 10 mg via INTRAVENOUS
  Administered 2022-01-13: 30 mg via INTRAVENOUS

## 2022-01-13 MED ORDER — FENTANYL CITRATE (PF) 100 MCG/2ML IJ SOLN
INTRAMUSCULAR | Status: AC
  Filled 2022-01-13: qty 2

## 2022-01-13 MED ORDER — MIDAZOLAM HCL 2 MG/2ML IJ SOLN
INTRAMUSCULAR | Status: AC
  Filled 2022-01-13: qty 2

## 2022-01-13 MED ORDER — ACETAMINOPHEN 10 MG/ML IV SOLN
INTRAVENOUS | Status: DC | PRN
  Administered 2022-01-13: 1000 mg via INTRAVENOUS

## 2022-01-13 MED ORDER — OXYCODONE HCL 5 MG/5ML PO SOLN: 5.0000 mg | Freq: Once | ORAL | Status: AC | PRN

## 2022-01-13 MED ORDER — PROPOFOL 500 MG/50ML IV EMUL
INTRAVENOUS | Status: DC | PRN
  Administered 2022-01-13: 150 ug/kg/min via INTRAVENOUS

## 2022-01-13 MED ORDER — LACTATED RINGERS IV SOLN: INTRAVENOUS | Status: DC

## 2022-01-13 MED ORDER — OXYCODONE HCL 5 MG PO TABS
5.0000 mg | ORAL_TABLET | Freq: Once | ORAL | Status: AC | PRN
  Administered 2022-01-13: 5 mg via ORAL

## 2022-01-13 MED ORDER — KETAMINE HCL 50 MG/5ML IJ SOSY
PREFILLED_SYRINGE | INTRAMUSCULAR | Status: AC
  Filled 2022-01-13: qty 5

## 2022-01-13 MED ORDER — EPINEPHRINE PF 1 MG/ML IJ SOLN
INTRAMUSCULAR | Status: AC
  Filled 2022-01-13: qty 1

## 2022-01-13 SURGICAL SUPPLY — 69 items
APL PRP STRL LF DISP 70% ISPRP (MISCELLANEOUS) ×2
APL SKNCLS STERI-STRIP NONHPOA (GAUZE/BANDAGES/DRESSINGS) ×2
BAG COUNTER SPONGE SURGICOUNT (BAG) IMPLANT
BAG SPNG CNTER NS LX DISP (BAG)
BENZOIN TINCTURE PRP APPL 2/3 (GAUZE/BANDAGES/DRESSINGS) ×6 IMPLANT
BINDER BREAST XLRG (GAUZE/BANDAGES/DRESSINGS) ×1 IMPLANT
BIOPATCH RED 1 DISK 7.0 (GAUZE/BANDAGES/DRESSINGS) ×4 IMPLANT
BLADE SURG 10 STRL SS (BLADE) ×24 IMPLANT
BLADE SURG 15 STRL LF DISP TIS (BLADE) ×4 IMPLANT
BLADE SURG 15 STRL SS (BLADE) ×4
BNDG GAUZE ELAST 4 BULKY (GAUZE/BANDAGES/DRESSINGS) ×6 IMPLANT
CHLORAPREP W/TINT 26 (MISCELLANEOUS) ×6 IMPLANT
DEVICE DSSCT PLSMBLD 3.0S LGHT (MISCELLANEOUS) IMPLANT
DRAIN CHANNEL 15F RND FF W/TCR (WOUND CARE) ×6 IMPLANT
DRAPE HALF SHEET 40X57 (DRAPES) ×6 IMPLANT
DRAPE LAPAROSCOPIC ABDOMINAL (DRAPES) ×3 IMPLANT
DRESSING MEPILEX FLEX 4X4 (GAUZE/BANDAGES/DRESSINGS) ×4 IMPLANT
DRSG MEPILEX BORDER 4X12 (GAUZE/BANDAGES/DRESSINGS) ×6 IMPLANT
DRSG MEPILEX FLEX 4X4 (GAUZE/BANDAGES/DRESSINGS) ×4
DRSG MEPILEX SACRM 8.7X9.8 (GAUZE/BANDAGES/DRESSINGS) ×3 IMPLANT
DRSG MEPITEL 4X7.2 (GAUZE/BANDAGES/DRESSINGS) IMPLANT
DRSG MEPITEL 8X12 (GAUZE/BANDAGES/DRESSINGS) IMPLANT
DRSG PAD ABDOMINAL 8X10 ST (GAUZE/BANDAGES/DRESSINGS) ×2 IMPLANT
ELECT BLADE 4.0 EZ CLEAN MEGAD (MISCELLANEOUS)
ELECT REM PT RETURN 9FT ADLT (ELECTROSURGICAL) ×2
ELECTRODE BLDE 4.0 EZ CLN MEGD (MISCELLANEOUS) IMPLANT
ELECTRODE REM PT RTRN 9FT ADLT (ELECTROSURGICAL) ×2 IMPLANT
EVACUATOR SILICONE 100CC (DRAIN) ×6 IMPLANT
GLOVE BIO SURGEON STRL SZ 6.5 (GLOVE) IMPLANT
GLOVE BIO SURGEON STRL SZ7.5 (GLOVE) IMPLANT
GLOVE BIOGEL M STRL SZ7.5 (GLOVE) ×3 IMPLANT
GLOVE BIOGEL PI IND STRL 8 (GLOVE) IMPLANT
GLOVE BIOGEL PI INDICATOR 8 (GLOVE)
GLOVE ECLIPSE 6.5 STRL STRAW (GLOVE) IMPLANT
GLOVE SURG UNDER POLY LF SZ7.5 (GLOVE) IMPLANT
GOWN STRL REUS W/ TWL LRG LVL3 (GOWN DISPOSABLE) ×4 IMPLANT
GOWN STRL REUS W/TWL LRG LVL3 (GOWN DISPOSABLE) ×4
GOWN STRL REUS W/TWL XL LVL3 (GOWN DISPOSABLE) ×3 IMPLANT
KIT BASIN OR (CUSTOM PROCEDURE TRAY) ×3 IMPLANT
MARKER SKIN DUAL TIP RULER LAB (MISCELLANEOUS) ×3 IMPLANT
NDL HYPO 25GX1X1/2 BEV (NEEDLE) IMPLANT
NDL SAFETY ECLIPSE 18X1.5 (NEEDLE) IMPLANT
NDL SPNL 18GX3.5 QUINCKE PK (NEEDLE) ×2 IMPLANT
NEEDLE HYPO 18GX1.5 SHARP (NEEDLE)
NEEDLE HYPO 25GX1X1/2 BEV (NEEDLE) IMPLANT
NEEDLE SPNL 18GX3.5 QUINCKE PK (NEEDLE) ×2 IMPLANT
NS IRRIG 1000ML POUR BTL (IV SOLUTION) ×3 IMPLANT
PACK GENERAL/GYN (CUSTOM PROCEDURE TRAY) ×3 IMPLANT
PIN SAFETY STERILE (MISCELLANEOUS) ×3 IMPLANT
PLASMABLADE 3.0S W/LIGHT (MISCELLANEOUS) ×2
SPONGE T-LAP 18X18 ~~LOC~~+RFID (SPONGE) ×5 IMPLANT
STAPLER INSORB 30 2030 C-SECTI (MISCELLANEOUS) ×5 IMPLANT
STAPLER VISISTAT 35W (STAPLE) ×3 IMPLANT
STRIP CLOSURE SKIN 1/2X4 (GAUZE/BANDAGES/DRESSINGS) ×9 IMPLANT
SUT MNCRL AB 4-0 PS2 18 (SUTURE) ×5 IMPLANT
SUT MON AB 5-0 PS2 18 (SUTURE) ×3 IMPLANT
SUT PDS AB 3-0 SH 27 (SUTURE) ×12 IMPLANT
SUT PDS AB 4-0 SH 27 (SUTURE) ×10 IMPLANT
SUT SILK 2 0 SH (SUTURE) ×4 IMPLANT
SUT VIC AB 2-0 SH 27 (SUTURE) ×6
SUT VIC AB 2-0 SH 27XBRD (SUTURE) ×4 IMPLANT
SUT VLOC 90 P-14 23 (SUTURE) ×8 IMPLANT
SYR 50ML LL SCALE MARK (SYRINGE) ×3 IMPLANT
SYR CONTROL 10ML LL (SYRINGE) IMPLANT
TOWEL GREEN STERILE FF (TOWEL DISPOSABLE) ×3 IMPLANT
TRAY FOLEY W/BAG SLVR 16FR (SET/KITS/TRAYS/PACK)
TRAY FOLEY W/BAG SLVR 16FR ST (SET/KITS/TRAYS/PACK) IMPLANT
TUBING INFILTRATION IT-10001 (TUBING) ×3 IMPLANT
UNDERPAD 30X36 HEAVY ABSORB (UNDERPADS AND DIAPERS) ×4 IMPLANT

## 2022-01-13 NOTE — Interval H&P Note (Signed)
History and Physical Interval Note:  01/13/2022 7:28 AM  Emily Hernandez  has presented today for surgery, with the diagnosis of Breast Hypertrophy.  The various methods of treatment have been discussed with the patient and family. After consideration of risks, benefits and other options for treatment, the patient has consented to  Procedure(s) with comments: MAMMARY REDUCTION  (BREAST) (Bilateral) - 3 hours as a surgical intervention.  The patient's history has been reviewed, patient examined, no change in status, stable for surgery.  I have reviewed the patient's chart and labs.  Questions were answered to the patient's satisfaction.     [name]

## 2022-01-13 NOTE — Discharge Instructions (Addendum)
Activity: Avoid strenuous activity.  No lifting, pushing, or pulling greater than 15 pounds.  Diet: No restrictions.  Try to optimize nutrition with plenty of proteins, fruits, and vegetables to improve healing.   Wound Care: Leave breast binder on for three days. Then take it off, remove the ABD pads and medipore tape. If you need to replace for drainage, please use ABD pads or gauze.  You may then shower normally. Leave the Steri-Strips in place. After a few days, you may transition to a front-clipping/front-zipping compressive sports bra. You may continue to use the breast binder if that is preferred.  Follow-Up: Scheduled for next week.  Things to watch for:  Call the office if you experience fever, chills, intractable vomiting, or significant bleeding.  Mild wound drainage is common after breast reduction surgery and should not be cause for alarm.

## 2022-01-13 NOTE — Anesthesia Procedure Notes (Addendum)
Procedure Name: Intubation Date/Time: 01/13/2022 7:48 AM Performed by: Anastasio Auerbach, CRNA Pre-anesthesia Checklist: Patient identified Patient Re-evaluated:Patient Re-evaluated prior to induction Oxygen Delivery Method: Circle system utilized Preoxygenation: Pre-oxygenation with 100% oxygen Induction Type: IV induction Ventilation: Mask ventilation without difficulty Laryngoscope Size: Mac and 3 Grade View: Grade I Tube type: Oral Tube size: 7.0 mm Number of attempts: 1 Airway Equipment and Method: Stylet Placement Confirmation: ETT inserted through vocal cords under direct vision, positive ETCO2 and breath sounds checked- equal and bilateral Secured at: 21 cm Tube secured with: Tape Dental Injury: Teeth and Oropharynx as per pre-operative assessment

## 2022-01-13 NOTE — Anesthesia Postprocedure Evaluation (Signed)
Anesthesia Post Note  Patient: Emily Hernandez  Procedure(s) Performed: MAMMARY REDUCTION  (BREAST) (Bilateral: Breast)     Patient location during evaluation: PACU Anesthesia Type: General Level of consciousness: awake and alert Pain management: pain level controlled Vital Signs Assessment: post-procedure vital signs reviewed and stable Respiratory status: spontaneous breathing, nonlabored ventilation, respiratory function stable and patient connected to nasal cannula oxygen Cardiovascular status: blood pressure returned to baseline and stable Postop Assessment: no apparent nausea or vomiting Anesthetic complications: no   No notable events documented.  Last Vitals:  Vitals:   01/13/22 1105 01/13/22 1120  BP: 127/63 128/76  Pulse: 77 84  Resp: 18 20  Temp:  37.2 C  SpO2: 98% 99%    Last Pain:  Vitals:   01/13/22 1120  TempSrc:   PainSc: 4                  Breanda Greenlaw

## 2022-01-13 NOTE — Anesthesia Preprocedure Evaluation (Signed)
Anesthesia Evaluation  Patient identified by MRN, date of birth, ID band Patient awake    Reviewed: Allergy & Precautions, NPO status , Patient's Chart, lab work & pertinent test results  History of Anesthesia Complications Negative for: history of anesthetic complications  Airway Mallampati: II  TM Distance: >3 FB Neck ROM: Full    Dental  (+) Teeth Intact, Dental Advisory Given   Pulmonary neg shortness of breath, neg sleep apnea, neg COPD, neg recent URI, former smoker,    breath sounds clear to auscultation       Cardiovascular negative cardio ROS   Rhythm:Regular     Neuro/Psych neg Seizures PSYCHIATRIC DISORDERS Depression Bipolar Disorder    GI/Hepatic negative GI ROS, Neg liver ROS,   Endo/Other  negative endocrine ROS  Renal/GU negative Renal ROS     Musculoskeletal negative musculoskeletal ROS (+)   Abdominal   Peds  Hematology negative hematology ROS (+) Lab Results      Component                Value               Date                      WBC                      5.6                 01/05/2022                HGB                      13.5                01/05/2022                HCT                      40.8                01/05/2022                MCV                      92.1                01/05/2022                PLT                      236                 01/05/2022              Anesthesia Other Findings   Reproductive/Obstetrics                             Anesthesia Physical Anesthesia Plan  ASA: 2  Anesthesia Plan: General   Post-op Pain Management: Toradol IV (intra-op)*, Ofirmev IV (intra-op)* and Ketamine IV*   Induction: Intravenous  PONV Risk Score and Plan: 3 and Ondansetron, Dexamethasone, Propofol infusion and TIVA  Airway Management Planned: Oral ETT  Additional Equipment: None  Intra-op Plan:   Post-operative Plan: Extubation in  OR  Informed Consent: I have reviewed the patients History and Physical, chart, labs  and discussed the procedure including the risks, benefits and alternatives for the proposed anesthesia with the patient or authorized representative who has indicated his/her understanding and acceptance.     Dental advisory given  Plan Discussed with: CRNA  Anesthesia Plan Comments:         Anesthesia Quick Evaluation

## 2022-01-13 NOTE — Transfer of Care (Signed)
Immediate Anesthesia Transfer of Care Note  Patient: Emily Hernandez  Procedure(s) Performed: MAMMARY REDUCTION  (BREAST) (Bilateral: Breast)  Patient Location: PACU  Anesthesia Type:General  Level of Consciousness: awake  Airway & Oxygen Therapy: Patient Spontanous Breathing and Patient connected to nasal cannula oxygen  Post-op Assessment: Report given to RN, Post -op Vital signs reviewed and stable and Patient moving all extremities X 4  Post vital signs: Reviewed and stable  Last Vitals:  Vitals Value Taken Time  BP 118/77   Temp    Pulse 98   Resp 16   SpO2 100     Last Pain:  Vitals:   01/13/22 0659  TempSrc:   PainSc: 0-No pain         Complications: No notable events documented.

## 2022-01-13 NOTE — Op Note (Signed)
Operative Note   DATE OF OPERATION: 01/13/2022  LOCATION: [place]   SURGICAL DEPARTMENT: Plastic Surgery  PREOPERATIVE DIAGNOSES: Bilateral symptomatic macromastia.  POSTOPERATIVE DIAGNOSES:  same  PROCEDURE: Bilateral breast reduction with superomedial pedicle.  SURGEON: [name], MD  ASSISTANT: Krista Blue, PA  ANESTHESIA: General.  COMPLICATIONS: None.   INDICATIONS FOR PROCEDURE:  The patient, Emily Hernandez is a 26 y.o. female born on 09-Jun-1996, is here for treatment of bilateral symptomatic macromastia. MRN: [id number]  CONSENT:  Informed consent was obtained directly from the patient. Risks, benefits and alternatives were fully discussed. Specific risks including but not limited to bleeding, infection, hematoma, seroma, scarring, pain, infection, contracture, asymmetry, wound healing problems, and need for further surgery were all discussed. The patient did have an ample opportunity to have questions answered to satisfaction.   DESCRIPTION OF PROCEDURE:  The patient was marked preoperatively for a Wise pattern skin excision.  The patient was taken to the operating room. SCDs were placed and antibiotics were given. General anesthesia was administered.The patient's operative site was prepped and draped in a sterile fashion. A time out was performed and all information was confirmed to be correct.  Right Breast: The breast was infiltrated with tumescent solution to help with hemostasis.  The nipple was marked with a cookie cutter.  A superomedial pedicle was drawn out with the base of at least 8 cm in size.  A breast tourniquet was then applied and the pedicle was de-epithelialized.  Breast tourniquet was then let down and all incisions were made with a 10 blade.  The pedicle was then isolated down to the chest wall with cautery and the excision was performed removing tissue primarily inferiorly and laterally.  Hemostasis was obtained and the wound  was stapled closed.  Left breast:  The breast was infiltrated with tumescent solution to help with hemostasis.  The nipple was marked with a cookie cutter.  A superomedial pedicle was drawn out with the base of at least 8 cm in size.  A breast tourniquet was then applied and the pedicle was de-epithelialized.  Breast tourniquet was then let down and all incisions were made with a 10 blade.  The pedicle was then isolated down to the chest wall with cautery and the excision was performed removing tissue primarily inferiorly and laterally.  Hemostasis was obtained and the wound was stapled closed.  Patient was then set up to check for size and symmetry.  Minor modifications were made.  This resulted in a total of 640 g removed from the right side and 700 g removed from the left side.  The inframammary incision was closed with a combination of buried in-sorb staples, 3-0 PDS and a running V-lock 90.  The vertical and periareolar limbs were closed with interrupted buried 4-0 PDS and a running V-lock 90 suture.  Steri-Strips were then applied along with a soft dressing and breast binder.  The assistant Krista Blue, PA was necessary for retraction,breast positioning, and assistance with various parts of the case.   The patient tolerated the procedure well.  There were no complications. The patient was allowed to wake from anesthesia, extubated and taken to the recovery room in satisfactory condition.  I was present for the entire procedure.

## 2022-01-14 ENCOUNTER — Encounter (HOSPITAL_COMMUNITY): Payer: Self-pay | Admitting: Plastic Surgery

## 2022-01-14 ENCOUNTER — Ambulatory Visit: Payer: 59 | Admitting: Licensed Practical Nurse

## 2022-01-14 LAB — SURGICAL PATHOLOGY

## 2022-01-15 ENCOUNTER — Telehealth: Payer: Self-pay | Admitting: *Deleted

## 2022-01-15 NOTE — Telephone Encounter (Signed)
STD paperwork completed and faxed to [phone] with fax confirmation received. [employer] sent for batch scanning

## 2022-01-19 ENCOUNTER — Ambulatory Visit (INDEPENDENT_AMBULATORY_CARE_PROVIDER_SITE_OTHER): Payer: Managed Care, Other (non HMO) | Admitting: Plastic Surgery

## 2022-01-19 DIAGNOSIS — M546 Pain in thoracic spine: Secondary | ICD-10-CM

## 2022-01-19 DIAGNOSIS — N62 Hypertrophy of breast: Secondary | ICD-10-CM

## 2022-01-19 NOTE — Progress Notes (Signed)
Status post bilateral breast reduction on 01/13/2022.  Patient is doing well without complaints.  Physical exam Incisions clean dry and intact, good initial symmetry  Assessment and plan Doing well after initial breast reduction.  We will follow-up in 2 to 3 weeks for recheck and removal of Steri-Strips and any suture knots.

## 2022-01-30 NOTE — Progress Notes (Unsigned)
Patient is a 26 year old female with PMH of macromastia s/p bilateral breast reduction performed 01/13/2022 Dr. Erin Hearing who presents to clinic for postoperative follow-up.  She was last seen here in clinic on 01/19/2022.  At that time, exam was benign and plan was for continued compressive garments and activity modifications.  Today,

## 2022-02-02 ENCOUNTER — Ambulatory Visit (INDEPENDENT_AMBULATORY_CARE_PROVIDER_SITE_OTHER): Payer: Managed Care, Other (non HMO) | Admitting: Physician Assistant

## 2022-02-02 DIAGNOSIS — Z9889 Other specified postprocedural states: Secondary | ICD-10-CM

## 2022-02-11 ENCOUNTER — Ambulatory Visit: Payer: 59 | Admitting: Licensed Practical Nurse

## 2022-02-14 ENCOUNTER — Other Ambulatory Visit: Payer: Self-pay | Admitting: Obstetrics and Gynecology

## 2022-02-14 DIAGNOSIS — Z3042 Encounter for surveillance of injectable contraceptive: Secondary | ICD-10-CM

## 2022-02-14 DIAGNOSIS — R87612 Low grade squamous intraepithelial lesion on cytologic smear of cervix (LGSIL): Secondary | ICD-10-CM | POA: Insufficient documentation

## 2022-02-16 NOTE — Telephone Encounter (Signed)
Called pt, no answer, [facility]. 

## 2022-02-16 NOTE — Telephone Encounter (Signed)
Will you notify pt of depo available in office? Thx.

## 2022-02-19 ENCOUNTER — Ambulatory Visit (INDEPENDENT_AMBULATORY_CARE_PROVIDER_SITE_OTHER): Payer: 59 | Admitting: Obstetrics & Gynecology

## 2022-02-19 ENCOUNTER — Other Ambulatory Visit (HOSPITAL_COMMUNITY)
Admission: RE | Admit: 2022-02-19 | Discharge: 2022-02-19 | Disposition: A | Discharge status: Home / Self Care | Payer: Managed Care, Other (non HMO) | Source: Ambulatory Visit | Attending: Obstetrics & Gynecology | Admitting: Obstetrics & Gynecology

## 2022-02-19 ENCOUNTER — Encounter: Payer: Self-pay | Admitting: Obstetrics & Gynecology

## 2022-02-19 VITALS — BP 118/60 | Ht 64.0 in | Wt 174.0 lb

## 2022-02-19 DIAGNOSIS — Z3042 Encounter for surveillance of injectable contraceptive: Secondary | ICD-10-CM | POA: Diagnosis not present

## 2022-02-19 DIAGNOSIS — Z124 Encounter for screening for malignant neoplasm of cervix: Secondary | ICD-10-CM | POA: Insufficient documentation

## 2022-02-19 DIAGNOSIS — Z01419 Encounter for gynecological examination (general) (routine) without abnormal findings: Secondary | ICD-10-CM

## 2022-02-19 MED ORDER — MEDROXYPROGESTERONE ACETATE 150 MG/ML IM SUSP
150.0000 mg | Freq: Once | INTRAMUSCULAR | Status: AC
  Administered 2022-02-19: 150 mg via INTRAMUSCULAR

## 2022-02-19 MED ORDER — MEDROXYPROGESTERONE ACETATE 150 MG/ML IM SUSP: 150.0000 mg | Freq: Once | INTRAMUSCULAR | Status: DC

## 2022-02-19 NOTE — Progress Notes (Signed)
Subjective:    Emily Hernandez is a 26 y.o. female who presents for an annual exam. The patient has no complaints today. The patient is sexually active. GYN screening history: last pap: was normal. The patient wears seatbelts: yes. The patient participates in regular exercise: yes. The patient reports that there is not domestic violence in her life.   Menstrual History: OB History     Gravida  1   Para      Term      Preterm      AB  1   Living         SAB      IAB      Ectopic      Multiple      Live Births           Obstetric Comments  Pt had elective abortion, date unknown.         No LMP recorded. Patient has had an injection.    The following portions of the patient's history were reviewed and updated as appropriate: allergies, current medications, past family history, past medical history, past social history, past surgical history, and problem list.  Review of Systems Pertinent items are noted in HPI.    Objective:    BP 118/60   Ht '5\' 4"'$  (1.626 m)   Wt 174 lb (78.9 kg)   BMI 29.87 kg/m   General Appearance:    Alert, cooperative, no distress, appears stated age  Head:    Normocephalic, without obvious abnormality, atraumatic  Eyes:    PERRL, conjunctiva/corneas clear, EOM's intact, fundi    benign, both eyes  Ears:    Normal TM's and external ear canals, both ears  Nose:   Nares normal, septum midline, mucosa normal, no drainage    or sinus tenderness  Throat:   Lips, mucosa, and tongue normal; teeth and gums normal  Neck:   Supple, symmetrical, trachea midline, no adenopathy;    thyroid:  no enlargement/tenderness/nodules; no carotid   bruit or JVD  Back:     Symmetric, no curvature, ROM normal, no CVA tenderness  Lungs:     Clear to auscultation bilaterally, respirations unlabored  Chest Wall:    No tenderness or deformity   Heart:    Regular rate and rhythm, S1 and S2 normal, no murmur, rub   or gallop  Breast Exam:    No tenderness,  masses, or nipple abnormality  Abdomen:     Soft, non-tender, bowel sounds active all four quadrants,    no masses, no organomegaly  Genitalia:   Normal female without lesion, discharge or tenderness, normal size and shape, retroverted, normal adnexal exam   Rectal:    Extremities:   Extremities normal, atraumatic, no cyanosis or edema  Pulses:   2+ and symmetric all extremities  Skin:   Skin color, texture, turgor normal, no rashes or lesions  Lymph nodes:   Cervical, supraclavicular, and axillary nodes normal  Neurologic:   CNII-XII intact, normal strength, sensation and reflexes    throughout  .    Assessment:    Healthy female exam.    Plan:     Await pap smear results. Continue depo provera every 12 weeks.

## 2022-02-24 ENCOUNTER — Encounter: Payer: Self-pay | Admitting: Obstetrics & Gynecology

## 2022-02-24 ENCOUNTER — Telehealth: Payer: Self-pay | Admitting: Obstetrics & Gynecology

## 2022-02-24 LAB — CYTOLOGY - PAP
Chlamydia: NEGATIVE
Comment: NEGATIVE
Comment: NORMAL
Neisseria Gonorrhea: NEGATIVE

## 2022-02-24 NOTE — Telephone Encounter (Signed)
Contacted pt to schedule a colposcopy with Dr. Hulan Fray.  Left message for pt to call back to schedule.  Possible date for colpo is 8/8.

## 2022-02-27 ENCOUNTER — Encounter: Payer: Managed Care, Other (non HMO) | Admitting: Student

## 2022-02-27 ENCOUNTER — Ambulatory Visit (INDEPENDENT_AMBULATORY_CARE_PROVIDER_SITE_OTHER): Payer: Managed Care, Other (non HMO) | Admitting: Student

## 2022-02-27 DIAGNOSIS — Z9889 Other specified postprocedural states: Secondary | ICD-10-CM

## 2022-02-27 NOTE — Progress Notes (Deleted)
Patient is a 26 year old female with history of symptomatic macromastia.  Patient underwent bilateral breast reduction with Dr. Erin Hearing on 01/13/2022.  She presents to the clinic for postoperative follow-up.  Patient was last seen in the clinic on 02/02/2022.  At this visit, patient reported she was doing well.  She did report some intermittent, short-lasting shocklike discomforts and some soreness with arm movement.  Patient's physical exam was reassuring.    Today,

## 2022-02-27 NOTE — Progress Notes (Signed)
Patient is a 26 year old female who underwent bilateral breast reduction with Dr. Erin Hearing on 01/13/2022.  Presents to the clinic for postoperative follow-up.  Patient was last seen in the clinic on 01/27/2022.  At that visit, she was overall doing well.  She did report she had some intermittent sharp pains and some soreness with arm movement.  Her physical exam was reassuring.   Today, patient reports she is doing well.  She states the pain has been improving.  She reports she sometimes has some soreness.  She reports she has noticed a small amount of blood and drainage underneath the Steri-Strip that is still in place.  She also states that she has noticed some sutures that have been protruding and bothering her.  She denies any fevers or chills.  She denies any purulent drainage from the area.  She denies any other issues.  On exam, patient is sitting upright in no acute distress.  Breasts are symmetric and soft.  There is no overlying erythema or ecchymosis.  NAC's appear viable bilaterally.  Steri-Strips were removed from the incisions.  There is a small superficial wound to the left vertical limb.  There are 1-2 small superficial wounds to the inferior vertical limb on the right breast.  Incisions are otherwise intact.  There is no surrounding erythema or drainage.  There are no cellulitic changes to the skin.  There were several sutures protruding from the incisions.  These were trimmed and patient tolerated well.  I discussed with patient that she can put a small amount of Vaseline over the incisions and the wounds daily.  I discussed with the patient she can slowly increase her activity.  I discussed with the patient that she should avoid submerging her incisions, especially given that she currently has some wounds.  I discussed with the patient that the sharp, tingling pain is most likely related to nerve pain and that this should continue to decrease over time.  I discussed with the patient that if  she feels she is having any tenderness or soreness, she may try ibuprofen or Tylenol for the pain.  Patient acknowledged.  Patient to follow-up in 2 weeks.  Instructed patient to call if she has any questions or concerns.  Pictures were obtained of the patient and placed in the patient's chart with the patient's permission.

## 2022-03-13 ENCOUNTER — Ambulatory Visit (INDEPENDENT_AMBULATORY_CARE_PROVIDER_SITE_OTHER): Payer: Managed Care, Other (non HMO) | Admitting: Student

## 2022-03-13 DIAGNOSIS — Z9889 Other specified postprocedural states: Secondary | ICD-10-CM

## 2022-03-13 NOTE — Progress Notes (Signed)
Patient is a 26 year old female who underwent bilateral breast reduction with Dr. Erin Hearing on 01/13/2022.  Presents to the clinic for postoperative follow-up.  Patient was last seen in the clinic on 02/27/2022.  At this visit, she was doing well.  She reported her pain was improving.  On exam, patient had a small superficial wound to the left vertical limb, and 1-2 small superficial wounds to the inferior vertical limb on the right breast.  Plan was for patient to apply Vaseline daily and to follow-up in 2 weeks.  Today, patient reports she is doing well.  She states that the wounds that were present at her last visit are almost completely gone.  She denies any drainage from these wounds.  She denies any swelling or pain.  Patient denies any fevers or chills.  Patient has no other complaints at this time.  On exam, patient is sitting upright in no acute distress.  Breasts are soft and symmetric bilaterally.  There is no overlying ecchymosis or erythema.  NAC's are viable bilaterally.  There are some very small superficial wounds to the inferior aspect of the left NAC and on the vertical limb and inframammary incisions of the right breast.  Otherwise incisions are intact and healing well.  There are 1 or 2 small bumps along the incision that can be palpated.  These are consistent with either a suture or an underlying INSORB staple underneath the skin.  They do not appear infected.  I discussed with the patient that she can put a small amount of Vaseline on the wounds that are remaining.  I discussed with the patient that she may start scar creams or silicone sheets in the next week or 2, once the wounds are healed.  I discussed with the patient that the sutures or INSORB staples may eventually protrude from the skin.  I discussed with the patient that she can follow-up with [name] if she would like [name] to evaluate and/or trim these sutures or staples.  Patient acknowledged.  I discussed with the patient she may  submerge her incisions once her wounds have completely healed.  Patient acknowledged.  Patient to follow-up as needed.  Instructed patient to call if she has any questions or concerns.

## 2022-03-17 ENCOUNTER — Telehealth: Payer: Self-pay | Admitting: Adult Health

## 2022-03-17 DIAGNOSIS — F319 Bipolar disorder, unspecified: Secondary | ICD-10-CM

## 2022-03-17 DIAGNOSIS — F331 Major depressive disorder, recurrent, moderate: Secondary | ICD-10-CM

## 2022-03-17 NOTE — Telephone Encounter (Signed)
Pt called and said that she changed pharmacies. She needs her addderall 20 mg and her topamax 100 mg sent to the [facility] on [address] in [place]

## 2022-03-18 ENCOUNTER — Other Ambulatory Visit: Payer: Self-pay

## 2022-03-18 DIAGNOSIS — F902 Attention-deficit hyperactivity disorder, combined type: Secondary | ICD-10-CM

## 2022-03-18 MED ORDER — AMPHETAMINE-DEXTROAMPHETAMINE 20 MG PO TABS: 20.0000 mg | ORAL_TABLET | Freq: Two times a day (BID) | ORAL | 0 refills | Status: DC

## 2022-03-18 MED ORDER — TOPIRAMATE 100 MG PO TABS: ORAL_TABLET | ORAL | 1 refills | Status: DC

## 2022-03-18 NOTE — Telephone Encounter (Signed)
Pended.

## 2022-03-19 NOTE — Telephone Encounter (Signed)
Contacted pt about scheduling a colpo that Dr. Hulan Fray indicated that the pt needed.  Pt states that she is not going to do the colposcopy.

## 2022-03-26 ENCOUNTER — Ambulatory Visit (INDEPENDENT_AMBULATORY_CARE_PROVIDER_SITE_OTHER): Payer: 59 | Admitting: Adult Health

## 2022-03-26 ENCOUNTER — Encounter: Payer: Self-pay | Admitting: Adult Health

## 2022-03-26 DIAGNOSIS — F411 Generalized anxiety disorder: Secondary | ICD-10-CM

## 2022-03-26 DIAGNOSIS — G4700 Insomnia, unspecified: Secondary | ICD-10-CM | POA: Diagnosis not present

## 2022-03-26 DIAGNOSIS — F902 Attention-deficit hyperactivity disorder, combined type: Secondary | ICD-10-CM | POA: Diagnosis not present

## 2022-03-26 DIAGNOSIS — F331 Major depressive disorder, recurrent, moderate: Secondary | ICD-10-CM | POA: Diagnosis not present

## 2022-03-26 DIAGNOSIS — F319 Bipolar disorder, unspecified: Secondary | ICD-10-CM | POA: Diagnosis not present

## 2022-03-26 MED ORDER — AMPHETAMINE-DEXTROAMPHETAMINE 20 MG PO TABS
20.0000 mg | ORAL_TABLET | Freq: Two times a day (BID) | ORAL | 0 refills | Status: DC
Start: 2022-03-26 — End: 2022-06-25

## 2022-03-26 MED ORDER — AMPHETAMINE-DEXTROAMPHETAMINE 20 MG PO TABS
20.0000 mg | ORAL_TABLET | Freq: Two times a day (BID) | ORAL | 0 refills | Status: DC
Start: 2022-05-21 — End: 2022-08-24

## 2022-03-26 MED ORDER — AMPHETAMINE-DEXTROAMPHETAMINE 20 MG PO TABS
20.0000 mg | ORAL_TABLET | Freq: Two times a day (BID) | ORAL | 0 refills | Status: DC
Start: 2022-04-23 — End: 2022-08-24

## 2022-03-26 NOTE — Progress Notes (Signed)
Emily Hernandez [id number] February 11, 1996 26 y.o.  Subjective:   Patient ID:  Emily Hernandez is a 26 y.o. (DOB 05/30/1996) female.  Chief Complaint: No chief complaint on file.   HPI TRYPHENA PERKOVICH presents to the office today for follow-up of ADHD, MDD, GAD, insomnia, and Bipolar disorder.   Describes mood today as "ok". Pleasant. Tearful at times. Mood symptoms - reports some depression, anxiety, and irritability. Moods are lower - declined. Stating "I'm not doing as well as I was". Reports running out of medications for a week and a half. Has recently restarted them and doing a little better. Recovering from breast reduction. Feels like medications continue to work. She and boyfriend doing well. Mother local and supportive.  Stable interest and motivation. Taking other medications as prescribed. Energy levels stable. Active, does not have a regular exercise routine. Walking dogs. Enjoys some usual interests and activities. Dating. Lives with boyfriend. Mother in [place].  Appetite adequate. Weight gain 170 pounds. Sleeps well most nights. Averages 10 hours.  Focus and concentration improved. Completing tasks. Managing aspects of household. Works full-time -  at [employer] (3rd shift) and  Denies SI or HI. Denies AH or VH.  Previous medication trials: Deneen Harts, Lamictal, Topamax, Vraylar      GAD-7    Flowsheet Row Procedure visit from 09/23/2020 in [facility] Office Visit from 08/02/2020 in [facility]  Total GAD-7 Score 15 12      PHQ2-9    Flowsheet Row Procedure visit from 09/23/2020 in [facility] Office Visit from 08/02/2020 in [facility]  PHQ-2 Total Score 4 0  PHQ-9 Total Score 16 9      Flowsheet Row Admission (Discharged) from 01/13/2022 in [facility] from 01/05/2022 in [facility] PREADMISSION TESTING  C-SSRS RISK CATEGORY No Risk No Risk         Review of Systems:  Review of Systems  Musculoskeletal:  Negative for gait problem.  Neurological:  Negative for tremors.  Psychiatric/Behavioral:         Please refer to HPI    Medications: I have reviewed the patient's current medications.  Current Outpatient Medications  Medication Sig Dispense Refill   alclomethasone (ACLOVATE) 0.05 % ointment Apply to lips twice a day until improved. 15 g 1   amphetamine-dextroamphetamine (ADDERALL) 20 MG tablet Take 1 tablet (20 mg total) by mouth 2 (two) times daily. 60 tablet 0   amphetamine-dextroamphetamine (ADDERALL) 20 MG tablet Take 1 tablet (20 mg total) by mouth 2 (two) times daily. 60 tablet 0   amphetamine-dextroamphetamine (ADDERALL) 20 MG tablet Take 1 tablet (20 mg total) by mouth 2 (two) times daily. 60 tablet 0   aspirin-acetaminophen-caffeine (EXCEDRIN MIGRAINE) 250-250-65 MG tablet Take 1-2 tablets by mouth every 6 (six) hours as needed for headache.     BIOTIN PO Take 1 tablet by mouth daily.     buPROPion (WELLBUTRIN XL) 150 MG 24 hr tablet TAKE 3 TABLETS BY MOUTH EVERY MORNING 270 tablet 1   cyanocobalamin 1000 MCG tablet Take 1,000 mcg by mouth daily.     ibuprofen (ADVIL) 200 MG tablet Take 400 mg by mouth every 6 (six) hours as needed for moderate pain.     lamoTRIgine (LAMICTAL) 100 MG tablet Take one tablet daily. 90 tablet 1   loratadine (CLARITIN) 10 MG tablet Take 10 mg by mouth daily.     medroxyPROGESTERone Acetate 150 MG/ML SUSY INJECT 1 ML (  150 MG TOTAL) INTO THE MUSCLE ONCE FOR 1 DOSE. 1 mL 0   ondansetron (ZOFRAN-ODT) 4 MG disintegrating tablet Take 1 tablet (4 mg total) by mouth every 8 (eight) hours as needed for nausea or vomiting. 20 tablet 0   topiramate (TOPAMAX) 100 MG tablet Take one tablet daily. 90 tablet 1   valACYclovir (VALTREX) 1000 MG tablet Take 2 tabs BID x 1 day prn sx 30 tablet 1   Current Facility-Administered Medications  Medication Dose Route Frequency Provider Last Rate Last Admin    medroxyPROGESTERone (DEPO-PROVERA) injection 150 mg  150 mg Intramuscular Once Dove, Myra C, MD        Medication Side Effects: None  Allergies:  Allergies  Allergen Reactions   Penicillins Hives and Swelling    Past Medical History:  Diagnosis Date   Bipolar disorder ([place])    Cold sore    Depression    Dysplastic nevus 10/15/2020   Left upper back, severe. Exc 02/03/21 2:30pm.   Idiopathic intracranial hypertension    Vaccine for human papilloma virus (HPV) types 6, 11, 16, and 18 administered     Past Medical History, Surgical history, Social history, and Family history were reviewed and updated as appropriate.   Please see review of systems for further details on the patient's review from today.   Objective:   Physical Exam:  There were no vitals taken for this visit.  Physical Exam Constitutional:      General: She is not in acute distress. Musculoskeletal:        General: No deformity.  Neurological:     Mental Status: She is alert and oriented to person, place, and time.     Coordination: Coordination normal.  Psychiatric:        Attention and Perception: Attention and perception normal. She does not perceive auditory or visual hallucinations.        Mood and Affect: Mood normal. Mood is not anxious or depressed. Affect is not labile, blunt, angry or inappropriate.        Speech: Speech normal.        Behavior: Behavior normal.        Thought Content: Thought content normal. Thought content is not paranoid or delusional. Thought content does not include homicidal or suicidal ideation. Thought content does not include homicidal or suicidal plan.        Cognition and Memory: Cognition and memory normal.        Judgment: Judgment normal.     Comments: Insight intact     Lab Review:     Component Value Date/Time   NA 138 05/01/2019 1303   K 4.0 05/01/2019 1303   CL 106 05/01/2019 1303   CO2 22 05/01/2019 1303   GLUCOSE 94 05/01/2019 1303   BUN 13 05/01/2019  1303   CREATININE 0.85 05/01/2019 1303   CREATININE 0.75 01/30/2014 1309   CALCIUM 9.2 05/01/2019 1303   PROT 7.5 05/01/2019 1303   ALBUMIN 4.2 05/01/2019 1303   AST 14 (L) 05/01/2019 1303   ALT 15 05/01/2019 1303   ALKPHOS 43 05/01/2019 1303   BILITOT 0.5 05/01/2019 1303   GFRNONAA >60 05/01/2019 1303   GFRAA >60 05/01/2019 1303       Component Value Date/Time   WBC 5.6 01/05/2022 1019   RBC 4.43 01/05/2022 1019   HGB 13.5 01/05/2022 1019   HGB 13.4 03/04/2018 1009   HCT 40.8 01/05/2022 1019   HCT 41.2 03/04/2018 1009   PLT 236 01/05/2022 1019  PLT 233 03/04/2018 1009   MCV 92.1 01/05/2022 1019   MCV 91 03/04/2018 1009   MCH 30.5 01/05/2022 1019   MCHC 33.1 01/05/2022 1019   RDW 12.9 01/05/2022 1019   RDW 12.0 (L) 03/04/2018 1009   LYMPHSABS 1.4 03/04/2018 1009   MONOABS 0.4 09/03/2008 2035   EOSABS 0.0 03/04/2018 1009   BASOSABS 0.0 03/04/2018 1009    No results found for: "POCLITH", "LITHIUM"   No results found for: "PHENYTOIN", "PHENOBARB", "VALPROATE", "CBMZ"   .res Assessment: Plan:    Plan:  PDMP reviewed  Lamictal '100mg'$  at hs Topamax '100mg'$  at hs Wellbutrin XL '450mg'$  daily for depression  Adderall XR '20mg'$  BID    RTC 3 months  114/81/76  [facility] ADHD screening completed 40/58 - ADHD likely  Patient advised to contact office with any questions, adverse effects, or acute worsening in signs and symptoms.  Counseled patient regarding potential benefits, risks, and side effects of Lamictal to include potential risk of Stevens-Johnson syndrome. Advised patient to stop taking Lamictal and contact office immediately if rash develops and to seek urgent medical attention if rash is severe and/or spreading quickly. There are no diagnoses linked to this encounter.   Please see After Visit Summary for patient specific instructions.  No future appointments.  No orders of the defined types were placed in this  encounter.   -------------------------------

## 2022-04-10 ENCOUNTER — Emergency Department: Payer: Managed Care, Other (non HMO)

## 2022-04-10 ENCOUNTER — Emergency Department
Admission: EM | Admit: 2022-04-10 | Discharge: 2022-04-10 | Disposition: A | Discharge status: Home / Self Care | Payer: Managed Care, Other (non HMO) | Attending: Emergency Medicine | Admitting: Emergency Medicine

## 2022-04-10 ENCOUNTER — Telehealth: Payer: Self-pay

## 2022-04-10 ENCOUNTER — Other Ambulatory Visit: Payer: Self-pay

## 2022-04-10 ENCOUNTER — Encounter: Payer: Self-pay | Admitting: Emergency Medicine

## 2022-04-10 DIAGNOSIS — R1033 Periumbilical pain: Secondary | ICD-10-CM | POA: Diagnosis present

## 2022-04-10 DIAGNOSIS — I880 Nonspecific mesenteric lymphadenitis: Secondary | ICD-10-CM | POA: Insufficient documentation

## 2022-04-10 LAB — URINALYSIS, ROUTINE W REFLEX MICROSCOPIC
Bilirubin Urine: NEGATIVE
Glucose, UA: NEGATIVE mg/dL
Hgb urine dipstick: NEGATIVE
Ketones, ur: NEGATIVE mg/dL
Leukocytes,Ua: NEGATIVE
Nitrite: NEGATIVE
Protein, ur: NEGATIVE mg/dL
Specific Gravity, Urine: 1.025 (ref 1.005–1.030)
pH: 7 (ref 5.0–8.0)

## 2022-04-10 LAB — COMPREHENSIVE METABOLIC PANEL
ALT: 14 U/L (ref 0–44)
AST: 11 U/L — ABNORMAL LOW (ref 15–41)
Albumin: 4.5 g/dL (ref 3.5–5.0)
Alkaline Phosphatase: 41 U/L (ref 38–126)
Anion gap: 4 — ABNORMAL LOW (ref 5–15)
BUN: 12 mg/dL (ref 6–20)
CO2: 23 mmol/L (ref 22–32)
Calcium: 9.5 mg/dL (ref 8.9–10.3)
Chloride: 112 mmol/L — ABNORMAL HIGH (ref 98–111)
Creatinine, Ser: 0.81 mg/dL (ref 0.44–1.00)
GFR, Estimated: >60 mL/min (ref >60)
Glucose, Bld: 96 mg/dL (ref 70–99)
Potassium: 3.8 mmol/L (ref 3.5–5.1)
Sodium: 139 mmol/L (ref 135–145)
Total Bilirubin: 0.5 mg/dL (ref 0.3–1.2)
Total Protein: 7.3 g/dL (ref 6.5–8.1)

## 2022-04-10 LAB — CBC
HCT: 42 % (ref 36.0–46.0)
Hemoglobin: 13.9 g/dL (ref 12.0–15.0)
MCH: 29.4 pg (ref 26.0–34.0)
MCHC: 33.1 g/dL (ref 30.0–36.0)
MCV: 88.8 fL (ref 80.0–100.0)
Platelets: 237 10*3/uL (ref 150–400)
RBC: 4.73 MIL/uL (ref 3.87–5.11)
RDW: 12 % (ref 11.5–15.5)
WBC: 5 10*3/uL (ref 4.0–10.5)
nRBC: 0 % (ref 0.0–0.2)

## 2022-04-10 LAB — POC URINE PREG, ED: Preg Test, Ur: NEGATIVE

## 2022-04-10 LAB — LIPASE, BLOOD: Lipase: 33 U/L (ref 11–51)

## 2022-04-10 MED ORDER — IOHEXOL 300 MG/ML  SOLN
100.0000 mL | Freq: Once | INTRAMUSCULAR | Status: AC | PRN
  Administered 2022-04-10: 100 mL via INTRAVENOUS

## 2022-04-10 MED ORDER — HYDROCODONE-ACETAMINOPHEN 5-325 MG PO TABS: 1.0000 | ORAL_TABLET | Freq: Four times a day (QID) | ORAL | 0 refills | Status: DC | PRN

## 2022-04-10 MED ORDER — AMOXICILLIN-POT CLAVULANATE 875-125 MG PO TABS: 1.0000 | ORAL_TABLET | Freq: Two times a day (BID) | ORAL | 0 refills | Status: DC

## 2022-04-10 NOTE — ED Notes (Signed)
54 yof with a c/c of lower abdominal pain for the past 6 days. The pt denies andy other symptoms. The pt is warm, pink, and dry. The pt is alert and oriented x 4.

## 2022-04-10 NOTE — ED Triage Notes (Signed)
Pt to ED via POV c/o abdominal pain, back pain, and nausea. Pt states that she has been having symptoms for 6 days. Pt denies fevers. Pt is currently in NAD.

## 2022-04-10 NOTE — Discharge Instructions (Signed)
It looks on the CT like you have mesenteric adenitis or swollen lymph glands in your abdomen.  This can be quite painful.  I will give you some Augmentin 1 pill twice a day with food since you said you could take amoxicillin without any difficulty.  This should help with the pain.  I will also give you some Vicodin which she can use at bedtime so she can sleep better.  1 or 2 every night.  If you are not any better in the next 3 to 4 days or if you get worse with increasing pain or fever or vomiting please return or see your doctor.

## 2022-04-10 NOTE — ED Provider Notes (Signed)
[facility] Provider Note    Event Date/Time   First MD Initiated Contact with Patient 04/10/22 1113     (approximate)   History   Abdominal Pain   HPI  Emily Hernandez is a 26 y.o. female who is complaining of abdominal pain and back pain.  Pain is in the low back and below the umbilicus but above the pelvis and her belly.  It is deep and achy.  Is been going on for couple days.  She could not sleep last night because of it.  She has had no fever or nausea or vomiting.  Temperature here is 99.      Physical Exam   Triage Vital Signs: ED Triage Vitals  Enc Vitals Group     BP 04/10/22 1049 123/88     Pulse Rate 04/10/22 1049 (!) 106     Resp 04/10/22 1049 18     Temp 04/10/22 1049 99 F (37.2 C)     Temp Source 04/10/22 1049 Oral     SpO2 04/10/22 1049 100 %     Weight 04/10/22 1050 173 lb (78.5 kg)     Height 04/10/22 1050 '5\' 4"'$  (1.626 m)     Head Circumference --      Peak Flow --      Pain Score 04/10/22 1056 7     Pain Loc --      Pain Edu? --      Excl. in [place]? --     Most recent vital signs: Vitals:   04/10/22 1049  BP: 123/88  Pulse: (!) 106  Resp: 18  Temp: 99 F (37.2 C)  SpO2: 100%     General: Awake, no distress.  CV:  Good peripheral perfusion.  Heart regular rate and rhythm no audible murmurs Resp:  Normal effort.  Lungs are clear Abd:  No distention.  Soft bowel sounds are positive there is some tenderness palpation below the umbilicus but there is no suprapubic tenderness. Extremities: No edema  ED Results / Procedures / Treatments   Labs (all labs ordered are listed, but only abnormal results are displayed) Labs Reviewed  COMPREHENSIVE METABOLIC PANEL - Abnormal; Notable for the following components:      Result Value   Chloride 112 (*)    AST 11 (*)    Anion gap 4 (*)    All other components within normal limits  URINALYSIS, ROUTINE W REFLEX MICROSCOPIC - Abnormal; Notable for the following components:    Color, Urine YELLOW (*)    APPearance CLOUDY (*)    All other components within normal limits  LIPASE, BLOOD  CBC  POC URINE PREG, ED     EKG     RADIOLOGY  CT abdomen read by radiology reviewed and interpreted by me shows only some mesenteric adenitis.  PROCEDURES:  Critical Care performed:   Procedures   MEDICATIONS ORDERED IN ED: Medications  iohexol (OMNIPAQUE) 300 MG/ML solution 100 mL (100 mLs Intravenous Contrast Given 04/10/22 1258)     IMPRESSION / MDM / ASSESSMENT AND PLAN / ED COURSE  I reviewed the triage vital signs and the nursing notes.   Differential diagnosis includes, but is not limited to, appendicitis is a possibility which was ruled out via CT diverticulitis also PID or ovarian torsion could be possible but usually they are lower down in the pelvis and tender and on the middle of the belly just under the umbilicus.  She does have swollen glands on  the CT which is consistent with mesenteric adenitis she had a prior episode of this several years ago here.  I will treat her with some Augmentin 1 twice a day have her return if she is worse or not any better in a couple days to follow-up with her doctor.  Patient's presentation is most consistent with acute complicated illness / injury requiring diagnostic workup. I will give the patient Augmentin.  She reportedly had a reaction to penicillin when she was a very young child but has since had amoxicillin several times without any problems.  I will also give her a couple Vicodin so she can sleep she says she has not been able to sleep for several nights because of the pain.   FINAL CLINICAL IMPRESSION(S) / ED DIAGNOSES   Final diagnoses:  Periumbilical abdominal pain  Mesenteric adenitis     Rx / DC Orders   ED Discharge Orders          Ordered    amoxicillin-clavulanate (AUGMENTIN) 875-125 MG tablet  2 times daily        04/10/22 1329             Note:  This document was prepared using  Dragon voice recognition software and may include unintentional dictation errors.   Nena Polio, MD 04/10/22 1330

## 2022-04-10 NOTE — Telephone Encounter (Signed)
Pt is needing an appt for pelvic pain/possible ovarian cyst. Please help schedule for pt

## 2022-04-13 NOTE — Telephone Encounter (Signed)
Reached out to pt to schedule requested appt.  Left message for pt to call back to schedule.

## 2022-06-15 ENCOUNTER — Other Ambulatory Visit: Payer: Self-pay

## 2022-06-15 DIAGNOSIS — F331 Major depressive disorder, recurrent, moderate: Secondary | ICD-10-CM

## 2022-06-15 DIAGNOSIS — F319 Bipolar disorder, unspecified: Secondary | ICD-10-CM

## 2022-06-15 MED ORDER — LAMOTRIGINE 100 MG PO TABS: ORAL_TABLET | ORAL | 1 refills | Status: DC

## 2022-06-15 MED ORDER — BUPROPION HCL ER (XL) 150 MG PO TB24: ORAL_TABLET | ORAL | 1 refills | Status: DC

## 2022-06-25 ENCOUNTER — Encounter: Payer: Self-pay | Admitting: Adult Health

## 2022-06-25 ENCOUNTER — Ambulatory Visit (INDEPENDENT_AMBULATORY_CARE_PROVIDER_SITE_OTHER): Payer: 59 | Admitting: Adult Health

## 2022-06-25 DIAGNOSIS — F411 Generalized anxiety disorder: Secondary | ICD-10-CM | POA: Diagnosis not present

## 2022-06-25 DIAGNOSIS — F319 Bipolar disorder, unspecified: Secondary | ICD-10-CM | POA: Diagnosis not present

## 2022-06-25 DIAGNOSIS — G4700 Insomnia, unspecified: Secondary | ICD-10-CM

## 2022-06-25 DIAGNOSIS — F331 Major depressive disorder, recurrent, moderate: Secondary | ICD-10-CM | POA: Diagnosis not present

## 2022-06-25 DIAGNOSIS — F902 Attention-deficit hyperactivity disorder, combined type: Secondary | ICD-10-CM

## 2022-06-25 MED ORDER — AMPHETAMINE-DEXTROAMPHETAMINE 20 MG PO TABS: 20.0000 mg | ORAL_TABLET | Freq: Two times a day (BID) | ORAL | 0 refills | Status: DC

## 2022-06-25 MED ORDER — PROPRANOLOL HCL 10 MG PO TABS: 10.0000 mg | ORAL_TABLET | Freq: Three times a day (TID) | ORAL | 2 refills | Status: DC | PRN

## 2022-06-25 MED ORDER — QUETIAPINE FUMARATE 100 MG PO TABS: ORAL_TABLET | ORAL | 5 refills | Status: DC
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
## 2022-07-23 ENCOUNTER — Ambulatory Visit: Payer: 59 | Admitting: Adult Health

## 2022-08-03 ENCOUNTER — Ambulatory Visit: Payer: 59 | Admitting: Adult Health

## 2022-08-05 ENCOUNTER — Emergency Department: Payer: Managed Care, Other (non HMO)

## 2022-08-05 ENCOUNTER — Emergency Department
Admission: EM | Admit: 2022-08-05 | Discharge: 2022-08-05 | Disposition: A | Discharge status: Home / Self Care | Payer: Managed Care, Other (non HMO) | Attending: Emergency Medicine | Admitting: Emergency Medicine

## 2022-08-05 ENCOUNTER — Other Ambulatory Visit: Payer: Self-pay

## 2022-08-05 ENCOUNTER — Telehealth: Payer: Self-pay

## 2022-08-05 ENCOUNTER — Telehealth: Payer: Managed Care, Other (non HMO) | Admitting: Physician Assistant

## 2022-08-05 DIAGNOSIS — Z3A01 Less than 8 weeks gestation of pregnancy: Secondary | ICD-10-CM | POA: Diagnosis not present

## 2022-08-05 DIAGNOSIS — O26891 Other specified pregnancy related conditions, first trimester: Secondary | ICD-10-CM | POA: Insufficient documentation

## 2022-08-05 DIAGNOSIS — Z3490 Encounter for supervision of normal pregnancy, unspecified, unspecified trimester: Secondary | ICD-10-CM

## 2022-08-05 DIAGNOSIS — R102 Pelvic and perineal pain: Secondary | ICD-10-CM

## 2022-08-05 DIAGNOSIS — R109 Unspecified abdominal pain: Secondary | ICD-10-CM

## 2022-08-05 DIAGNOSIS — R1030 Lower abdominal pain, unspecified: Secondary | ICD-10-CM | POA: Diagnosis not present

## 2022-08-05 LAB — CBC
HCT: 41.7 % (ref 36.0–46.0)
Hemoglobin: 13.5 g/dL (ref 12.0–15.0)
MCH: 28.8 pg (ref 26.0–34.0)
MCHC: 32.4 g/dL (ref 30.0–36.0)
MCV: 89.1 fL (ref 80.0–100.0)
Platelets: 232 10*3/uL (ref 150–400)
RBC: 4.68 MIL/uL (ref 3.87–5.11)
RDW: 12.7 % (ref 11.5–15.5)
WBC: 6.4 10*3/uL (ref 4.0–10.5)
nRBC: 0 % (ref 0.0–0.2)

## 2022-08-05 LAB — POC URINE PREG, ED: Preg Test, Ur: POSITIVE — AB

## 2022-08-05 LAB — HCG, QUANTITATIVE, PREGNANCY: hCG, Beta Chain, Quant, S: 15754 m[IU]/mL — ABNORMAL HIGH (ref <5)

## 2022-08-05 NOTE — ED Triage Notes (Signed)
Pt comes with c/o cramping that started this am. Pt denies any bleeding. Pt state she is about [redacted] weeks pregnant.  Unable to hear any fetal HR

## 2022-08-05 NOTE — Discharge Instructions (Signed)
Your ultrasound shows an early pregnancy in the uterus approximately 5 weeks 5 days.  Follow-up with [facility] early next month as scheduled.  You may take Tylenol as needed for pain.  You can take up to 4000 mg of Tylenol daily total, so typically you could take two 500 mg extra strength tablets ('1000mg'$ ) up to every 6 hours.  Return to the ER for new, worsening, or persistent severe abdominal pain, vaginal bleeding or discharge, vomiting or diarrhea, fever or chills, or any other new or worsening symptoms that concern you.

## 2022-08-05 NOTE — ED Notes (Signed)
Called for pt in lobby with no answer

## 2022-08-05 NOTE — Progress Notes (Signed)
Because of current symptoms and pregnancy,  I feel your condition warrants further evaluation and I recommend that you be seen in a face to face visit with your gynecologist or at one of our [facility] clinics.   NOTE: There will be NO CHARGE for this eVisit   If you are having a true medical emergency please call 911.    *Center for [facility] at [facility]             [address], [place], [place] [address] [phone] (*Take patients with no insurance)  *Center for [employer] [facility] at [place], [place],  [place]  [address] [phone] (*Take patients with no insurance)  Center for [employer] [facility] at [place], [place], [place], [place], [address] [phone]  Center for [facility] at [place], [place], [place], [place], [address] [phone]  Center for [facility] at [facility] [address], [place], [place], [place], [address] [phone]  Center for [facility] at [facility]                                 [place], [place], [place], [address] [phone]  Center for [facility] at [facility]                                    [address] [place], [place], [address] [place] [facility] [place] at [facility] [address] [place], [place], [place], [address]                              [facility] [place], [place], [place], [place], [address] [phone]  Your MyChart E-visit questionnaire answers were reviewed by a board certified advanced clinical practitioner to complete your personal care plan based on your specific symptoms.  Thank you for using e-Visits.

## 2022-08-05 NOTE — ED Provider Notes (Addendum)
[facility] Provider Note    Event Date/Time   First MD Initiated Contact with Patient 08/05/22 1132     (approximate)   History   Abdominal Cramping   HPI  Emily Hernandez is a 26 y.o. female G2P0 with history of ADHD, major depressive disorder, anxiety, and bipolar disorder who presents with lower abdominal crampy pain for the last 2 days, worse overnight and in the morning.  The patient has not taken anything for the pain.  The cramps have now somewhat subsided.  She denies any associated vaginal bleeding or discharge.  She has nausea but no vomiting and also reports that she has had some loose stools.  She denies any fever or chills.  I reviewed the past medical records.  The most recent outpatient encounter was on 11/9 with NP Mozingo from psychiatry for her mental health issues.  She has not yet had a prenatal visit or ultrasound.   Physical Exam   Triage Vital Signs: ED Triage Vitals  Enc Vitals Group     BP 08/05/22 0945 125/75     Pulse Rate 08/05/22 0945 (!) 102     Resp 08/05/22 0945 17     Temp 08/05/22 0945 98.2 F (36.8 C)     Temp src --      SpO2 08/05/22 0945 99 %     Weight --      Height --      Head Circumference --      Peak Flow --      Pain Score 08/05/22 0943 5     Pain Loc --      Pain Edu? --      Excl. in [place]? --     Most recent vital signs: Vitals:   08/05/22 0945  BP: 125/75  Pulse: (!) 102  Resp: 17  Temp: 98.2 F (36.8 C)  SpO2: 99%     General: Awake, no distress.  CV:  Good peripheral perfusion.  Resp:  Normal effort.  Abd:  Soft with mild suprapubic tenderness.  No distention.  Other:  No peripheral edema.   ED Results / Procedures / Treatments   Labs (all labs ordered are listed, but only abnormal results are displayed) Labs Reviewed  HCG, QUANTITATIVE, PREGNANCY - Abnormal; Notable for the following components:      Result Value   hCG, Beta Chain, Quant, S 15,754 (*)    All other  components within normal limits  POC URINE PREG, ED - Abnormal; Notable for the following components:   Preg Test, Ur POSITIVE (*)    All other components within normal limits  CBC     EKG     RADIOLOGY  US OB: I independently viewed and interpreted the images; there is an IUP with gestational sac and yolk sac but no fetal pole visualized.  PROCEDURES:  Critical Care performed: No  Procedures   MEDICATIONS ORDERED IN ED: Medications - No data to display   IMPRESSION / MDM / [place] / ED COURSE  I reviewed the triage vital signs and the nursing notes.  26 year old female G2P0 at approximately 6 weeks from LMP presents with lower abdominal crampy pain over the last 2 days with no bleeding and has had some loose stools.  The patient is well peering and exam is unremarkable.  Lab workup reveals positive hCG but no leukocytosis or anemia.  The patient has no urinary symptoms.  Differential diagnosis includes, but is not limited  to, benign abdominal pain in pregnancy, or less likely threatened miscarriage, gastroenteritis, intestinal cramping.  I do not suspect ectopic pregnancy.  Will obtain ultrasound for further evaluation.  Patient's presentation is most consistent with acute complicated illness / injury requiring diagnostic workup.  ----------------------------------------- 1:00 PM on 08/05/2022 -----------------------------------------  Ultrasound showed an IUP with gestational sac and yolk sac although the patient is too early for the fetal pole to be visualized.  She has minimal discomfort and has not required any medication in the ED.  I counseled her on the results of the workup.  She has OB/GYN follow-up for her initial prenatal visit in the next few weeks.  She is stable for discharge home.  Return precautions provided and she expressed understanding.   FINAL CLINICAL IMPRESSION(S) / ED DIAGNOSES   Final diagnoses:  Abdominal pain during pregnancy in  first trimester     Rx / DC Orders   ED Discharge Orders     None        Note:  This document was prepared using Dragon voice recognition software and may include unintentional dictation errors.        Arta Silence, MD 08/05/22 [phone]

## 2022-08-05 NOTE — Telephone Encounter (Signed)
Pt called triage and said she had a POS UPT on 07/26/22, has an appt with [name] on 08/19/22 for pregnancy confirmation. She started having a lot of cramping three days ago. The cramping is getting worse since then. Severe cramping started around 3 am this morning, denies any vaginal spotting or bleeding. Advised to go to ER to get evaluated for this severe pelvic pain.

## 2022-08-17 NOTE — L&D Delivery Note (Signed)
Date of delivery:  Estimated Date of Delivery: 04/02/23 EGA: [redacted]w[redacted]d  Hospital Course summary: MEIRAV SAS is a 27 y.o. G2P1011 @ [redacted]w[redacted]d who was admitted on 03/31/2023 for IOL secondary to elevated BMI. She received cytotec, cook's catheter, AROM and pitocin. Over the night developed arrest of dilation with a persistent anterior lip. The on call CNM called Dr. Valentino Saxon to come assess patient for possible cesarean section. Dr. Valentino Saxon decided to manually reduce the anterior lip and then began pushing with patient. This CNM arrived for the start of shift, received report and entered the room at 8:24. At that time I took over for Dr. Valentino Saxon who was pushing with the patient. I continued pushing with Daine who made slow and continue progress.  Delivery Note At 9:42 AM a viable female was delivered via Vaginal, Spontaneous (Presentation: Right Occiput Anterior).  APGAR: 5, 9; weight  8lb 12oz.   Placenta status: Spontaneous, Intact.  Cord: 3 vessels with the following complications: shoulder dystocia relieved with McRoberts and suprapubic pressure.   Anesthesia: Epidural Episiotomy: None Lacerations: 2nd degree Suture Repair: 3.0 vicryl Est. Blood Loss (mL): 400  Mom to postpartum.  Baby to Couplet care / Skin to Skin.  Raeford Razor 04/01/2023, 11:44 AM    Delivery Narrative  Pearla Dubonnet was complete at 0745 and began pushing at 17. She delivered a female infant named Theodoro Parma at [phone]. Apgars 5,9. The head was delivering steadily until the nose was delivered and then forward motion ceased. This CNM felt for the anterior shoulder with a finger and it was behind the pubic bone. At this  point we lowered the head of the bed to a flat position and applied suprapubic pressure and then the shoulder was easily delivered. Baby was initially a little stunned and did not immediatley start crying or with a small amount of stimulation. Therefore cord was cut after roughly 15-30sec and baby was taken to  warmer for further evaluation. He was brought back to [facility] for skin to skin at ten minutes of age. Apgars were 5 and 9. No Nuchal cord was noted. Cord blood was obtained. Placenta was delivered at 0952, primarily by maternal efforts and with some slight cord traction. Placenta was intact, Shultz mechanism, 3 VC. Perineum inspected and found to have a second degree laceration with extensions to both labia. Repaired with 3-0 vicryl in the usual fashion with epidural for anesthesia. Well tolerated by patient. AMSTL with IV pitocin per unit protocol. EBL 400. Mother and baby stable.

## 2022-08-19 ENCOUNTER — Ambulatory Visit: Payer: Managed Care, Other (non HMO)

## 2022-08-24 ENCOUNTER — Ambulatory Visit: Payer: Managed Care, Other (non HMO)

## 2022-08-24 VITALS — Wt 180.0 lb

## 2022-08-24 DIAGNOSIS — Z369 Encounter for antenatal screening, unspecified: Secondary | ICD-10-CM

## 2022-08-24 DIAGNOSIS — Z3689 Encounter for other specified antenatal screening: Secondary | ICD-10-CM

## 2022-08-24 DIAGNOSIS — Z3480 Encounter for supervision of other normal pregnancy, unspecified trimester: Secondary | ICD-10-CM

## 2022-08-24 DIAGNOSIS — O0990 Supervision of high risk pregnancy, unspecified, unspecified trimester: Secondary | ICD-10-CM | POA: Insufficient documentation

## 2022-08-24 NOTE — Patient Instructions (Signed)
First Trimester of Pregnancy  The first trimester of pregnancy starts on the first day of your last menstrual period until the end of week 12. This is also called months 1 through 3 of pregnancy. Body changes during your first trimester Your body goes through many changes during pregnancy. The changes usually return to normal after your baby is born. Physical changes You may gain or lose weight. Your breasts may grow larger and hurt. The area around your nipples may get darker. Dark spots or blotches may develop on your face. You may have changes in your hair. Health changes You may feel like you might vomit (nauseous), and you may vomit. You may have heartburn. You may have headaches. You may have trouble pooping (constipation). Your gums may bleed. Other changes You may get tired easily. You may pee (urinate) more often. Your menstrual periods will stop. You may not feel hungry. You may want to eat certain kinds of food. You may have changes in your emotions from day to day. You may have more dreams. Follow these instructions at home: Medicines Take over-the-counter and prescription medicines only as told by your doctor. Some medicines are not safe during pregnancy. Take a prenatal vitamin that contains at least 600 micrograms (mcg) of folic acid. Eating and drinking Eat healthy meals that include: Fresh fruits and vegetables. Whole grains. Good sources of protein, such as meat, eggs, or tofu. Low-fat dairy products. Avoid raw meat and unpasteurized juice, milk, and cheese. If you feel like you may vomit, or you vomit: Eat 4 or 5 small meals a day instead of 3 large meals. Try eating a few soda crackers. Drink liquids between meals instead of during meals. You may need to take these actions to prevent or treat trouble pooping: Drink enough fluids to keep your pee (urine) pale yellow. Eat foods that are high in fiber. These include beans, whole grains, and fresh fruits and  vegetables. Limit foods that are high in fat and sugar. These include fried or sweet foods. Activity Exercise only as told by your doctor. Most people can do their usual exercise routine during pregnancy. Stop exercising if you have cramps or pain in your lower belly (abdomen) or low back. Do not exercise if it is too hot or too humid, or if you are in a place of great height (high altitude). Avoid heavy lifting. If you choose to, you may have sex unless your doctor tells you not to. Relieving pain and discomfort Wear a good support bra if your breasts are sore. Rest with your legs raised (elevated) if you have leg cramps or low back pain. If you have bulging veins (varicose veins) in your legs: Wear support hose as told by your doctor. Raise your feet for 15 minutes, 3-4 times a day. Limit salt in your food. Safety Wear your seat belt at all times when you are in a car. Talk with your doctor if someone is hurting you or yelling at you. Talk with your doctor if you are feeling sad or have thoughts of hurting yourself. Lifestyle Do not use hot tubs, steam rooms, or saunas. Do not douche. Do not use tampons or scented sanitary pads. Do not use herbal medicines, illegal drugs, or medicines that are not approved by your doctor. Do not drink alcohol. Do not smoke or use any products that contain nicotine or tobacco. If you need help quitting, ask your doctor. Avoid cat litter boxes and soil that is used by cats. These carry   germs that can cause harm to the baby and can cause a loss of your baby by miscarriage or stillbirth. General instructions Keep all follow-up visits. This is important. Ask for help if you need counseling or if you need help with nutrition. Your doctor can give you advice or tell you where to go for help. Visit your dentist. At home, brush your teeth with a soft toothbrush. Floss gently. Write down your questions. Take them to your prenatal visits. Where to find more  information American Pregnancy Association: americanpregnancy.org American College of Obstetricians and Gynecologists: www.acog.org Office on Women's Health: womenshealth.gov/pregnancy Contact a doctor if: You are dizzy. You have a fever. You have mild cramps or pressure in your lower belly. You have a nagging pain in your belly area. You continue to feel like you may vomit, you vomit, or you have watery poop (diarrhea) for 24 hours or longer. You have a bad-smelling fluid coming from your vagina. You have pain when you pee. You are exposed to a disease that spreads from person to person, such as chickenpox, measles, Zika virus, HIV, or hepatitis. Get help right away if: You have spotting or bleeding from your vagina. You have very bad belly cramping or pain. You have shortness of breath or chest pain. You have any kind of injury, such as from a fall or a car crash. You have new or increased pain, swelling, or redness in an arm or leg. Summary The first trimester of pregnancy starts on the first day of your last menstrual period until the end of week 12 (months 1 through 3). Eat 4 or 5 small meals a day instead of 3 large meals. Do not smoke or use any products that contain nicotine or tobacco. If you need help quitting, ask your doctor. Keep all follow-up visits. This information is not intended to replace advice given to you by your health care provider. Make sure you discuss any questions you have with your health care provider. Document Revised: 01/10/2020 Document Reviewed: 11/16/2019 Elsevier Patient Education  2023 Elsevier Inc. Commonly Asked Questions During Pregnancy  Cats: A parasite can be excreted in cat feces.  To avoid exposure you need to have another person empty the little box.  If you must empty the litter box you will need to wear gloves.  Wash your hands after handling your cat.  This parasite can also be found in raw or undercooked meat so this should also be  avoided.  Colds, Sore Throats, Flu: Please check your medication sheet to see what you can take for symptoms.  If your symptoms are unrelieved by these medications please call the office.  Dental Work: Most any dental work your dentist recommends is permitted.  X-rays should only be taken during the first trimester if absolutely necessary.  Your abdomen should be shielded with a lead apron during all x-rays.  Please notify your provider prior to receiving any x-rays.  Novocaine is fine; gas is not recommended.  If your dentist requires a note from us prior to dental work please call the office and we will provide one for you.  Exercise: Exercise is an important part of staying healthy during your pregnancy.  You may continue most exercises you were accustomed to prior to pregnancy.  Later in your pregnancy you will most likely notice you have difficulty with activities requiring balance like riding a bicycle.  It is important that you listen to your body and avoid activities that put you at a higher   risk of falling.  Adequate rest and staying well hydrated are a must!  If you have questions about the safety of specific activities ask your provider.    Exposure to Children with illness: Try to avoid obvious exposure; report any symptoms to us when noted,  If you have chicken pos, red measles or mumps, you should be immune to these diseases.   Please do not take any vaccines while pregnant unless you have checked with your OB provider.  Fetal Movement: After 28 weeks we recommend you do "kick counts" twice daily.  Lie or sit down in a calm quiet environment and count your baby movements "kicks".  You should feel your baby at least 10 times per hour.  If you have not felt 10 kicks within the first hour get up, walk around and have something sweet to eat or drink then repeat for an additional hour.  If count remains less than 10 per hour notify your provider.  Fumigating: Follow your pest control agent's  advice as to how long to stay out of your home.  Ventilate the area well before re-entering.  Hemorrhoids:   Most over-the-counter preparations can be used during pregnancy.  Check your medication to see what is safe to use.  It is important to use a stool softener or fiber in your diet and to drink lots of liquids.  If hemorrhoids seem to be getting worse please call the office.   Hot Tubs:  Hot tubs Jacuzzis and saunas are not recommended while pregnant.  These increase your internal body temperature and should be avoided.  Intercourse:  Sexual intercourse is safe during pregnancy as long as you are comfortable, unless otherwise advised by your provider.  Spotting may occur after intercourse; report any bright red bleeding that is heavier than spotting.  Labor:  If you know that you are in labor, please go to the hospital.  If you are unsure, please call the office and let us help you decide what to do.  Lifting, straining, etc:  If your job requires heavy lifting or straining please check with your provider for any limitations.  Generally, you should not lift items heavier than that you can lift simply with your hands and arms (no back muscles)  Painting:  Paint fumes do not harm your pregnancy, but may make you ill and should be avoided if possible.  Latex or water based paints have less odor than oils.  Use adequate ventilation while painting.  Permanents & Hair Color:  Chemicals in hair dyes are not recommended as they cause increase hair dryness which can increase hair loss during pregnancy.  " Highlighting" and permanents are allowed.  Dye may be absorbed differently and permanents may not hold as well during pregnancy.  Sunbathing:  Use a sunscreen, as skin burns easily during pregnancy.  Drink plenty of fluids; avoid over heating.  Tanning Beds:  Because their possible side effects are still unknown, tanning beds are not recommended.  Ultrasound Scans:  Routine ultrasounds are performed  at approximately 20 weeks.  You will be able to see your baby's general anatomy an if you would like to know the gender this can usually be determined as well.  If it is questionable when you conceived you may also receive an ultrasound early in your pregnancy for dating purposes.  Otherwise ultrasound exams are not routinely performed unless there is a medical necessity.  Although you can request a scan we ask that you pay for it when   conducted because insurance does not cover " patient request" scans.  Work: If your pregnancy proceeds without complications you may work until your due date, unless your physician or employer advises otherwise.  Round Ligament Pain/Pelvic Discomfort:  Sharp, shooting pains not associated with bleeding are fairly common, usually occurring in the second trimester of pregnancy.  They tend to be worse when standing up or when you remain standing for long periods of time.  These are the result of pressure of certain pelvic ligaments called "round ligaments".  Rest, Tylenol and heat seem to be the most effective relief.  As the womb and fetus grow, they rise out of the pelvis and the discomfort improves.  Please notify the office if your pain seems different than that described.  It may represent a more serious condition.  Common Medications Safe in Pregnancy  Acne:      Constipation:  Benzoyl Peroxide     Colace  Clindamycin      Dulcolax Suppository  Topica Erythromycin     Fibercon  Salicylic Acid      Metamucil         Miralax AVOID:        Senakot   Accutane    Cough:  Retin-A       Cough Drops  Tetracycline      Phenergan w/ Codeine if Rx  Minocycline      Robitussin (Plain & DM)  Antibiotics:     Crabs/Lice:  Ceclor       RID  Cephalosporins    AVOID:  E-Mycins      Kwell  Keflex  Macrobid/Macrodantin   Diarrhea:  Penicillin      Kao-Pectate  Zithromax      Imodium AD         PUSH FLUIDS AVOID:       Cipro     Fever:  Tetracycline      Tylenol (Regular  or Extra  Minocycline       Strength)  Levaquin      Extra Strength-Do not          Exceed 8 tabs/24 hrs Caffeine:        <200mg/day (equiv. To 1 cup of coffee or  approx. 3 12 oz sodas)         Gas: Cold/Hayfever:       Gas-X  Benadryl      Mylicon  Claritin       Phazyme  **Claritin-D        Chlor-Trimeton    Headaches:  Dimetapp      ASA-Free Excedrin  Drixoral-Non-Drowsy     Cold Compress  Mucinex (Guaifenasin)     Tylenol (Regular or Extra  Sudafed/Sudafed-12 Hour     Strength)  **Sudafed PE Pseudoephedrine   Tylenol Cold & Sinus     Vicks Vapor Rub  Zyrtec  **AVOID if Problems With Blood Pressure         Heartburn: Avoid lying down for at least 1 hour after meals  Aciphex      Maalox     Rash:  Milk of Magnesia     Benadryl    Mylanta       1% Hydrocortisone Cream  Pepcid  Pepcid Complete   Sleep Aids:  Prevacid      Ambien   Prilosec       Benadryl  Rolaids       Chamomile Tea  Tums (Limit 4/day)     Unisom           Tylenol PM         Warm milk-add vanilla or  Hemorrhoids:       Sugar for taste  Anusol/Anusol H.C.  (RX: Analapram 2.5%)  Sugar Substitutes:  Hydrocortisone OTC     Ok in moderation  Preparation H      Tucks        Vaseline lotion applied to tissue with wiping    Herpes:     Throat:  Acyclovir      Oragel  Famvir  Valtrex     Vaccines:         Flu Shot Leg Cramps:       *Gardasil  Benadryl      Hepatitis A         Hepatitis B Nasal Spray:       Pneumovax  Saline Nasal Spray     Polio Booster         Tetanus Nausea:       Tuberculosis test or PPD  Vitamin B6 25 mg TID   AVOID:    Dramamine      *Gardasil  Emetrol       Live Poliovirus  Ginger Root 250 mg QID    MMR (measles, mumps &  High Complex Carbs @ Bedtime    rebella)  Sea Bands-Accupressure    Varicella (Chickenpox)  Unisom 1/2 tab TID     *No known complications           If received before Pain:         Known pregnancy;   Darvocet       Resume series  after  Lortab        Delivery  Percocet    Yeast:   Tramadol      Femstat  Tylenol 3      Gyne-lotrimin  Ultram       Monistat  Vicodin           MISC:         All Sunscreens           Hair Coloring/highlights          Insect Repellant's          (Including DEET)         Mystic Tans  

## 2022-08-24 NOTE — Progress Notes (Signed)
New OB Intake  I connected with  Emily Hernandez on 08/24/22 at  9:15 AM EST by telephone Video Visit and verified that I am speaking with the correct person using two identifiers. Nurse is located at [employer] and pt is located at home.  I explained I am completing New OB Intake today. We discussed her EDD of 04/02/2023 that is based on U/S of 08/05/2022 - [redacted]w[redacted]d Pt is G2/P0010. I reviewed her allergies, medications, Medical/Surgical/OB history, and appropriate screenings. There are cats in the home  yes If yes Indoor Based on history, this is a/an pregnancy uncomplicated .   Patient Active Problem List   Diagnosis Date Noted   Supervision of other normal pregnancy, antepartum 08/24/2022   Hypertrophy of breast    Idiopathic intracranial hypertension 02/20/2014    Concerns addressed today Would like another dating scan in hopes of seeing more and making sure baby is alive.  Pt had covid last week.  Delivery Plans:  Plans to deliver at [facility].  Anatomy [place] first scheduled [place] and an anatomy scan will be done at 20 weeks.  Labs Discussed genetic screening with patient. Patient desires genetic testing to be drawn with new OB labs. Discussed possible labs to be drawn at new OB appointment.  COVID Vaccine Patient has not had COVID vaccine.   Social Determinants of Health Food Insecurity: denies food insecurity Transportation: Patient denies transportation needs.   First visit review I reviewed new OB appt with pt. I explained she will have ob bloodwork and pap smear/pelvic exam if indicated. Explained pt will be seen by Dr. MClovia Cuffat first visit; encounter routed to appropriate provider.   RCleophas Dunker [facility]1/03/2023  9:43 AM

## 2022-09-10 ENCOUNTER — Other Ambulatory Visit: Payer: Managed Care, Other (non HMO)

## 2022-09-14 ENCOUNTER — Other Ambulatory Visit (HOSPITAL_COMMUNITY)
Admission: RE | Admit: 2022-09-14 | Discharge: 2022-09-14 | Disposition: A | Discharge status: Home / Self Care | Payer: Managed Care, Other (non HMO) | Source: Ambulatory Visit | Attending: Obstetrics & Gynecology | Admitting: Obstetrics & Gynecology

## 2022-09-14 ENCOUNTER — Other Ambulatory Visit: Payer: Managed Care, Other (non HMO)

## 2022-09-14 DIAGNOSIS — Z3480 Encounter for supervision of other normal pregnancy, unspecified trimester: Secondary | ICD-10-CM

## 2022-09-14 DIAGNOSIS — Z369 Encounter for antenatal screening, unspecified: Secondary | ICD-10-CM

## 2022-09-15 LAB — CBC/D/PLT+RPR+RH+ABO+RUBIGG...
Antibody Screen: NEGATIVE
Basophils Absolute: 0 10*3/uL (ref 0.0–0.2)
Basos: 0 %
EOS (ABSOLUTE): 0.1 10*3/uL (ref 0.0–0.4)
Eos: 1 %
HCV Ab: NONREACTIVE
HIV Screen 4th Generation wRfx: NONREACTIVE
Hematocrit: 40.4 % (ref 34.0–46.6)
Hemoglobin: 13.4 g/dL (ref 11.1–15.9)
Hepatitis B Surface Ag: NEGATIVE
Immature Grans (Abs): 0 10*3/uL (ref 0.0–0.1)
Immature Granulocytes: 0 %
Lymphocytes Absolute: 1.3 10*3/uL (ref 0.7–3.1)
Lymphs: 23 %
MCH: 29.3 pg (ref 26.6–33.0)
MCHC: 33.2 g/dL (ref 31.5–35.7)
MCV: 88 fL (ref 79–97)
Monocytes Absolute: 0.2 10*3/uL (ref 0.1–0.9)
Monocytes: 4 %
Neutrophils Absolute: 3.8 10*3/uL (ref 1.4–7.0)
Neutrophils: 72 %
Platelets: 219 10*3/uL (ref 150–450)
RBC: 4.57 x10E6/uL (ref 3.77–5.28)
RDW: 12.6 % (ref 11.7–15.4)
RPR Ser Ql: NONREACTIVE
Rubella Antibodies, IGG: 2.88 index (ref >0.99)
Varicella zoster IgG: 689 index (ref >165)
WBC: 5.3 10*3/uL (ref 3.4–10.8)

## 2022-09-15 LAB — URINALYSIS, ROUTINE W REFLEX MICROSCOPIC
Bilirubin, UA: NEGATIVE
Glucose, UA: NEGATIVE
Ketones, UA: NEGATIVE
Leukocytes,UA: NEGATIVE
Nitrite, UA: NEGATIVE
Protein,UA: NEGATIVE
RBC, UA: NEGATIVE
Specific Gravity, UA: 1.017 (ref 1.005–1.030)
Urobilinogen, Ur: 0.2 mg/dL (ref 0.2–1.0)
pH, UA: 8 — ABNORMAL HIGH (ref 5.0–7.5)

## 2022-09-15 LAB — URINE CYTOLOGY ANCILLARY ONLY
Chlamydia: NEGATIVE
Comment: NEGATIVE
Comment: NORMAL
Neisseria Gonorrhea: NEGATIVE

## 2022-09-15 LAB — HCV INTERPRETATION

## 2022-09-16 ENCOUNTER — Ambulatory Visit: Payer: Managed Care, Other (non HMO)

## 2022-09-16 LAB — MONITOR DRUG PROFILE 14(MW)
Amphetamine Scrn, Ur: NEGATIVE ng/mL
BARBITURATE SCREEN URINE: NEGATIVE ng/mL
BENZODIAZEPINE SCREEN, URINE: NEGATIVE ng/mL
Buprenorphine, Urine: NEGATIVE ng/mL
CANNABINOIDS UR QL SCN: NEGATIVE ng/mL
Cocaine (Metab) Scrn, Ur: NEGATIVE ng/mL
Creatinine(Crt), U: 76 mg/dL (ref 20.0–300.0)
Fentanyl, Urine: NEGATIVE pg/mL
Meperidine Screen, Urine: NEGATIVE ng/mL
Methadone Screen, Urine: NEGATIVE ng/mL
OXYCODONE+OXYMORPHONE UR QL SCN: NEGATIVE ng/mL
Opiate Scrn, Ur: NEGATIVE ng/mL
Ph of Urine: 8.1 (ref 4.5–8.9)
Phencyclidine Qn, Ur: NEGATIVE ng/mL
Propoxyphene Scrn, Ur: NEGATIVE ng/mL
SPECIFIC GRAVITY: 1.02
Tramadol Screen, Urine: NEGATIVE ng/mL

## 2022-09-16 LAB — NICOTINE SCREEN, URINE: Cotinine Ql Scrn, Ur: NEGATIVE ng/mL

## 2022-09-16 LAB — CULTURE, OB URINE

## 2022-09-16 LAB — URINE CULTURE, OB REFLEX

## 2022-09-20 LAB — MATERNIT 21 PLUS CORE, BLOOD
Fetal Fraction: 8
Result (T21): NEGATIVE
Trisomy 13 (Patau syndrome): NEGATIVE
Trisomy 18 (Edwards syndrome): NEGATIVE
Trisomy 21 (Down syndrome): NEGATIVE

## 2022-09-21 ENCOUNTER — Encounter: Payer: Self-pay | Admitting: Obstetrics & Gynecology

## 2022-09-21 ENCOUNTER — Ambulatory Visit (INDEPENDENT_AMBULATORY_CARE_PROVIDER_SITE_OTHER): Payer: Managed Care, Other (non HMO) | Admitting: Obstetrics & Gynecology

## 2022-09-21 VITALS — BP 120/80 | Wt 194.0 lb

## 2022-09-21 DIAGNOSIS — Z3A12 12 weeks gestation of pregnancy: Secondary | ICD-10-CM | POA: Diagnosis not present

## 2022-09-21 DIAGNOSIS — R87612 Low grade squamous intraepithelial lesion on cytologic smear of cervix (LGSIL): Secondary | ICD-10-CM

## 2022-09-21 DIAGNOSIS — Z3480 Encounter for supervision of other normal pregnancy, unspecified trimester: Secondary | ICD-10-CM

## 2022-09-21 DIAGNOSIS — O99211 Obesity complicating pregnancy, first trimester: Secondary | ICD-10-CM | POA: Diagnosis not present

## 2022-09-21 DIAGNOSIS — E669 Obesity, unspecified: Secondary | ICD-10-CM | POA: Diagnosis not present

## 2022-09-21 DIAGNOSIS — Z2913 Encounter for prophylactic Rho(D) immune globulin: Secondary | ICD-10-CM

## 2022-09-21 DIAGNOSIS — O99210 Obesity complicating pregnancy, unspecified trimester: Secondary | ICD-10-CM

## 2022-09-21 LAB — POCT URINALYSIS DIPSTICK OB
Bilirubin, UA: NEGATIVE
Blood, UA: NEGATIVE
Glucose, UA: NEGATIVE
Ketones, UA: NEGATIVE
Leukocytes, UA: NEGATIVE
Nitrite, UA: NEGATIVE
POC,PROTEIN,UA: NEGATIVE
Spec Grav, UA: 1.015 (ref 1.010–1.025)
Urobilinogen, UA: 1 E.U./dL
pH, UA: 6.5 (ref 5.0–8.0)

## 2022-09-21 NOTE — Progress Notes (Signed)
Subjective:    Emily Hernandez is a G2P0010 79w3dbeing seen today for her first obstetrical visit.  Her obstetrical history is significant for obesity. Patient does not intend to breast feed. Pregnancy history fully reviewed.  Patient reports no complaints.  Vitals:   09/21/22 0943  BP: 120/80  Weight: 194 lb (88 kg)    HISTORY: OB History  Gravida Para Term Preterm AB Living  2       1    SAB IAB Ectopic Multiple Live Births    1          # Outcome Date GA Lbr Len/2nd Weight Sex Delivery Anes PTL Lv  2 Current           1 IAB 03/2018     TAB       Obstetric Comments  Pt had elective abortion, date unknown.   Past Medical History:  Diagnosis Date   Bipolar disorder ([place]    Cold sore    Depression    Dysplastic nevus 10/15/2020   Left upper back, severe. Exc 02/03/21 2:30pm.   Idiopathic intracranial hypertension    Vaccine for human papilloma virus (HPV) types 6, 11, 16, and 18 administered    Past Surgical History:  Procedure Laterality Date   BREAST REDUCTION SURGERY Bilateral 01/13/2022   Procedure: MAMMARY REDUCTION  (BREAST);  Surgeon: LLennice Sites MD;  [facility]  Service: Plastics;  Laterality: Bilateral;   none     WISDOM TOOTH EXTRACTION     Family History  Problem Relation Age of Onset   Healthy Mother    Healthy Father    Heart attack Maternal Grandmother    GI Bleed Maternal Grandfather    Cancer Paternal Grandmother    Healthy Half-Sister    Healthy Half-Sister    Healthy Half-Brother    Breast cancer Neg Hx    Ovarian cancer Neg Hx    Colon cancer Neg Hx    Diabetes Neg Hx      Exam    Uterus:     Pelvic Exam:    Perineum: No Hemorrhoids   Vulva: normal   Vagina:  normal mucosa   pH:    Cervix: anteverted   Adnexa: normal adnexa   Bony Pelvis: android  System: Breast:  normal appearance, no masses or tenderness   Skin: normal coloration and turgor, no rashes    Neurologic: oriented   Extremities: normal strength,  tone, and muscle mass   HEENT PERRLA   Mouth/Teeth mucous membranes moist, pharynx normal without lesions   Neck supple   Cardiovascular: regular rate and rhythm   Respiratory:  appears well, vitals normal, no respiratory distress, acyanotic, normal RR, ear and throat exam is normal, neck free of mass or lymphadenopathy, chest clear, no wheezing, crepitations, rhonchi, normal symmetric air entry   Abdomen: soft, non-tender; bowel sounds normal; no masses,  no organomegaly   Urinary: urethral meatus normal   FHR- 150s     Assessment:    Pregnancy: G2P0010 Patient Active Problem List   Diagnosis Date Noted   Obesity in pregnancy 09/21/2022   Need for rhogam due to Rh negative mother 09/21/2022   Supervision of other normal pregnancy, antepartum 08/24/2022   Hypertrophy of breast    Idiopathic intracranial hypertension 02/20/2014        Plan:     Initial labs drawn. Prenatal vitamins. Problem list reviewed and updated. Mat 21 results - low risk .  Ultrasound discussed; fetal survey: ordered.  Follow up in 4 weeks. A1C today, 15 pound weight gain rec'd (she is already 20 pounds up). She declines nutritionist referral.  Emily Filbert 09/21/2022

## 2022-09-22 ENCOUNTER — Other Ambulatory Visit: Payer: Self-pay | Admitting: Adult Health

## 2022-09-22 DIAGNOSIS — F411 Generalized anxiety disorder: Secondary | ICD-10-CM

## 2022-09-22 LAB — HEMOGLOBIN A1C
Est. average glucose Bld gHb Est-mCnc: 103 mg/dL
Hgb A1c MFr Bld: 5.2 % (ref 4.8–5.6)

## 2022-09-24 ENCOUNTER — Other Ambulatory Visit: Payer: Self-pay

## 2022-09-24 DIAGNOSIS — Z3490 Encounter for supervision of normal pregnancy, unspecified, unspecified trimester: Secondary | ICD-10-CM

## 2022-09-28 ENCOUNTER — Ambulatory Visit
Admission: RE | Admit: 2022-09-28 | Discharge: 2022-09-28 | Disposition: A | Discharge status: Home / Self Care | Payer: Managed Care, Other (non HMO) | Source: Ambulatory Visit | Attending: Obstetrics & Gynecology | Admitting: Obstetrics & Gynecology

## 2022-09-28 DIAGNOSIS — Z3A13 13 weeks gestation of pregnancy: Secondary | ICD-10-CM | POA: Diagnosis not present

## 2022-09-28 DIAGNOSIS — Z3490 Encounter for supervision of normal pregnancy, unspecified, unspecified trimester: Secondary | ICD-10-CM | POA: Diagnosis present

## 2022-09-28 DIAGNOSIS — Z3491 Encounter for supervision of normal pregnancy, unspecified, first trimester: Secondary | ICD-10-CM | POA: Insufficient documentation

## 2022-10-20 ENCOUNTER — Ambulatory Visit (INDEPENDENT_AMBULATORY_CARE_PROVIDER_SITE_OTHER): Payer: Managed Care, Other (non HMO) | Admitting: Obstetrics and Gynecology

## 2022-10-20 ENCOUNTER — Encounter: Payer: Self-pay | Admitting: Obstetrics and Gynecology

## 2022-10-20 VITALS — BP 117/77 | HR 89 | Wt 201.0 lb

## 2022-10-20 DIAGNOSIS — F319 Bipolar disorder, unspecified: Secondary | ICD-10-CM

## 2022-10-20 DIAGNOSIS — O99352 Diseases of the nervous system complicating pregnancy, second trimester: Secondary | ICD-10-CM

## 2022-10-20 DIAGNOSIS — O0992 Supervision of high risk pregnancy, unspecified, second trimester: Secondary | ICD-10-CM

## 2022-10-20 DIAGNOSIS — Z8669 Personal history of other diseases of the nervous system and sense organs: Secondary | ICD-10-CM

## 2022-10-20 DIAGNOSIS — G43909 Migraine, unspecified, not intractable, without status migrainosus: Secondary | ICD-10-CM

## 2022-10-20 DIAGNOSIS — O26892 Other specified pregnancy related conditions, second trimester: Secondary | ICD-10-CM

## 2022-10-20 DIAGNOSIS — Z3481 Encounter for supervision of other normal pregnancy, first trimester: Secondary | ICD-10-CM

## 2022-10-20 DIAGNOSIS — Z2913 Encounter for prophylactic Rho(D) immune globulin: Secondary | ICD-10-CM

## 2022-10-20 DIAGNOSIS — Z3A16 16 weeks gestation of pregnancy: Secondary | ICD-10-CM

## 2022-10-20 DIAGNOSIS — F988 Other specified behavioral and emotional disorders with onset usually occurring in childhood and adolescence: Secondary | ICD-10-CM | POA: Insufficient documentation

## 2022-10-20 DIAGNOSIS — Z3689 Encounter for other specified antenatal screening: Secondary | ICD-10-CM

## 2022-10-20 DIAGNOSIS — Z1379 Encounter for other screening for genetic and chromosomal anomalies: Secondary | ICD-10-CM

## 2022-10-20 DIAGNOSIS — O99342 Other mental disorders complicating pregnancy, second trimester: Secondary | ICD-10-CM

## 2022-10-20 LAB — POCT URINALYSIS DIPSTICK OB
Bilirubin, UA: NEGATIVE
Blood, UA: NEGATIVE
Glucose, UA: NEGATIVE
Ketones, UA: NEGATIVE
Leukocytes, UA: NEGATIVE
Nitrite, UA: NEGATIVE
POC,PROTEIN,UA: NEGATIVE
Spec Grav, UA: 1.015 (ref 1.010–1.025)
Urobilinogen, UA: 0.2 E.U./dL
pH, UA: 7 (ref 5.0–8.0)

## 2022-10-20 MED ORDER — BUTALBITAL-APAP-CAFFEINE 50-325-40 MG PO CAPS: 1.0000 | ORAL_CAPSULE | Freq: Four times a day (QID) | ORAL | 3 refills | Status: DC | PRN

## 2022-10-20 NOTE — Progress Notes (Signed)
ROB: Patient is a 27 y.o. G2P0010 at 63w4dwho presents for routine care. Complains of headaches daily without relief from Tylenol.  Has h/o migraines.  Discussed use of occasional Execedrin, Magnesium, and can give prescription for Fiorcet if no relief from OTC options. Also notes congestion, unsure of what to take. Advised on safe medication list options. Additionally,  has issues with ADD/mood disorder (anxiety, stopped all medications when she discovered she was pregnant.  Is starting to feel symptomatic.  Previously was on Wellbutrin, Lamictal, Topamax, and Adderall.  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 Would not recommend Adderall unless benefits outweigh risks, and cannot utilize Topamax at this time. Would recommend fetal surveillance with growth scan in 3rd trimester if medications resumed. Advised to f/u with her Psychiatrist with recommendations.    Discussed Rh status (weak D).  Will still need Rhogam. Reviewed recommendations with patient. Discussed carrier screening and AFP today, patient ok to perform. Normal MaterniT21 reviewed. RTC in 4 weeks, anatomy scan ordered.

## 2022-10-20 NOTE — Patient Instructions (Signed)

## 2022-10-21 ENCOUNTER — Telehealth: Payer: Self-pay

## 2022-10-21 MED ORDER — BUTALBITAL-APAP-CAFFEINE 50-325-40 MG PO CAPS: 1.0000 | ORAL_CAPSULE | Freq: Four times a day (QID) | ORAL | 3 refills | Status: DC | PRN

## 2022-10-21 NOTE — Addendum Note (Signed)
Addended by: Augusto Gamble on: 10/21/2022 05:27 PM   Modules accepted: Orders

## 2022-10-21 NOTE — Telephone Encounter (Signed)
Patient contacted office stating that she was seen by Dr. Marcelline Mates yesterday and had complained of frequent headaches at visit, patient states that she was advised prescription would be sent to pharmacy and when she went to pharmacy there was no prescription. I see in chart that there was a prescription for Butalbital-APAP- Caffeine but it was printed and not sent to pharmacy. Will send in prescription today for patient. KW

## 2022-10-21 NOTE — Telephone Encounter (Signed)
Sorry about that. Didn't realize that it printed. I resubmitted it.

## 2022-10-21 NOTE — Telephone Encounter (Signed)
Dr. Marcelline Mates, I tried sending medication to pharmacy but it printed out instead of escribe can you please review.KW

## 2022-10-21 NOTE — Addendum Note (Signed)
Addended by: Augusto Gamble on: 10/21/2022 10:08 AM   Modules accepted: Orders

## 2022-10-23 ENCOUNTER — Other Ambulatory Visit: Payer: Managed Care, Other (non HMO)

## 2022-10-23 LAB — AFP, SERUM, OPEN SPINA BIFIDA
AFP MoM: 1.15
AFP Value: 35.1 ng/mL
Gest. Age on Collection Date: 16.6 weeks
Maternal Age At EDD: 28.7 yr
OSBR Risk 1 IN: 7603
Test Results:: NEGATIVE
Weight: 201 [lb_av]

## 2022-10-29 ENCOUNTER — Telehealth: Payer: Self-pay

## 2022-10-29 NOTE — Telephone Encounter (Signed)
Miranda from [employer] calling for verification of panorama test panel and pt's EDD.  Ref # [id number].  [phone]

## 2022-11-05 NOTE — Telephone Encounter (Signed)
Spoke w/Allison from [employer]. She states this has already been resolved. They received all tubes. Only purple tops needed for horizon test when a Panorama/Horizon combo kit is sent.

## 2022-11-05 NOTE — Telephone Encounter (Signed)
Dr. Marcelline Mates is requesting a pan-ethic panel. Claiborne Billings contacted [employer] and patient will have to be redrawn. Waiting on response from Dr. Marcelline Mates about redraw.

## 2022-11-06 ENCOUNTER — Ambulatory Visit
Admission: RE | Admit: 2022-11-06 | Discharge: 2022-11-06 | Disposition: A | Discharge status: Home / Self Care | Payer: Managed Care, Other (non HMO) | Source: Ambulatory Visit | Attending: Obstetrics and Gynecology | Admitting: Obstetrics and Gynecology

## 2022-11-06 DIAGNOSIS — Z3689 Encounter for other specified antenatal screening: Secondary | ICD-10-CM | POA: Diagnosis present

## 2022-11-17 ENCOUNTER — Ambulatory Visit (INDEPENDENT_AMBULATORY_CARE_PROVIDER_SITE_OTHER): Payer: Medicaid Other | Admitting: Obstetrics

## 2022-11-17 VITALS — BP 103/61 | HR 81 | Wt 204.3 lb

## 2022-11-17 DIAGNOSIS — O0992 Supervision of high risk pregnancy, unspecified, second trimester: Secondary | ICD-10-CM

## 2022-11-17 DIAGNOSIS — Z3A19 19 weeks gestation of pregnancy: Secondary | ICD-10-CM

## 2022-11-17 LAB — POCT URINALYSIS DIPSTICK OB
Bilirubin, UA: NEGATIVE
Blood, UA: NEGATIVE
Glucose, UA: NEGATIVE
Ketones, UA: NEGATIVE
Leukocytes, UA: NEGATIVE
Nitrite, UA: NEGATIVE
POC,PROTEIN,UA: NEGATIVE
Spec Grav, UA: 1.015 (ref 1.010–1.025)
Urobilinogen, UA: 0.2 E.U./dL
pH, UA: 5 (ref 5.0–8.0)

## 2022-11-17 NOTE — Progress Notes (Signed)
Routine Prenatal Care Visit  Subjective  Emily Hernandez is a 27 y.o. G2P0010 at [redacted]w[redacted]d being seen today for ongoing prenatal care.  She is currently monitored for the following issues for this high-risk pregnancy and has Idiopathic intracranial hypertension; Hypertrophy of breast; Pregnancy, supervision, high-risk; Obesity in pregnancy; Need for rhogam due to Rh negative mother; ADD (attention deficit disorder); Bipolar disorder; and History of migraine on their problem list.  ----------------------------------------------------------------------------------- Patient reports no complaints.   Contractions: Not present. Vag. Bleeding: None.  Movement: Present. Leaking Fluid denies.  ----------------------------------------------------------------------------------- The following portions of the patient's history were reviewed and updated as appropriate: allergies, current medications, past family history, past medical history, past social history, past surgical history and problem list. Problem list updated.  Objective  Blood pressure 103/61, pulse 81, weight 204 lb 4.8 oz (92.7 kg). Pregravid weight 175 lb (79.4 kg) Total Weight Gain 29 lb 4.8 oz (13.3 kg) Urinalysis: Urine Protein Negative  Urine Glucose Negative  Fetal Status:     Movement: Present     General:  Alert, oriented and cooperative. Patient is in no acute distress.  Skin: Skin is warm and dry. No rash noted.   Cardiovascular: Normal heart rate noted  Respiratory: Normal respiratory effort, no problems with respiration noted  Abdomen: Soft, gravid, appropriate for gestational age. Pain/Pressure: Absent     Pelvic:  Cervical exam deferred        Extremities: Normal range of motion.     Mental Status: Normal mood and affect. Normal behavior. Normal judgment and thought content.   Assessment   26 y.o. G2P0010 at [redacted]w[redacted]d by  04/02/2023, by Ultrasound presenting for routine prenatal visit  Plan   second Problems (from 08/24/22  to present)     Problem Noted Resolved   Pregnancy, supervision, high-risk 08/24/2022 by Cleophas Dunker, CMA No   Overview Addendum 10/20/2022  9:06 PM by Rubie Maid, MD     Clinical Staff Provider  Office Location  [facility] Dating  04/02/2023, by Ultrasound  Language  English Anatomy US    Flu Vaccine  Declines Genetic Screen  NIPS: MaterniT21 nml. Female  TDaP vaccine    Hgb A1C or  GTT Early : Third trimester :   Covid    LAB RESULTS   Rhogam  O/Weak D/-- (01/29 0910)  Blood Type O/Weak D/-- (01/29 0910)   Feeding Plan formula Antibody Negative (01/29 0910)  Contraception Depo Provera Rubella 2.88 (01/29 0910)  Circumcision Yes RPR Non Reactive (01/29 0910)   Pediatrician  [facility] HBsAg Negative (01/29 0910)   Support Person Kae Heller HIV Non Reactive (01/29 0910)  Prenatal Classes Yes Varicella Immune    GBS  (For PCN allergy, check sensitivities)   BTL Consent  Hep C Non Reactive (01/29 0910)   VBAC Consent  Pap Diagnosis  Date Value Ref Range Status  02/19/2022 - Low grade squamous intraepithelial lesion (LSIL) (A)  Final      Hgb Electro      CF      SMA                    Preterm labor symptoms and general obstetric precautions including but not limited to vaginal bleeding, contractions, leaking of fluid and fetal movement were reviewed in detail with the patient. Please refer to After Visit Summary for other counseling recommendations.   No follow-ups on file.  Imagene Riches, CNM  11/17/2022 10:10 AM

## 2022-11-17 NOTE — Patient Instructions (Signed)

## 2022-11-17 NOTE — Progress Notes (Signed)
Routine Prenatal Care Visit  Subjective  Emily Hernandez is a 27 y.o. G2P0010 at [redacted]w[redacted]d being seen today for ongoing prenatal care.  She is currently monitored for the following issues for this high-risk pregnancy and has Idiopathic intracranial hypertension; Hypertrophy of breast; Pregnancy, supervision, high-risk; Obesity in pregnancy; Need for rhogam due to Rh negative mother; ADD (attention deficit disorder); Bipolar disorder; and History of migraine on their problem list.  ----------------------------------------------------------------------------------- Patient reports no complaints.  She has not picked a name for her baby. Feeling flutters. Had anatomy scan- low lying placenta- will reorder for 28 wks. Contractions: Not present. Vag. Bleeding: None.  Movement: Present. Leaking Fluid denies.  ----------------------------------------------------------------------------------- The following portions of the patient's history were reviewed and updated as appropriate: allergies, current medications, past family history, past medical history, past social history, past surgical history and problem list. Problem list updated.  Objective  Blood pressure 103/61, pulse 81, weight 204 lb 4.8 oz (92.7 kg). Pregravid weight 175 lb (79.4 kg) Total Weight Gain 29 lb 4.8 oz (13.3 kg) Urinalysis: Urine Protein Negative  Urine Glucose Negative  Fetal Status:     Movement: Present     General:  Alert, oriented and cooperative. Patient is in no acute distress.  Skin: Skin is warm and dry. No rash noted.   Cardiovascular: Normal heart rate noted  Respiratory: Normal respiratory effort, no problems with respiration noted  Abdomen: Soft, gravid, appropriate for gestational age. Pain/Pressure: Absent     Pelvic:  Cervical exam deferred        Extremities: Normal range of motion.     Mental Status: Normal mood and affect. Normal behavior. Normal judgment and thought content.   Assessment   27 y.o. G2P0010  at [redacted]w[redacted]d by  04/02/2023, by Ultrasound presenting for routine prenatal visit  Plan   second Problems (from 08/24/22 to present)     Problem Noted Resolved   Pregnancy, supervision, high-risk 08/24/2022 by Cleophas Dunker, CMA No   Overview Addendum 10/20/2022  9:06 PM by Rubie Maid, MD     Clinical Staff Provider  Office Location  [facility] Dating  04/02/2023, by Ultrasound  Language  English Anatomy US    Flu Vaccine  Declines Genetic Screen  NIPS: MaterniT21 nml. Female  TDaP vaccine    Hgb A1C or  GTT Early : Third trimester :   Covid    LAB RESULTS   Rhogam  O/Weak D/-- (01/29 0910)  Blood Type O/Weak D/-- (01/29 0910)   Feeding Plan formula Antibody Negative (01/29 0910)  Contraception Depo Provera Rubella 2.88 (01/29 0910)  Circumcision Yes RPR Non Reactive (01/29 0910)   Pediatrician  [facility] HBsAg Negative (01/29 0910)   Support Person Kae Heller HIV Non Reactive (01/29 0910)  Prenatal Classes Yes Varicella Immune    GBS  (For PCN allergy, check sensitivities)   BTL Consent  Hep C Non Reactive (01/29 0910)   VBAC Consent  Pap Diagnosis  Date Value Ref Range Status  02/19/2022 - Low grade squamous intraepithelial lesion (LSIL) (A)  Final      Hgb Electro      CF      SMA                    Preterm labor symptoms and general obstetric precautions including but not limited to vaginal bleeding, contractions, leaking of fluid and fetal movement were reviewed in detail with the patient. Please refer to After Visit Summary for other counseling recommendations.  No follow-ups on file.  Imagene Riches, CNM  11/17/2022 10:08 AM

## 2022-11-20 ENCOUNTER — Other Ambulatory Visit: Payer: Self-pay | Admitting: Obstetrics

## 2022-11-20 DIAGNOSIS — O4440 Low lying placenta NOS or without hemorrhage, unspecified trimester: Secondary | ICD-10-CM

## 2022-12-05 ENCOUNTER — Encounter: Payer: Self-pay | Admitting: Obstetrics and Gynecology

## 2022-12-05 ENCOUNTER — Observation Stay
Admission: EM | Admit: 2022-12-05 | Discharge: 2022-12-05 | Disposition: A | Discharge status: Home / Self Care | Payer: Medicaid Other | Attending: Obstetrics and Gynecology | Admitting: Obstetrics and Gynecology

## 2022-12-05 ENCOUNTER — Other Ambulatory Visit: Payer: Self-pay

## 2022-12-05 DIAGNOSIS — Z79899 Other long term (current) drug therapy: Secondary | ICD-10-CM | POA: Insufficient documentation

## 2022-12-05 DIAGNOSIS — Z3A23 23 weeks gestation of pregnancy: Secondary | ICD-10-CM | POA: Insufficient documentation

## 2022-12-05 DIAGNOSIS — O368120 Decreased fetal movements, second trimester, not applicable or unspecified: Principal | ICD-10-CM | POA: Insufficient documentation

## 2022-12-05 DIAGNOSIS — Z3490 Encounter for supervision of normal pregnancy, unspecified, unspecified trimester: Secondary | ICD-10-CM

## 2022-12-05 DIAGNOSIS — O0992 Supervision of high risk pregnancy, unspecified, second trimester: Secondary | ICD-10-CM

## 2022-12-05 DIAGNOSIS — Z87891 Personal history of nicotine dependence: Secondary | ICD-10-CM | POA: Diagnosis not present

## 2022-12-05 HISTORY — DX: Encounter for supervision of normal pregnancy, unspecified, unspecified trimester: Z34.90

## 2022-12-05 NOTE — OB Triage Note (Signed)

## 2022-12-05 NOTE — Discharge Summary (Signed)
Physician Final Progress Note  Patient ID: Emily Hernandez MRN: [id number] DOB/AGE: 10-06-1995 26 y.o.  Admit date: 12/05/2022 Admitting provider: Linzie Collin, MD Discharge date: 12/05/2022   Admission Diagnoses:  1) intrauterine pregnancy at [redacted]w[redacted]d  2) decreased fetal movement  Discharge Diagnoses:  Principal Problem:   Pregnancy    History of Present Illness: The patient is a 27 y.o. female G2P0010 at [redacted]w[redacted]d who presents for fetal decreased movement. Reports she has note felt any movement since last night. Normally she feels some sort of movement once a day. She has also felt a pain on  her lower right side that feels more like a "discomfort: and rates it a "2/10".    Past Medical History:  Diagnosis Date   Bipolar disorder    Cold sore    Depression    Dysplastic nevus 10/15/2020   Left upper back, severe. Exc 02/03/21 2:30pm.   Idiopathic intracranial hypertension    Vaccine for human papilloma virus (HPV) types 6, 11, 16, and 18 administered     Past Surgical History:  Procedure Laterality Date   BREAST REDUCTION SURGERY Bilateral 01/13/2022   Procedure: MAMMARY REDUCTION  (BREAST);  Surgeon: Janne Napoleon, MD;  Location: [facility] OR;  Service: Plastics;  Laterality: Bilateral;   none     WISDOM TOOTH EXTRACTION      No current facility-administered medications on file prior to encounter.   Current Outpatient Medications on File Prior to Encounter  Medication Sig Dispense Refill   Butalbital-APAP-Caffeine 50-325-40 MG capsule Take 1-2 capsules by mouth every 6 (six) hours as needed for headache. 30 capsule 3   Prenatal Vit-Fe Fumarate-FA (MULTIVITAMIN-PRENATAL) 27-0.8 MG TABS tablet Take 1 tablet by mouth daily at 12 noon.     valACYclovir (VALTREX) 1000 MG tablet Take 2 tabs BID x 1 day prn sx (Patient not taking: Reported on 08/24/2022) 30 tablet 1    Allergies  Allergen Reactions   Penicillins Hives, Swelling and Other (See Comments)    Swelling and Hives         Hives, throat swells    Social History   Socioeconomic History   Marital status: Significant Other    Spouse name: Government social research officer   Number of children: 0   Years of education: 14   Highest education level: Some college, no degree  Occupational History   Occupation: [employer]  Tobacco Use   Smoking status: Former    Packs/day: 0.25    Years: 2.00    Additional pack years: 0.00    Total pack years: 0.50    Types: Cigarettes    Quit date: 04/28/2021    Years since quitting: 1.6   Smokeless tobacco: Never  Vaping Use   Vaping Use: Former   Quit date: 04/14/2021  Substance and Sexual Activity   Alcohol use: Not Currently    Comment: social   Drug use: No   Sexual activity: Yes    Partners: Male    Birth control/protection: Injection    Comment: depo  Other Topics Concern   Not on file  Social History Narrative   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   Social Determinants of Health   Financial Resource Strain: Low Risk  (08/24/2022)   Overall Financial Resource Strain (CARDIA)    Difficulty of Paying Living Expenses: Not very hard  Food Insecurity: No Food Insecurity (08/24/2022)   Hunger Vital Sign  Worried About Programme researcher, broadcasting/film/video in the Last Year: Never true    Ran Out of Food in the Last Year: Never true  Transportation Needs: No Transportation Needs (08/24/2022)   PRAPARE - Administrator, Civil Service (Medical): No    Lack of Transportation (Non-Medical): No  Physical Activity: Insufficiently Active (08/24/2022)   Exercise Vital Sign    Days of Exercise per Week: 2 days    Minutes of Exercise per Session: 30 min  Stress: No Stress Concern Present (08/24/2022)   [employer] of Occupational Health - Occupational Stress Questionnaire    Feeling of Stress : Not at all  Social Connections: Moderately Isolated (08/24/2022)   Social Connection and Isolation Panel  [NHANES]    Frequency of Communication with Friends and Family: More than three times a week    Frequency of Social Gatherings with Friends and Family: Once a week    Attends Religious Services: Never    Database administrator or Organizations: No    Attends Banker Meetings: Never    Marital Status: Living with partner  Intimate Partner Violence: Not At Risk (08/24/2022)   Humiliation, Afraid, Rape, and Kick questionnaire    Fear of Current or Ex-Partner: No    Emotionally Abused: No    Physically Abused: No    Sexually Abused: No    Family History  Problem Relation Age of Onset   Healthy Mother    Healthy Father    Heart attack Maternal Grandmother    GI Bleed Maternal Grandfather    Cancer Paternal Grandmother    Healthy Half-Sister    Healthy Half-Sister    Healthy Half-Brother    Breast cancer Neg Hx    Ovarian cancer Neg Hx    Colon cancer Neg Hx    Diabetes Neg Hx      ROS see HPI   Physical Exam: BP 121/68 (BP Location: Left Arm)   Pulse 92   Temp 99 F (37.2 C) (Oral)   Resp 16   Ht 5\' 3"  (1.6 m)   Wt 92.5 kg   LMP  (LMP Unknown)   BMI 36.14 kg/m   Physical Exam Constitutional:      Appearance: Normal appearance.  Cardiovascular:     Rate and Rhythm: Normal rate and regular rhythm.  Pulmonary:     Effort: Pulmonary effort is normal.     Breath sounds: Normal breath sounds.  Abdominal:     General: There is no distension.     Tenderness: There is no abdominal tenderness. There is no guarding.     Comments: gravid  Neurological:     Mental Status: She is alert.  Skin:    General: Skin is warm.  Psychiatric:        Mood and Affect: Mood normal.    Exam done by Colbert Ewing RN   Consults: None  Significant Findings/ Diagnostic Studies: NA  Procedures: NA  Hospital Course: The patient was admitted to Labor and Delivery Triage for observation. Fetal heart tones were found to be in the 140's. Pt was immediately relieved. With  further discussion about her right sided pain with the RN, it was determined she most likely has been experiencing round ligament pain. Pt felt reassured and desired to be discharged home prior to seeing this CNM. This CNM was not immediately available while this pt was in triage.   Discharge Condition: good  Disposition: Discharge disposition: 01-Home or Self Care  Diet: Regular diet  Discharge Activity: Activity as tolerated   Allergies as of 12/05/2022       Reactions   Penicillins Hives, Swelling, Other (See Comments)   Swelling and Hives        Hives, throat swells        Medication List     STOP taking these medications    ibuprofen 200 MG tablet Commonly known as: ADVIL       TAKE these medications    Butalbital-APAP-Caffeine 50-325-40 MG capsule Take 1-2 capsules by mouth every 6 (six) hours as needed for headache.   multivitamin-prenatal 27-0.8 MG Tabs tablet Take 1 tablet by mouth daily at 12 noon.   valACYclovir 1000 MG tablet Commonly known as: VALTREX Take 2 tabs BID x 1 day prn sx         Total time spent taking care of this patient: 15 minutes  Signed: Ellouise Newer Nemiah Kissner, CNM  12/05/2022, 7:58 PM

## 2022-12-05 NOTE — OB Triage Note (Signed)
Pt Emily Hernandez 27 y.o. presents to labor and delivery triage reporting DFM. Pt is a G2P0010 at [redacted]w[redacted]d . Pt denies signs and symptons consistent with rupture of membranes or active vaginal bleeding. Pt denies contractions and states positive fetal movement. External FM and TOCO applied to non-tender abdomen and assessing. Doppler FHR is 145-150 Vital signs obtained and within normal limits. Provider notified of pt.

## 2022-12-07 ENCOUNTER — Telehealth: Payer: Self-pay

## 2022-12-07 NOTE — Telephone Encounter (Signed)
Called Emily Hernandez and left voicemail calling to see how she is feeling after receiving  after hours nurse triage paper. Emily Hernandez was having deceased movement and tightness and pressure on right side. Patient did go to ED.

## 2022-12-15 ENCOUNTER — Encounter: Payer: Self-pay | Admitting: Advanced Practice Midwife

## 2022-12-15 ENCOUNTER — Ambulatory Visit (INDEPENDENT_AMBULATORY_CARE_PROVIDER_SITE_OTHER): Payer: Medicaid Other | Admitting: Advanced Practice Midwife

## 2022-12-15 VITALS — BP 118/75 | HR 85 | Wt 212.8 lb

## 2022-12-15 DIAGNOSIS — Z3A24 24 weeks gestation of pregnancy: Secondary | ICD-10-CM

## 2022-12-15 DIAGNOSIS — O0992 Supervision of high risk pregnancy, unspecified, second trimester: Secondary | ICD-10-CM

## 2022-12-15 DIAGNOSIS — Z113 Encounter for screening for infections with a predominantly sexual mode of transmission: Secondary | ICD-10-CM

## 2022-12-15 DIAGNOSIS — G932 Benign intracranial hypertension: Secondary | ICD-10-CM

## 2022-12-15 DIAGNOSIS — Z130 Encounter for screening for diseases of the blood and blood-forming organs and certain disorders involving the immune mechanism: Secondary | ICD-10-CM

## 2022-12-15 DIAGNOSIS — Z131 Encounter for screening for diabetes mellitus: Secondary | ICD-10-CM

## 2022-12-15 DIAGNOSIS — Z369 Encounter for antenatal screening, unspecified: Secondary | ICD-10-CM

## 2022-12-15 LAB — POCT URINALYSIS DIPSTICK OB
Bilirubin, UA: NEGATIVE
Blood, UA: NEGATIVE
Glucose, UA: NEGATIVE
Ketones, UA: NEGATIVE
Leukocytes, UA: NEGATIVE
Nitrite, UA: NEGATIVE
POC,PROTEIN,UA: NEGATIVE
Spec Grav, UA: 1.015 (ref 1.010–1.025)
Urobilinogen, UA: 0.2 E.U./dL
pH, UA: 6.5 (ref 5.0–8.0)

## 2022-12-15 NOTE — Progress Notes (Signed)
Routine Prenatal Care Visit  Subjective  Emily Hernandez is a 27 y.o. G2P0010 at [redacted]w[redacted]d being seen today for ongoing prenatal care.  She is currently monitored for the following issues for this high-risk pregnancy and has Idiopathic intracranial hypertension; Hypertrophy of breast; Pregnancy, supervision, high-risk; Obesity in pregnancy; Need for rhogam due to Rh negative mother; ADD (attention deficit disorder); Bipolar disorder (HCC); History of migraine; Pregnancy; and Decreased fetal movements in second trimester on their problem list.  ----------------------------------------------------------------------------------- Patient reports no complaints. Reviewed activity and hydration recommendations for summer and sleep recommendations.   Per MFM Booker- recheck ABO/screen at 28 weeks and monthly until delivery to check titers (due to weak D). If titers are rising will need to have MFM consult.   Contractions: Not present. Vag. Bleeding: None.  Movement: Present. Leaking Fluid denies.  ----------------------------------------------------------------------------------- The following portions of the patient's history were reviewed and updated as appropriate: allergies, current medications, past family history, past medical history, past social history, past surgical history and problem list. Problem list updated.  Objective  Blood pressure 118/75, pulse 85, weight 212 lb 12.8 oz (96.5 kg). Pregravid weight 175 lb (79.4 kg) Total Weight Gain 37 lb 12.8 oz (17.1 kg) Urinalysis: Urine Protein Negative  Urine Glucose Negative  Fetal Status: Fetal Heart Rate (bpm): 135 Fundal Height: 25 cm Movement: Present     General:  Alert, oriented and cooperative. Patient is in no acute distress.  Skin: Skin is warm and dry. No rash noted.   Cardiovascular: Normal heart rate noted  Respiratory: Normal respiratory effort, no problems with respiration noted  Abdomen: Soft, gravid, appropriate for gestational  age. Pain/Pressure: Absent     Pelvic:  Cervical exam deferred        Extremities: Normal range of motion.  Edema: None  Mental Status: Normal mood and affect. Normal behavior. Normal judgment and thought content.   Assessment   27 y.o. G2P0010 at [redacted]w[redacted]d by  04/02/2023, by Ultrasound presenting for routine prenatal visit  Plan   second Problems (from 08/24/22 to present)     Problem Noted Resolved   Pregnancy, supervision, high-risk 08/24/2022 by Loran Senters, CMA No   Overview Addendum 10/20/2022  9:06 PM by Hildred Laser, MD     Clinical Staff Provider  Office Location  [facility] Dating  04/02/2023, by Ultrasound  Language  English Anatomy US    Flu Vaccine  Declines Genetic Screen  NIPS: MaterniT21 nml. Female  TDaP vaccine    Hgb A1C or  GTT Early : Third trimester :   Covid    LAB RESULTS   Rhogam  O/Weak D/-- (01/29 0910)  Blood Type O/Weak D/-- (01/29 0910)   Feeding Plan formula Antibody Negative (01/29 0910)  Contraception Depo Provera Rubella 2.88 (01/29 0910)  Circumcision Yes RPR Non Reactive (01/29 0910)   Pediatrician  [facility] HBsAg Negative (01/29 0910)   Support Person Fredricka Bonine HIV Non Reactive (01/29 0910)  Prenatal Classes Yes Varicella Immune    GBS  (For PCN allergy, check sensitivities)   BTL Consent  Hep C Non Reactive (01/29 0910)   VBAC Consent  Pap Diagnosis  Date Value Ref Range Status  02/19/2022 - Low grade squamous intraepithelial lesion (LSIL) (A)  Final      Hgb Electro      CF      SMA                    Preterm labor symptoms and general obstetric  precautions including but not limited to vaginal bleeding, contractions, leaking of fluid and fetal movement were reviewed in detail with the patient. Please refer to After Visit Summary for other counseling recommendations.   Return in about 4 weeks (around 01/12/2023) for 28 wk labs and rob.  Tresea Mall, CNM 12/15/2022 8:35 AM

## 2023-01-12 ENCOUNTER — Ambulatory Visit (INDEPENDENT_AMBULATORY_CARE_PROVIDER_SITE_OTHER): Payer: Medicaid Other | Admitting: Obstetrics

## 2023-01-12 ENCOUNTER — Ambulatory Visit (INDEPENDENT_AMBULATORY_CARE_PROVIDER_SITE_OTHER): Payer: Medicaid Other

## 2023-01-12 ENCOUNTER — Other Ambulatory Visit: Payer: Medicaid Other

## 2023-01-12 ENCOUNTER — Encounter: Payer: Medicaid Other | Admitting: Obstetrics

## 2023-01-12 VITALS — BP 107/65 | HR 92 | Wt 213.3 lb

## 2023-01-12 DIAGNOSIS — Z131 Encounter for screening for diabetes mellitus: Secondary | ICD-10-CM

## 2023-01-12 DIAGNOSIS — O0992 Supervision of high risk pregnancy, unspecified, second trimester: Secondary | ICD-10-CM

## 2023-01-12 DIAGNOSIS — Z113 Encounter for screening for infections with a predominantly sexual mode of transmission: Secondary | ICD-10-CM

## 2023-01-12 DIAGNOSIS — Z23 Encounter for immunization: Secondary | ICD-10-CM

## 2023-01-12 DIAGNOSIS — Z3A28 28 weeks gestation of pregnancy: Secondary | ICD-10-CM

## 2023-01-12 DIAGNOSIS — O4443 Low lying placenta NOS or without hemorrhage, third trimester: Secondary | ICD-10-CM | POA: Diagnosis not present

## 2023-01-12 DIAGNOSIS — Z369 Encounter for antenatal screening, unspecified: Secondary | ICD-10-CM

## 2023-01-12 DIAGNOSIS — O4440 Low lying placenta NOS or without hemorrhage, unspecified trimester: Secondary | ICD-10-CM

## 2023-01-12 DIAGNOSIS — Z3A30 30 weeks gestation of pregnancy: Secondary | ICD-10-CM

## 2023-01-12 DIAGNOSIS — Z3490 Encounter for supervision of normal pregnancy, unspecified, unspecified trimester: Secondary | ICD-10-CM

## 2023-01-12 DIAGNOSIS — O0993 Supervision of high risk pregnancy, unspecified, third trimester: Secondary | ICD-10-CM

## 2023-01-12 DIAGNOSIS — Z130 Encounter for screening for diseases of the blood and blood-forming organs and certain disorders involving the immune mechanism: Secondary | ICD-10-CM

## 2023-01-12 DIAGNOSIS — Z3493 Encounter for supervision of normal pregnancy, unspecified, third trimester: Secondary | ICD-10-CM

## 2023-01-12 LAB — POCT URINALYSIS DIPSTICK OB
Bilirubin, UA: NEGATIVE
Blood, UA: NEGATIVE
Ketones, UA: NEGATIVE
Leukocytes, UA: NEGATIVE
Nitrite, UA: NEGATIVE
POC,PROTEIN,UA: NEGATIVE
Spec Grav, UA: 1.01 (ref 1.010–1.025)
Urobilinogen, UA: 0.2 E.U./dL
pH, UA: 6 (ref 5.0–8.0)

## 2023-01-12 NOTE — Progress Notes (Signed)
Routine Prenatal Care Visit  Subjective  Emily Hernandez is a 27 y.o. G2P0010 at [redacted]w[redacted]d being seen today for ongoing prenatal care.  She is currently monitored for the following issues for this low-risk pregnancy and has Idiopathic intracranial hypertension; Hypertrophy of breast; Pregnancy, supervision, high-risk; Obesity in pregnancy; Need for rhogam due to Rh negative mother; ADD (attention deficit disorder); Bipolar disorder (HCC); History of migraine; Pregnancy; and Decreased fetal movements in second trimester on their problem list.  ----------------------------------------------------------------------------------- Patient reports no complaints.  Had growth today- measuring large for dates Admits to drinking several gallons of milk each week. Contractions: Not present. Vag. Bleeding: None.  Movement: Present. Leaking Fluid denies.  ----------------------------------------------------------------------------------- The following portions of the patient's history were reviewed and updated as appropriate: allergies, current medications, past family history, past medical history, past social history, past surgical history and problem list. Problem list updated.  Objective  Blood pressure 107/65, pulse 92, weight 213 lb 4.8 oz (96.8 kg). Pregravid weight 175 lb (79.4 kg) Total Weight Gain 38 lb 4.8 oz (17.4 kg) Urinalysis: Urine Protein Negative  Urine Glucose (!) Trace  Fetal Status: Fetal Heart Rate (bpm): 146 Fundal Height: 32 cm Movement: Present     General:  Alert, oriented and cooperative. Patient is in no acute distress.  Skin: Skin is warm and dry. No rash noted.   Cardiovascular: Normal heart rate noted  Respiratory: Normal respiratory effort, no problems with respiration noted  Abdomen: Soft, gravid, appropriate for gestational age. Pain/Pressure: Absent     Pelvic:  Cervical exam deferred        Extremities: Normal range of motion.  Edema: None  Mental Status: Normal mood and  affect. Normal behavior. Normal judgment and thought content.   Assessment   27 y.o. G2P0010 at [redacted]w[redacted]d by  04/02/2023, by Ultrasound presenting for  routine  prenatal visit Excessive weight gain in pregnancy Fundal height high for dates Plan   second Problems (from 08/24/22 to present)     Problem Noted Resolved   Pregnancy, supervision, high-risk 08/24/2022 by Loran Senters, CMA No   Overview Addendum 10/20/2022  9:06 PM by Hildred Laser, MD     Clinical Staff Provider  Office Location  [facility] Dating  04/02/2023, by Ultrasound  Language  English Anatomy US    Flu Vaccine  Declines Genetic Screen  NIPS: MaterniT21 nml. Female  TDaP vaccine    Hgb A1C or  GTT Early : Third trimester :   Covid    LAB RESULTS   Rhogam  O/Weak D/-- (01/29 0910)  Blood Type O/Weak D/-- (01/29 0910)   Feeding Plan formula Antibody Negative (01/29 0910)  Contraception Depo Provera Rubella 2.88 (01/29 0910)  Circumcision Yes RPR Non Reactive (01/29 0910)   Pediatrician   [facility] HBsAg Negative (01/29 0910)   Support Person Fredricka Bonine HIV Non Reactive (01/29 0910)  Prenatal Classes Yes Varicella Immune    GBS  (For PCN allergy, check sensitivities)   BTL Consent  Hep C Non Reactive (01/29 0910)   VBAC Consent  Pap Diagnosis  Date Value Ref Range Status  02/19/2022 - Low grade squamous intraepithelial lesion (LSIL) (A)  Final      Hgb Electro      CF      SMA                    Preterm labor symptoms and general obstetric precautions including but not limited to vaginal bleeding, contractions, leaking of fluid and fetal  movement were reviewed in detail with the patient. Please refer to After Visit Summary for other counseling recommendations.  28 wk labs today. Increase her walking program Advised to greatly decrease her milk intake. Switch to skin or 1% milk. Decrease rice, breads, sugary foods, pasta. I have ordered another growth scan for one month out.  Return in about 2 weeks  (around 01/26/2023) for return OB.  Mirna Mires, CNM  01/12/2023 11:15 AM

## 2023-01-12 NOTE — Patient Instructions (Signed)
Tdap (Tetanus, Diphtheria, Pertussis) Vaccine: What You Need to Know Many vaccine information statements are available in Spanish and other languages. See PromoAge.com.br. 1. Why get vaccinated? Tdap vaccine can prevent tetanus, diphtheria, and pertussis. Diphtheria and pertussis spread from person to person. Tetanus enters the body through cuts or wounds. TETANUS (T) causes painful stiffening of the muscles. Tetanus can lead to serious health problems, including being unable to open the mouth, having trouble swallowing and breathing, or death. DIPHTHERIA (D) can lead to difficulty breathing, heart failure, paralysis, or death. PERTUSSIS (aP), also known as "whooping cough," can cause uncontrollable, violent coughing that makes it hard to breathe, eat, or drink. Pertussis can be extremely serious especially in babies and young children, causing pneumonia, convulsions, brain damage, or death. In teens and adults, it can cause weight loss, loss of bladder control, passing out, and rib fractures from severe coughing. 2. Tdap vaccine Tdap is only for children 7 years and older, adolescents, and adults.  Adolescents should receive a single dose of Tdap, preferably at age 15 or 12 years. Pregnant people should get a dose of Tdap during every pregnancy, preferably during the early part of the third trimester, to help protect the newborn from pertussis. Infants are most at risk for severe, life-threatening complications from pertussis. Adults who have never received Tdap should get a dose of Tdap. Also, adults should receive a booster dose of either Tdap or Td (a different vaccine that protects against tetanus and diphtheria but not pertussis) every 10 years, or after 5 years in the case of a severe or dirty wound or burn. Tdap may be given at the same time as other vaccines. 3. Talk with your health care provider Tell your vaccine provider if the person getting the vaccine: Has had an allergic  reaction after a previous dose of any vaccine that protects against tetanus, diphtheria, or pertussis, or has any severe, life-threatening allergies Has had a coma, decreased level of consciousness, or prolonged seizures within 7 days after a previous dose of any pertussis vaccine (DTP, DTaP, or Tdap) Has seizures or another nervous system problem Has ever had Guillain-Barr Syndrome (also called "GBS") Has had severe pain or swelling after a previous dose of any vaccine that protects against tetanus or diphtheria In some cases, your health care provider may decide to postpone Tdap vaccination until a future visit. People with minor illnesses, such as a cold, may be vaccinated. People who are moderately or severely ill should usually wait until they recover before getting Tdap vaccine.  Your health care provider can give you more information. 4. Risks of a vaccine reaction Pain, redness, or swelling where the shot was given, mild fever, headache, feeling tired, and nausea, vomiting, diarrhea, or stomachache sometimes happen after Tdap vaccination. People sometimes faint after medical procedures, including vaccination. Tell your provider if you feel dizzy or have vision changes or ringing in the ears.  As with any medicine, there is a very remote chance of a vaccine causing a severe allergic reaction, other serious injury, or death. 5. What if there is a serious problem? An allergic reaction could occur after the vaccinated person leaves the clinic. If you see signs of a severe allergic reaction (hives, swelling of the face and throat, difficulty breathing, a fast heartbeat, dizziness, or weakness), call 9-1-1 and get the person to the nearest hospital. For other signs that concern you, call your health care provider.  Adverse reactions should be reported to the Vaccine Adverse Event Reporting  System (VAERS). Your health care provider will usually file this report, or you can do it yourself. Visit the  VAERS website at www.vaers.LAgents.no or call 506-643-7886. VAERS is only for reporting reactions, and VAERS staff members do not give medical advice. 6. The National Vaccine Injury Compensation Program The [employer] Vaccine Injury Compensation Program (VICP) is a federal program that was created to compensate people who may have been injured by certain vaccines. Claims regarding alleged injury or death due to vaccination have a time limit for filing, which may be as short as two years. Visit the VICP website at SpiritualWord.at or call 317-531-5589 to learn about the program and about filing a claim. 7. How can I learn more? Ask your health care provider. Call your local or state health department. Visit the website of the Food and Drug Administration (FDA) for vaccine package inserts and additional information at FinderList.no. Contact the Centers for Disease Control and Prevention (CDC): Call (443)034-9375 (1-800-CDC-INFO) or Visit CDC's website at PicCapture.uy. Source: CDC Vaccine Information Statement Tdap (Tetanus, Diphtheria, Pertussis) Vaccine (03/22/2020) This same material is available at FootballExhibition.com.br for no charge. This information is not intended to replace advice given to you by your health care provider. Make sure you discuss any questions you have with your health care provider. Document Revised: 09/18/2022 Document Reviewed: 09/18/2022 Elsevier Patient Education  2024 [employer].

## 2023-01-16 LAB — 28 WEEK RH+PANEL
Basophils Absolute: 0 10*3/uL (ref 0.0–0.2)
Basos: 1 %
EOS (ABSOLUTE): 0.1 10*3/uL (ref 0.0–0.4)
Eos: 1 %
Gestational Diabetes Screen: 119 mg/dL (ref 70–139)
HIV Screen 4th Generation wRfx: NONREACTIVE
Hematocrit: 33.8 % — ABNORMAL LOW (ref 34.0–46.6)
Hemoglobin: 11 g/dL — ABNORMAL LOW (ref 11.1–15.9)
Immature Grans (Abs): 0 10*3/uL (ref 0.0–0.1)
Immature Granulocytes: 0 %
Lymphocytes Absolute: 1.3 10*3/uL (ref 0.7–3.1)
Lymphs: 24 %
MCH: 29.1 pg (ref 26.6–33.0)
MCHC: 32.5 g/dL (ref 31.5–35.7)
MCV: 89 fL (ref 79–97)
Monocytes Absolute: 0.3 10*3/uL (ref 0.1–0.9)
Monocytes: 6 %
Neutrophils Absolute: 3.9 10*3/uL (ref 1.4–7.0)
Neutrophils: 68 %
Platelets: 178 10*3/uL (ref 150–450)
RBC: 3.78 x10E6/uL (ref 3.77–5.28)
RDW: 11.9 % (ref 11.7–15.4)
RPR Ser Ql: NONREACTIVE
WBC: 5.7 10*3/uL (ref 3.4–10.8)

## 2023-01-16 LAB — ABO/RH

## 2023-01-17 ENCOUNTER — Encounter: Payer: Self-pay | Admitting: Obstetrics

## 2023-01-26 ENCOUNTER — Ambulatory Visit (INDEPENDENT_AMBULATORY_CARE_PROVIDER_SITE_OTHER): Payer: Medicaid Other | Admitting: Obstetrics and Gynecology

## 2023-01-26 VITALS — BP 111/65 | HR 89 | Wt 214.6 lb

## 2023-01-26 DIAGNOSIS — E669 Obesity, unspecified: Secondary | ICD-10-CM

## 2023-01-26 DIAGNOSIS — O99210 Obesity complicating pregnancy, unspecified trimester: Secondary | ICD-10-CM

## 2023-01-26 DIAGNOSIS — F909 Attention-deficit hyperactivity disorder, unspecified type: Secondary | ICD-10-CM

## 2023-01-26 DIAGNOSIS — O99343 Other mental disorders complicating pregnancy, third trimester: Secondary | ICD-10-CM

## 2023-01-26 DIAGNOSIS — O10013 Pre-existing essential hypertension complicating pregnancy, third trimester: Secondary | ICD-10-CM

## 2023-01-26 DIAGNOSIS — G932 Benign intracranial hypertension: Secondary | ICD-10-CM

## 2023-01-26 DIAGNOSIS — O0993 Supervision of high risk pregnancy, unspecified, third trimester: Secondary | ICD-10-CM

## 2023-01-26 DIAGNOSIS — O99213 Obesity complicating pregnancy, third trimester: Secondary | ICD-10-CM

## 2023-01-26 DIAGNOSIS — R87612 Low grade squamous intraepithelial lesion on cytologic smear of cervix (LGSIL): Secondary | ICD-10-CM

## 2023-01-26 DIAGNOSIS — Z3A30 30 weeks gestation of pregnancy: Secondary | ICD-10-CM

## 2023-01-26 DIAGNOSIS — F319 Bipolar disorder, unspecified: Secondary | ICD-10-CM

## 2023-01-26 LAB — POCT URINALYSIS DIPSTICK OB
Bilirubin, UA: NEGATIVE
Blood, UA: NEGATIVE
Glucose, UA: NEGATIVE
Ketones, UA: NEGATIVE
Nitrite, UA: NEGATIVE
Spec Grav, UA: 1.02 (ref 1.010–1.025)
Urobilinogen, UA: 0.2 E.U./dL
pH, UA: 6.5 (ref 5.0–8.0)

## 2023-01-26 NOTE — Progress Notes (Signed)
ROB: Patient is a 27 y.o. G2P0010 at [redacted]w[redacted]d who presents for routine OB care.  Pregnancy is complicated by  Idiopathic intracranial hypertension; Pregnancy, supervision, high-risk; Obesity in pregnancy; Need for rhogam due to Rh negative mother; ADD (attention deficit disorder); Bipolar disorder (HCC); History of migraine. Patient denies complaints.  Has questions about her growth scan, wonders if due date needs to be changed. Discussed due date is to remain the same.  Placenta also no longer low-lying. If concerns about growth later int he pregnancy, can schedule follow up ultrasound. Also still has not completed her carrier testing, notes it has been drawn twice.  Discussed that it was her preference if she still desired screening. Could also be performed outside of pregnancy as well.  Reviewed 28 week labs, normal glucola. Patient notes some anxiety from social media regarding IUFD. Discussed importance of fetal kick counts, advised on red flag symptoms and when to follow up.  Desires circumcision for female infant. Discussed that this could be performed in the hospital. RTC in 2 weeks.

## 2023-01-26 NOTE — Progress Notes (Signed)
ROB [redacted]w[redacted]d: She is doing well. She has not had her genetic testing yet. She reports good fetal movement.

## 2023-02-09 ENCOUNTER — Ambulatory Visit (INDEPENDENT_AMBULATORY_CARE_PROVIDER_SITE_OTHER): Payer: Medicaid Other | Admitting: Advanced Practice Midwife

## 2023-02-09 ENCOUNTER — Encounter: Payer: Self-pay | Admitting: Advanced Practice Midwife

## 2023-02-09 VITALS — BP 113/71 | HR 94 | Wt 217.9 lb

## 2023-02-09 DIAGNOSIS — Z369 Encounter for antenatal screening, unspecified: Secondary | ICD-10-CM

## 2023-02-09 DIAGNOSIS — Z3A32 32 weeks gestation of pregnancy: Secondary | ICD-10-CM

## 2023-02-09 DIAGNOSIS — O99353 Diseases of the nervous system complicating pregnancy, third trimester: Secondary | ICD-10-CM

## 2023-02-09 DIAGNOSIS — O0993 Supervision of high risk pregnancy, unspecified, third trimester: Secondary | ICD-10-CM

## 2023-02-09 DIAGNOSIS — G932 Benign intracranial hypertension: Secondary | ICD-10-CM

## 2023-02-09 LAB — POCT URINALYSIS DIPSTICK OB
Bilirubin, UA: NEGATIVE
Blood, UA: NEGATIVE
Glucose, UA: NEGATIVE
Ketones, UA: NEGATIVE
Leukocytes, UA: NEGATIVE
Nitrite, UA: NEGATIVE
POC,PROTEIN,UA: NEGATIVE
Spec Grav, UA: 1.02 (ref 1.010–1.025)
Urobilinogen, UA: 0.2 E.U./dL
pH, UA: 7 (ref 5.0–8.0)

## 2023-02-09 NOTE — Addendum Note (Signed)
Addended by: Loney Laurence on: 02/09/2023 10:32 AM   Modules accepted: Orders

## 2023-02-09 NOTE — Progress Notes (Signed)
Routine Prenatal Care Visit  Subjective  Emily Hernandez is a 27 y.o. G2P0010 at [redacted]w[redacted]d being seen today for ongoing prenatal care.  She is currently monitored for the following issues for this high-risk pregnancy and has Idiopathic intracranial hypertension; Hypertrophy of breast; Pregnancy, supervision, high-risk; Obesity in pregnancy; Need for rhogam due to Rh negative mother; ADD (attention deficit disorder); Bipolar disorder (HCC); History of migraine; Pregnancy; Decreased fetal movements in second trimester; and LGSIL on Pap smear of cervix on their problem list.  ----------------------------------------------------------------------------------- Patient reports no complaints and she is doing well/good fetal movement.  Repeat ABO/Rh today.  Contractions: Not present. Vag. Bleeding: None.  Movement: Present. Leaking Fluid denies.  ----------------------------------------------------------------------------------- The following portions of the patient's history were reviewed and updated as appropriate: allergies, current medications, past family history, past medical history, past social history, past surgical history and problem list. Problem list updated.  Objective  Blood pressure 113/71, pulse 94, weight 217 lb 14.4 oz (98.8 kg). Pregravid weight 175 lb (79.4 kg) Total Weight Gain 42 lb 14.4 oz (19.5 kg) Urinalysis: Urine Protein    Urine Glucose    Fetal Status: Fetal Heart Rate (bpm): 128 Fundal Height: 33 cm Movement: Present     General:  Alert, oriented and cooperative. Patient is in no acute distress.  Skin: Skin is warm and dry. No rash noted.   Cardiovascular: Normal heart rate noted  Respiratory: Normal respiratory effort, no problems with respiration noted  Abdomen: Soft, gravid, appropriate for gestational age. Pain/Pressure: Absent     Pelvic:  Cervical exam deferred        Extremities: Normal range of motion.  Edema: None  Mental Status: Normal mood and affect. Normal  behavior. Normal judgment and thought content.   Assessment   27 y.o. G2P0010 at [redacted]w[redacted]d by  04/02/2023, by Ultrasound presenting for routine prenatal visit  Plan   second Problems (from 08/24/22 to present)     Problem Noted Resolved   Pregnancy, supervision, high-risk 08/24/2022 by Loran Senters, CMA No   Overview Addendum 01/26/2023  2:47 PM by Hildred Laser, MD     Clinical Staff Provider  Office Location  [facility] Dating  04/02/2023, by Ultrasound  Language  English Anatomy US    Flu Vaccine  Declines Genetic Screen  NIPS: MaterniT21 nml. Female  TDaP vaccine    Hgb A1C or  GTT Early : Third trimester : 119  Covid    LAB RESULTS   Rhogam  O/Weak D/-- (01/29 0910)  Blood Type O/Weak D/-- (01/29 0910)   Feeding Plan formula Antibody Negative (01/29 0910)  Contraception Depo Provera Rubella 2.88 (01/29 0910)  Circumcision Yes RPR Non Reactive (01/29 0910)   Pediatrician  [facility] HBsAg Negative (01/29 0910)   Support Person Fredricka Bonine HIV Non Reactive (01/29 0910)  Prenatal Classes Yes Varicella Immune    GBS  (For PCN allergy, check sensitivities)   BTL Consent  Hep C Non Reactive (01/29 0910)   VBAC Consent  Pap Diagnosis  Date Value Ref Range Status  02/19/2022 - Low grade squamous intraepithelial lesion (LSIL) (A)  Final      Hgb Electro      CF      SMA                    Preterm labor symptoms and general obstetric precautions including but not limited to vaginal bleeding, contractions, leaking of fluid and fetal movement were reviewed in detail with the patient. Please  refer to After Visit Summary for other counseling recommendations.   Return in about 2 weeks (around 02/23/2023) for rob.  Tresea Mall, CNM 02/09/2023 10:14 AM

## 2023-02-10 LAB — ABO/RH

## 2023-02-12 ENCOUNTER — Ambulatory Visit (INDEPENDENT_AMBULATORY_CARE_PROVIDER_SITE_OTHER): Payer: Medicaid Other

## 2023-02-12 DIAGNOSIS — Z3490 Encounter for supervision of normal pregnancy, unspecified, unspecified trimester: Secondary | ICD-10-CM

## 2023-02-12 DIAGNOSIS — Z3493 Encounter for supervision of normal pregnancy, unspecified, third trimester: Secondary | ICD-10-CM | POA: Diagnosis not present

## 2023-02-12 DIAGNOSIS — Z3A33 33 weeks gestation of pregnancy: Secondary | ICD-10-CM

## 2023-02-12 DIAGNOSIS — O0993 Supervision of high risk pregnancy, unspecified, third trimester: Secondary | ICD-10-CM

## 2023-02-17 ENCOUNTER — Telehealth: Payer: Self-pay

## 2023-02-17 NOTE — Telephone Encounter (Signed)
Pt calling; has questions about her last visit and blood work results.  [phone]

## 2023-02-19 NOTE — Telephone Encounter (Signed)
Responded to patient's inquiry via Mychart.

## 2023-02-23 ENCOUNTER — Ambulatory Visit (INDEPENDENT_AMBULATORY_CARE_PROVIDER_SITE_OTHER): Payer: Medicaid Other | Admitting: Obstetrics

## 2023-02-23 VITALS — BP 95/78 | HR 98 | Wt 217.0 lb

## 2023-02-23 DIAGNOSIS — O0993 Supervision of high risk pregnancy, unspecified, third trimester: Secondary | ICD-10-CM

## 2023-02-23 DIAGNOSIS — Z3A34 34 weeks gestation of pregnancy: Secondary | ICD-10-CM

## 2023-02-23 LAB — POCT URINALYSIS DIPSTICK OB
Bilirubin, UA: NEGATIVE
Blood, UA: NEGATIVE
Glucose, UA: NEGATIVE
Ketones, UA: NEGATIVE
Leukocytes, UA: NEGATIVE
Nitrite, UA: NEGATIVE
POC,PROTEIN,UA: NEGATIVE
Spec Grav, UA: 1.02 (ref 1.010–1.025)
Urobilinogen, UA: 0.2 E.U./dL
pH, UA: 6 (ref 5.0–8.0)

## 2023-02-23 NOTE — Assessment & Plan Note (Addendum)
-  Reviewed weak D status with Dr. Valentino Saxon. Plan to recheck at 36 weeks. Rhogam not indicated per Dr. Valentino Saxon -Reviewed s/s of PTL and when to go to the hospital -Discussed GBS testing at next visit -Discussed round ligament pain -Gayland Curry, case manager, spoke with patient today

## 2023-02-23 NOTE — Progress Notes (Signed)
    Return Prenatal Note   Assessment/Plan   Plan  27 y.o. G2P0010 at [redacted]w[redacted]d presents for follow-up OB visit. Reviewed prenatal record including previous visit note.  Pregnancy, supervision, high-risk -Reviewed weak D status with Dr. Valentino Saxon. Plan to recheck at 36 weeks. Rhogam not indicated per Dr. Valentino Saxon -Reviewed s/s of PTL and when to go to the hospital -Discussed GBS testing at next visit -Discussed round ligament pain -Gayland Curry, case manager, spoke with patient otday    No orders of the defined types were placed in this encounter.  Return in about 2 weeks (around 03/09/2023).   Future Appointments  Date Time Provider Department Center  03/09/2023  8:15 AM Free, Lindalou Hose, CNM [facility] None    For next visit:  ROB with GBS screening , ABO testing     Subjective   26 y.o. G2P0010 at [redacted]w[redacted]d presents for this follow-up prenatal visit.  Laura is doing well. She is feeling many of the discomforts of late pregnancy but is coping well. She does wake up in the night with contractions but they resolve spontaneously. She has questions regarding the ABO testing.  Patient reports: Movement: Present Contractions: Irritability  Objective   Flow sheet Vitals: Pulse Rate: 98 BP: 95/78 Total weight gain: 42 lb (19.1 kg)  General Appearance  No acute distress, well appearing, and well nourished Pulmonary   Normal work of breathing Neurologic   Alert and oriented to person, place, and time Psychiatric   Mood and affect within normal limits  Glenetta Borg, CNM  02/23/2411:06 PM

## 2023-02-23 NOTE — Addendum Note (Signed)
Addended by: Loney Laurence on: 02/23/2023 04:09 PM   Modules accepted: Orders

## 2023-03-09 ENCOUNTER — Other Ambulatory Visit (HOSPITAL_COMMUNITY)
Admission: RE | Admit: 2023-03-09 | Discharge: 2023-03-09 | Disposition: A | Discharge status: Home / Self Care | Payer: Medicaid Other | Source: Ambulatory Visit

## 2023-03-09 ENCOUNTER — Ambulatory Visit (INDEPENDENT_AMBULATORY_CARE_PROVIDER_SITE_OTHER): Payer: Medicaid Other

## 2023-03-09 VITALS — BP 107/71 | HR 100 | Wt 216.0 lb

## 2023-03-09 DIAGNOSIS — O0993 Supervision of high risk pregnancy, unspecified, third trimester: Secondary | ICD-10-CM | POA: Diagnosis not present

## 2023-03-09 DIAGNOSIS — L299 Pruritus, unspecified: Secondary | ICD-10-CM

## 2023-03-09 DIAGNOSIS — Z3A36 36 weeks gestation of pregnancy: Secondary | ICD-10-CM

## 2023-03-09 NOTE — Assessment & Plan Note (Addendum)
GC/Ct and GBS screening swab with susceptibility (penicillin allergy) collected today. States that her penicillin allergy was as a child and very mild. She has taken amoxicillin to treat infections several times without issue. Provided resources for encouraging timely labor. Reviewed labor warning signs and expectations for birth. Instructed to call office or come to hospital with persistent headache, vision changes, regular contractions, leaking of fluid, decreased fetal movement or vaginal bleeding.

## 2023-03-09 NOTE — Assessment & Plan Note (Signed)
States that she has started having some itching in her hands and fingers and also some on her back in the last week. No rash associated with itching. Reviewed possibility of ICP, labs collected today.

## 2023-03-09 NOTE — Progress Notes (Signed)
    Return Prenatal Note   Assessment/Plan   Plan  27 y.o. G2P0010 at [redacted]w[redacted]d presents for follow-up OB visit. Reviewed prenatal record including previous visit note.  Pregnancy, supervision, high-risk GC/Ct and GBS screening swab with susceptibility (penicillin allergy) collected today. States that her penicillin allergy was as a child and very mild. She has taken amoxicillin to treat infections several times without issue. Provided resources for encouraging timely labor. Reviewed labor warning signs and expectations for birth. Instructed to call office or come to hospital with persistent headache, vision changes, regular contractions, leaking of fluid, decreased fetal movement or vaginal bleeding.   Pruritus States that she has started having some itching in her hands and fingers and also some on her back in the last week. No rash associated with itching. Reviewed possibility of ICP, labs collected today.    Orders Placed This Encounter  Procedures   Culture, Grp B Strep w/Rflx Suscept   Bile acids, total   AST   ALT   Return in about 1 week (around 03/16/2023) for ROB.   No future appointments.  For next visit:  continue with routine prenatal care     Subjective   27 y.o. G2P0010 at [redacted]w[redacted]d presents for this follow-up prenatal visit.  Patient reports new onset of itching in fingers and hand, sometimes back.  Patient reports: Movement: Present Contractions: Not present  Objective   Flow sheet Vitals: Pulse Rate: 100 BP: 107/71 Fundal Height: 37 cm Fetal Heart Rate (bpm): 140 Presentation: Vertex Total weight gain: 41 lb (18.6 kg)  General Appearance  No acute distress, well appearing, and well nourished Pulmonary   Normal work of breathing Neurologic   Alert and oriented to person, place, and time Psychiatric   Mood and affect within normal limits  Lindalou Hose Nikeia Henkes, CNM  07/23/248:30 AM

## 2023-03-10 LAB — CERVICOVAGINAL ANCILLARY ONLY
Chlamydia: NEGATIVE
Comment: NEGATIVE
Comment: NORMAL
Neisseria Gonorrhea: NEGATIVE

## 2023-03-10 LAB — ALT: ALT: 8 IU/L (ref 0–32)

## 2023-03-10 LAB — AST: AST: 9 IU/L (ref 0–40)

## 2023-03-10 LAB — BILE ACIDS, TOTAL: Bile Acids Total: 6 umol/L (ref 0.0–10.0)

## 2023-03-16 ENCOUNTER — Encounter: Payer: Self-pay | Admitting: Licensed Practical Nurse

## 2023-03-16 ENCOUNTER — Ambulatory Visit (INDEPENDENT_AMBULATORY_CARE_PROVIDER_SITE_OTHER): Payer: Medicaid Other | Admitting: Licensed Practical Nurse

## 2023-03-16 VITALS — BP 105/67 | HR 89 | Wt 221.9 lb

## 2023-03-16 DIAGNOSIS — Z3A37 37 weeks gestation of pregnancy: Secondary | ICD-10-CM

## 2023-03-16 DIAGNOSIS — O0993 Supervision of high risk pregnancy, unspecified, third trimester: Secondary | ICD-10-CM

## 2023-03-16 LAB — POCT URINALYSIS DIPSTICK
Bilirubin, UA: NEGATIVE
Blood, UA: NEGATIVE
Glucose, UA: NEGATIVE
Ketones, UA: NEGATIVE
Leukocytes, UA: NEGATIVE
Nitrite, UA: NEGATIVE
Protein, UA: NEGATIVE
Spec Grav, UA: 1.02 (ref 1.010–1.025)
Urobilinogen, UA: 0.2 E.U./dL
pH, UA: 6.5 (ref 5.0–8.0)

## 2023-03-16 NOTE — Progress Notes (Signed)
Routine Prenatal Care Visit  Subjective  Emily Hernandez is a 27 y.o. G2P0010 at [redacted]w[redacted]d being seen today for ongoing prenatal care.  She is currently monitored for the following issues for this high-risk pregnancy and has Idiopathic intracranial hypertension; Hypertrophy of breast; Pregnancy, supervision, high-risk; Obesity in pregnancy; Need for rhogam due to Rh negative mother; ADD (attention deficit disorder); Bipolar disorder (HCC); History of migraine; Pregnancy; Decreased fetal movements in second trimester; LGSIL on Pap smear of cervix; and Pruritus on their problem list.  ----------------------------------------------------------------------------------- Patient reports feeling miserable, not sleeping well d/t discomforts and frequent urination. Has a lot of pelvis pain. Has B-H cocontraction while at work, occasionally uses a support belt. Comfort measures reviewed.  -has noticed an increased heart rate at night, pulse goes to low 100's, does not have any other symptoms, reassured of normal finding in pregnancy, please be seen if you develop SOB, chest pain, profuse sweating.  -TWG 46lbs, pt reports she is eating "normally" BMI now 39 aware IOL may be recommended if >40 -Her partner will be her support person, they have family for PP support.   Contractions: Irritability. Vag. Bleeding: None.  Movement: Present. Leaking Fluid denies.  ----------------------------------------------------------------------------------- The following portions of the patient's history were reviewed and updated as appropriate: allergies, current medications, past family history, past medical history, past social history, past surgical history and problem list. Problem list updated.  Objective  Blood pressure 105/67, pulse 89, weight 221 lb 14.4 oz (100.7 kg). Pregravid weight 175 lb (79.4 kg) Total Weight Gain 46 lb 14.4 oz (21.3 kg) Urinalysis: Urine Protein    Urine Glucose    Fetal Status: Fetal Heart  Rate (bpm): 138 Fundal Height:  (38.5) Movement: Present  Presentation: Vertex  General:  Alert, oriented and cooperative. Patient is in no acute distress.  Skin: Skin is warm and dry. No rash noted.   Cardiovascular: Normal heart rate noted  Respiratory: Normal respiratory effort, no problems with respiration noted  Abdomen: Soft, gravid, appropriate for gestational age. Pain/Pressure: Present     Pelvic:  Cervical exam deferred        Extremities: Normal range of motion.  Edema: None  Mental Status: Normal mood and affect. Normal behavior. Normal judgment and thought content.   Assessment   27 y.o. G2P0010 at [redacted]w[redacted]d by  04/02/2023, by Ultrasound presenting for routine prenatal visit  Plan   second Problems (from 08/24/22 to present)     Problem Noted Resolved   Pruritus 03/09/2023 by Burney Gauze, CNM No   Overview Signed 03/09/2023  8:28 AM by Burney Gauze, CNM    Itching on hands starting at 36 wks, ICP labs collected      Pregnancy, supervision, high-risk 08/24/2022 by Loran Senters, CMA No   Overview Addendum 01/26/2023  2:47 PM by Hildred Laser, MD     Clinical Staff Provider  Office Location  [facility] Dating  04/02/2023, by Ultrasound  Language  English Anatomy US    Flu Vaccine  Declines Genetic Screen  NIPS: MaterniT21 nml. Female  TDaP vaccine    Hgb A1C or  GTT Early : Third trimester : 119  Covid    LAB RESULTS   Rhogam  O/Weak D/-- (01/29 9562)  Blood Type O/Weak D/-- (01/29 0910)   Feeding Plan formula Antibody Negative (01/29 0910)  Contraception Depo Provera Rubella 2.88 (01/29 0910)  Circumcision Yes RPR Non Reactive (01/29 0910)   Pediatrician  [facility] HBsAg Negative (01/29 0910)   Support  Person Fredricka Bonine HIV Non Reactive (01/29 0910)  Prenatal Classes Yes Varicella Immune    GBS  (For PCN allergy, check sensitivities)   BTL Consent  Hep C Non Reactive (01/29 0910)   VBAC Consent  Pap Diagnosis  Date Value Ref Range Status  02/19/2022 - Low grade  squamous intraepithelial lesion (LSIL) (A)  Final      Hgb Electro      CF      SMA                     Term labor symptoms and general obstetric precautions including but not limited to vaginal bleeding, contractions, leaking of fluid and fetal movement were reviewed in detail with the patient. Please refer to After Visit Summary for other counseling recommendations.   Return in about 1 week (around 03/23/2023) for ROB.  Carie Caddy, CNM  [facility]  03/16/23  8:50 AM

## 2023-03-24 ENCOUNTER — Encounter: Payer: Self-pay | Admitting: Obstetrics and Gynecology

## 2023-03-24 ENCOUNTER — Ambulatory Visit (INDEPENDENT_AMBULATORY_CARE_PROVIDER_SITE_OTHER): Payer: Medicaid Other | Admitting: Obstetrics and Gynecology

## 2023-03-24 VITALS — BP 113/78 | HR 104 | Wt 226.9 lb

## 2023-03-24 DIAGNOSIS — E669 Obesity, unspecified: Secondary | ICD-10-CM

## 2023-03-24 DIAGNOSIS — Z3490 Encounter for supervision of normal pregnancy, unspecified, unspecified trimester: Secondary | ICD-10-CM

## 2023-03-24 DIAGNOSIS — O99210 Obesity complicating pregnancy, unspecified trimester: Secondary | ICD-10-CM

## 2023-03-24 DIAGNOSIS — O10013 Pre-existing essential hypertension complicating pregnancy, third trimester: Secondary | ICD-10-CM

## 2023-03-24 DIAGNOSIS — O99213 Obesity complicating pregnancy, third trimester: Secondary | ICD-10-CM

## 2023-03-24 DIAGNOSIS — O0993 Supervision of high risk pregnancy, unspecified, third trimester: Secondary | ICD-10-CM

## 2023-03-24 DIAGNOSIS — Z3A38 38 weeks gestation of pregnancy: Secondary | ICD-10-CM

## 2023-03-24 LAB — POCT URINALYSIS DIPSTICK OB
Bilirubin, UA: NEGATIVE
Blood, UA: NEGATIVE
Ketones, UA: NEGATIVE
Leukocytes, UA: NEGATIVE
Nitrite, UA: NEGATIVE
Spec Grav, UA: 1.025 (ref 1.010–1.025)
Urobilinogen, UA: 0.2 E.U./dL
pH, UA: 6 (ref 5.0–8.0)

## 2023-03-24 NOTE — Progress Notes (Signed)
ROB [redacted]w[redacted]d: She is doing well. She reports good fetal movement and lots of pain and pressure. She has no new concerns today.

## 2023-03-24 NOTE — Patient Instructions (Addendum)
  Instructions for Scheduled Procedure (Inductions/C-sections and GYN surgeries)   Thank you for choosing [facility] for your services.  You have been scheduled for a procedure called ______Induction of Labor_________  Your procedure is scheduled on ____8/14/2024 at midnight (come in the night before on 8/13 at 11:55 pm)______.  Upon your scheduled procedure date, you will need to arrive at the Emergency Room entrance. (There is a statue at the front of this entrance.)   Please arrive on time if you are scheduled for an induction of labor.   If you are scheduled for a Cesarean delivery or for Gyn Surgery, arrive 2 hours prior to your procedure time.  If you are an Obstetric patient please arrive no more than 10 minutes before your scheduled procedure.  A staff member will meet you at the [facility] entrance.  Please remember that if you are an elective induction (scheduled without a medical reason), there is a possibility that your induction may be moved to accommodate more critical patients.   At this time, patients are allowed 2 support persons to accompany them. Your support person(s) are now allowed to be there with you during the entire time of your admission.    Please contact the office if you have any questions regarding this information.  The [facility] office number is [phone].     Thank you,    Your  OB/GYN Providers

## 2023-03-24 NOTE — Progress Notes (Addendum)
ROB: Patient is a 27 y.o. G2P0010 at [redacted]w[redacted]d who presents for routine OB care.  The patient has Idiopathic intracranial hypertension; Hypertrophy of breast; Pregnancy, supervision, high-risk; Obesity in pregnancy; Need for rhogam due to Rh negative mother; ADD (attention deficit disorder); Bipolar disorder (HCC); History of migraine; LGSIL on Pap smear of cervix; and Pruritus on their problem list.. Patient has complaints of pelvic pressure. Notes she is ready to be done with the pregnancy.  Discussed IOL for BMI as now at 40.  Patient notes she is ok with induction.  Scheduled for 8/14 at midnight.   Reviewed induction process.  Discussed labor precautions.

## 2023-03-25 ENCOUNTER — Encounter: Payer: Self-pay | Admitting: Obstetrics and Gynecology

## 2023-03-31 ENCOUNTER — Other Ambulatory Visit: Payer: Self-pay

## 2023-03-31 ENCOUNTER — Inpatient Hospital Stay
Admission: EM | Admit: 2023-03-31 | Discharge: 2023-04-02 | DRG: 807 | Disposition: A | Discharge status: Home / Self Care | Payer: Medicaid Other | Attending: Certified Nurse Midwife | Admitting: Certified Nurse Midwife

## 2023-03-31 ENCOUNTER — Encounter: Payer: Self-pay | Admitting: Obstetrics and Gynecology

## 2023-03-31 ENCOUNTER — Inpatient Hospital Stay: Payer: Medicaid Other | Admitting: Anesthesiology

## 2023-03-31 DIAGNOSIS — O99214 Obesity complicating childbirth: Principal | ICD-10-CM | POA: Diagnosis present

## 2023-03-31 DIAGNOSIS — O0993 Supervision of high risk pregnancy, unspecified, third trimester: Secondary | ICD-10-CM

## 2023-03-31 DIAGNOSIS — E669 Obesity, unspecified: Secondary | ICD-10-CM | POA: Diagnosis not present

## 2023-03-31 DIAGNOSIS — Z3A39 39 weeks gestation of pregnancy: Secondary | ICD-10-CM

## 2023-03-31 DIAGNOSIS — L299 Pruritus, unspecified: Principal | ICD-10-CM

## 2023-03-31 DIAGNOSIS — O9081 Anemia of the puerperium: Secondary | ICD-10-CM | POA: Diagnosis not present

## 2023-03-31 DIAGNOSIS — Z3490 Encounter for supervision of normal pregnancy, unspecified, unspecified trimester: Secondary | ICD-10-CM

## 2023-03-31 DIAGNOSIS — O99210 Obesity complicating pregnancy, unspecified trimester: Secondary | ICD-10-CM

## 2023-03-31 DIAGNOSIS — O99213 Obesity complicating pregnancy, third trimester: Secondary | ICD-10-CM | POA: Diagnosis present

## 2023-03-31 LAB — CBC
HCT: 37.2 % (ref 36.0–46.0)
Hemoglobin: 12.1 g/dL (ref 12.0–15.0)
MCH: 27.5 pg (ref 26.0–34.0)
MCHC: 32.5 g/dL (ref 30.0–36.0)
MCV: 84.5 fL (ref 80.0–100.0)
Platelets: 226 10*3/uL (ref 150–400)
RBC: 4.4 MIL/uL (ref 3.87–5.11)
RDW: 14 % (ref 11.5–15.5)
WBC: 9.7 10*3/uL (ref 4.0–10.5)
nRBC: 0 % (ref 0.0–0.2)

## 2023-03-31 LAB — TYPE AND SCREEN
ABO/RH(D): O POS
Antibody Screen: NEGATIVE

## 2023-03-31 LAB — RPR: RPR Ser Ql: NONREACTIVE

## 2023-03-31 LAB — ABO/RH: ABO/RH(D): O POS

## 2023-03-31 MED ORDER — MISOPROSTOL 25 MCG QUARTER TABLET: 50.0000 ug | ORAL_TABLET | ORAL | Status: DC | PRN

## 2023-03-31 MED ORDER — FENTANYL CITRATE (PF) 100 MCG/2ML IJ SOLN
50.0000 ug | INTRAMUSCULAR | Status: DC | PRN
  Administered 2023-03-31: 100 ug via INTRAVENOUS
  Filled 2023-03-31 (×2): qty 2

## 2023-03-31 MED ORDER — TERBUTALINE SULFATE 1 MG/ML IJ SOLN: 0.2500 mg | Freq: Once | INTRAMUSCULAR | Status: DC | PRN

## 2023-03-31 MED ORDER — LIDOCAINE-EPINEPHRINE (PF) 1.5 %-1:200000 IJ SOLN
INTRAMUSCULAR | Status: DC | PRN
  Administered 2023-03-31: 3 mL via EPIDURAL

## 2023-03-31 MED ORDER — BUPIVACAINE HCL (PF) 0.25 % IJ SOLN
INTRAMUSCULAR | Status: DC | PRN
  Administered 2023-03-31: 2 mL via EPIDURAL
  Administered 2023-03-31: 4 mL via EPIDURAL

## 2023-03-31 MED ORDER — OXYTOCIN-SODIUM CHLORIDE 30-0.9 UT/500ML-% IV SOLN
2.5000 [IU]/h | INTRAVENOUS | Status: DC
  Administered 2023-04-01: 2.5 [IU]/h via INTRAVENOUS
  Filled 2023-03-31 (×2): qty 500

## 2023-03-31 MED ORDER — LACTATED RINGERS IV BOLUS
1000.0000 mL | Freq: Once | INTRAVENOUS | Status: AC
  Administered 2023-03-31: 1000 mL via INTRAVENOUS

## 2023-03-31 MED ORDER — OXYTOCIN 10 UNIT/ML IJ SOLN
INTRAMUSCULAR | Status: AC
  Filled 2023-03-31: qty 2

## 2023-03-31 MED ORDER — MISOPROSTOL 25 MCG QUARTER TABLET
25.0000 ug | ORAL_TABLET | Freq: Once | ORAL | Status: AC
  Administered 2023-03-31: 25 ug via ORAL
  Filled 2023-03-31: qty 1

## 2023-03-31 MED ORDER — LACTATED RINGERS IV SOLN
500.0000 mL | Freq: Once | INTRAVENOUS | Status: AC
  Administered 2023-03-31: 500 mL via INTRAVENOUS

## 2023-03-31 MED ORDER — OXYTOCIN BOLUS FROM INFUSION
333.0000 mL | Freq: Once | INTRAVENOUS | Status: AC
  Administered 2023-04-01: 333 mL via INTRAVENOUS

## 2023-03-31 MED ORDER — OXYCODONE-ACETAMINOPHEN 5-325 MG PO TABS: 2.0000 | ORAL_TABLET | ORAL | Status: DC | PRN

## 2023-03-31 MED ORDER — PHENYLEPHRINE 80 MCG/ML (10ML) SYRINGE FOR IV PUSH (FOR BLOOD PRESSURE SUPPORT): 80.0000 ug | PREFILLED_SYRINGE | INTRAVENOUS | Status: DC | PRN

## 2023-03-31 MED ORDER — OXYCODONE-ACETAMINOPHEN 5-325 MG PO TABS: 1.0000 | ORAL_TABLET | ORAL | Status: DC | PRN

## 2023-03-31 MED ORDER — MISOPROSTOL 50MCG HALF TABLET
50.0000 ug | ORAL_TABLET | Freq: Once | ORAL | Status: DC
  Filled 2023-03-31: qty 1

## 2023-03-31 MED ORDER — EPHEDRINE 5 MG/ML INJ: 10.0000 mg | INTRAVENOUS | Status: DC | PRN

## 2023-03-31 MED ORDER — MISOPROSTOL 200 MCG PO TABS
ORAL_TABLET | ORAL | Status: AC
  Administered 2023-03-31: 50 ug via VAGINAL
  Filled 2023-03-31: qty 4

## 2023-03-31 MED ORDER — ONDANSETRON HCL 4 MG/2ML IJ SOLN
4.0000 mg | Freq: Four times a day (QID) | INTRAMUSCULAR | Status: DC | PRN
  Administered 2023-03-31 – 2023-04-01 (×2): 4 mg via INTRAVENOUS
  Filled 2023-03-31 (×2): qty 2

## 2023-03-31 MED ORDER — LACTATED RINGERS IV SOLN: INTRAVENOUS | Status: DC

## 2023-03-31 MED ORDER — ACETAMINOPHEN 325 MG PO TABS: 650.0000 mg | ORAL_TABLET | ORAL | Status: DC | PRN

## 2023-03-31 MED ORDER — LIDOCAINE HCL (PF) 1 % IJ SOLN
INTRAMUSCULAR | Status: DC | PRN
  Administered 2023-03-31: 3 mL via SUBCUTANEOUS

## 2023-03-31 MED ORDER — TERBUTALINE SULFATE 1 MG/ML IJ SOLN
0.2500 mg | Freq: Once | INTRAMUSCULAR | Status: DC | PRN
  Filled 2023-03-31: qty 1

## 2023-03-31 MED ORDER — MISOPROSTOL 50MCG HALF TABLET: 50.0000 ug | ORAL_TABLET | Freq: Once | ORAL | Status: AC

## 2023-03-31 MED ORDER — LIDOCAINE HCL (PF) 1 % IJ SOLN
30.0000 mL | INTRAMUSCULAR | Status: DC | PRN
  Filled 2023-03-31: qty 30

## 2023-03-31 MED ORDER — OXYTOCIN-SODIUM CHLORIDE 30-0.9 UT/500ML-% IV SOLN
1.0000 m[IU]/min | INTRAVENOUS | Status: DC
  Administered 2023-03-31: 2 m[IU]/min via INTRAVENOUS

## 2023-03-31 MED ORDER — FENTANYL-BUPIVACAINE-NACL 0.5-0.125-0.9 MG/250ML-% EP SOLN
12.0000 mL/h | EPIDURAL | Status: DC | PRN
  Administered 2023-03-31: 12 mL/h via EPIDURAL
  Administered 2023-04-01: 10 mL/h via EPIDURAL
  Filled 2023-03-31 (×2): qty 250

## 2023-03-31 MED ORDER — DIPHENHYDRAMINE HCL 50 MG/ML IJ SOLN
12.5000 mg | INTRAMUSCULAR | Status: DC | PRN
  Administered 2023-04-01: 12.5 mg via INTRAVENOUS
  Filled 2023-03-31: qty 1

## 2023-03-31 MED ORDER — AMMONIA AROMATIC IN INHA
RESPIRATORY_TRACT | Status: AC
  Filled 2023-03-31: qty 10

## 2023-03-31 MED ORDER — SOD CITRATE-CITRIC ACID 500-334 MG/5ML PO SOLN: 30.0000 mL | ORAL | Status: DC | PRN

## 2023-03-31 MED ORDER — LACTATED RINGERS IV SOLN: 500.0000 mL | INTRAVENOUS | Status: DC | PRN

## 2023-03-31 NOTE — Anesthesia Preprocedure Evaluation (Signed)
Anesthesia Evaluation  Patient identified by MRN, date of birth, ID band Patient awake    Reviewed: Allergy & Precautions, H&P , NPO status   Airway Mallampati: II       Dental no notable dental hx.    Pulmonary former smoker   Pulmonary exam normal        Cardiovascular Normal cardiovascular exam     Neuro/Psych  PSYCHIATRIC DISORDERS  Depression Bipolar Disorder   negative neurological ROS     GI/Hepatic negative GI ROS, Neg liver ROS,,,  Endo/Other  negative endocrine ROS    Renal/GU negative Renal ROS  negative genitourinary   Musculoskeletal   Abdominal   Peds  Hematology negative hematology ROS (+)   Anesthesia Other Findings   Reproductive/Obstetrics (+) Pregnancy                             Anesthesia Physical Anesthesia Plan  ASA: 2  Anesthesia Plan: Epidural   Post-op Pain Management:    Induction:   PONV Risk Score and Plan:   Airway Management Planned:   Additional Equipment:   Intra-op Plan:   Post-operative Plan:   Informed Consent: I have reviewed the patients History and Physical, chart, labs and discussed the procedure including the risks, benefits and alternatives for the proposed anesthesia with the patient or authorized representative who has indicated his/her understanding and acceptance.     Dental Advisory Given  Plan Discussed with: Anesthesiologist and CRNA  Anesthesia Plan Comments:        Anesthesia Quick Evaluation

## 2023-03-31 NOTE — Progress Notes (Signed)
Emily Hernandez is a 27 y.o. G2P0010 at [redacted]w[redacted]d by ultrasound admitted for induction of labor due to term and high BMI..  Subjective: She is comfortable. Unaware when she is contracting. It has been challenging picking up her contraction pattern with external monitoring.   Objective: BP 137/87   Pulse (!) 102   Temp 98.3 F (36.8 C) (Oral)   Resp 17   Ht 5\' 3"  (1.6 m)   Wt 102.5 kg   LMP  (LMP Unknown)   SpO2 100%   BMI 40.03 kg/m  I/O last 3 completed shifts: In: 2526.2 [I.V.:2469.4; Other:56.8] Out: -  Total I/O In: -  Out: 200 [Urine:200]  FHT:  FHR: 120 baseline bpm, variability: moderate,  accelerations:  Present,  decelerations:  Absent UC:   IUPC placed to better trace UC pattern. SVE:   Dilation: 9, with thick anterior lip. Likely OP Effacement (%): 80 Station: -1 Exam by::M [employer] CNM  Labs: Lab Results  Component Value Date   WBC 9.7 03/31/2023   HGB 12.1 03/31/2023   HCT 37.2 03/31/2023   MCV 84.5 03/31/2023   PLT 226 03/31/2023    Assessment / Plan: Ongoing IOL under pitocin infusion. + progress. Suspect baby is direct OP. Will continue using "spinning Babies" techniques to facilitate internal rotation from OP.  IUPC placed per RN request to better trace contraction pattern.   Fetal Wellbeing:  Category I Pain Control:  Epidural I/D:  n/a Anticipated MOD:  NSVD  Mirna Mires, CNM 03/31/2023, 11:38 PM

## 2023-03-31 NOTE — Progress Notes (Signed)
Emily Hernandez is a 27 y.o. G2P0010 at [redacted]w[redacted]d by ultrasound admitted for induction of labor due to high BMI.  Subjective:She is resting comfortably. Had a Cooke catheter placed earlier this morning that fell out at 0830. Feeling some mild contractions.   Objective: BP 118/73   Pulse 99   Temp 97.9 F (36.6 C) (Oral)   Resp 17   Ht 5\' 3"  (1.6 m)   Wt 102.5 kg   LMP  (LMP Unknown)   SpO2 100%   BMI 40.03 kg/m  No intake/output data recorded. Total I/O In: 2526.2 [I.V.:2469.4; Other:56.8] Out: -   FHT:  FHR: 135 nbaseline bpm, variability: moderate,  accelerations:  Present,  decelerations:  Absent UC:   regular, every 2-2.5  minutes SVE:   Dilation: 3-4 Effacement (%): 60 Station: -3,  Exam by:: Emily Hernandez, CNM  Labs: Lab Results  Component Value Date   WBC 9.7 03/31/2023   HGB 12.1 03/31/2023   HCT 37.2 03/31/2023   MCV 84.5 03/31/2023   PLT 226 03/31/2023    Assessment / Plan: Induction of labor for high BMI  Labor:  + cervical change with Emily Hernandez catheter. Will now continue with pitocin infusion per protocol  Fetal Wellbeing:  Category I Pain Control:  Labor support without medications I/D:  n/a Anticipated MOD:  NSVD  Emily Hernandez, CNM 03/31/2023, 6:35 PM

## 2023-03-31 NOTE — Anesthesia Procedure Notes (Signed)
Epidural Patient location during procedure: OB Start time: 03/31/2023 1:17 PM End time: 03/31/2023 1:23 PM  Staffing Anesthesiologist: Piscitello, Cleda Mccreedy, MD Resident/CRNA: Stormy Fabian, CRNA Performed: resident/CRNA   Preanesthetic Checklist Completed: patient identified, IV checked, site marked, risks and benefits discussed, surgical consent, monitors and equipment checked, pre-op evaluation and timeout performed  Epidural Patient position: sitting Prep: ChloraPrep Patient monitoring: heart rate, continuous pulse ox and blood pressure Approach: midline Location: L4-L5 Injection technique: LOR saline  Needle:  Needle type: Tuohy  Needle gauge: 18 G Needle length: 9 cm and 9 Needle insertion depth: 7 cm Catheter type: closed end flexible Catheter size: 20 Guage Catheter at skin depth: 12 cm Test dose: negative and 1.5% lidocaine with Epi 1:200 K  Assessment Sensory level: T10 Events: blood not aspirated, no cerebrospinal fluid, injection not painful, no injection resistance, no paresthesia and negative IV test  Additional Notes   Patient tolerated the insertion well without complications.Reason for block:procedure for pain

## 2023-03-31 NOTE — Progress Notes (Signed)
Emily Hernandez is a 27 y.o. G2P0010 continuing under labor induction.  Subjective: she is comfortable. Hs been repositioned from side to side to facilitate optimal fetal positioning.   Objective: BP 118/73   Pulse 99   Temp 97.9 F (36.6 C) (Oral)   Resp 17   Ht 5\' 3"  (1.6 m)   Wt 102.5 kg   LMP  (LMP Unknown)   SpO2 100%   BMI 40.03 kg/m  I/O last 3 completed shifts: In: 2526.2 [I.V.:2469.4; Other:56.8] Out: -  No intake/output data recorded.  FHT:  FHR: 130 bpm, variability: moderate,  accelerations:  Present,  decelerations:  Absent UC:   difficulty tracing these due to body habitus.. Q 2-3 minutes SVE:   Dilation: 5 Effacement (%): 70 Station: -3, -2 Exam by:: Eunice Blase, CNM  Labs: Lab Results  Component Value Date   WBC 9.7 03/31/2023   HGB 12.1 03/31/2023   HCT 37.2 03/31/2023   MCV 84.5 03/31/2023   PLT 226 03/31/2023    Assessment / Plan: Induction of labor due to high BMI,  progressing well on pitocin  Labor:  Will recheck in several hours.  Fetal Wellbeing:  Category I Pain Control:  Epidural I/D:  n/a Anticipated MOD:  NSVD  Mirna Mires, CNM 03/31/2023, 7:03 PM

## 2023-03-31 NOTE — Progress Notes (Signed)
LABOR NOTE   SUBJECTIVE:   Emily Hernandez is a 27 y.o.  G2P0010  at [redacted]w[redacted]d whose labor is being induced for BMI>40. She has received one dose of misoprostol and is starting to feel uncomfortable contractions. We discussed placing a Cook catheter, and she is in agreement with the plan.   Analgesia: Labor support without medications  OBJECTIVE:  BP 109/73 (BP Location: Left Arm)   Pulse 94   Temp 98.6 F (37 C) (Oral)   Resp 18   Ht 5\' 3"  (1.6 m)   Wt 102.5 kg   LMP  (LMP Unknown)   BMI 40.03 kg/m  No intake/output data recorded.  SVE:   Dilation: 1.5 Effacement (%): 60 Station: -2 Exam by:: [place] Guptill RN CONTRACTIONS: regular, every 2-3 minutes FHR: Fetal heart tracing reviewed. Baseline: 125 Variability: moderate Accelerations: present Decelerations:none Category 1  Labs: Lab Results  Component Value Date   WBC 9.7 03/31/2023   HGB 12.1 03/31/2023   HCT 37.2 03/31/2023   MCV 84.5 03/31/2023   PLT 226 03/31/2023    ASSESSMENT:    Induction of labor for elevated BMI , cervical ripening     Coping well     Membranes: intact     Category 1 tracing  Principal Problem:   Obesity complicating pregnancy in third trimester   PLAN: Cook catheter placed Will start Pitocin for continued cervical ripening Anticipate NSVD  Guadlupe Spanish, CNM 03/31/2023 5:55 AM

## 2023-03-31 NOTE — H&P (Cosign Needed Addendum)
History and Physical   HPI  Emily Hernandez is a 27 y.o. G2P0010 at [redacted]w[redacted]d Estimated Date of Delivery: 04/02/23 who is being admitted for induction of labor for elevated BMI. She denies LOF and vaginal bleeding. She endorses good fetal movement. She has received an initial dose of misoprostol and is feeling mild contractions.  OB History  OB History  Gravida Para Term Preterm AB Living  2 0 0 0 1 0  SAB IAB Ectopic Multiple Live Births  0 1 0 0 0    # Outcome Date GA Lbr Len/2nd Weight Sex Type Anes PTL Lv  2 Current           1 IAB 03/2018     TAB       Obstetric Comments  Pt had elective abortion, date unknown.    PROBLEM LIST  Pregnancy complications or risks: Patient Active Problem List   Diagnosis Date Noted   Obesity complicating pregnancy in third trimester 03/31/2023   Pruritus 03/09/2023   ADD (attention deficit disorder) 10/20/2022   Bipolar disorder (HCC) 10/20/2022   History of migraine 10/20/2022   Obesity in pregnancy 09/21/2022   Need for rhogam due to Rh negative mother 09/21/2022   Pregnancy, supervision, high-risk 08/24/2022   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   Hypertrophy of breast    Idiopathic intracranial hypertension 02/20/2014    Prenatal labs and studies: ABO, Rh: --/--/PENDING (08/14 0116) Antibody: PENDING (08/14 0116) Rubella: 2.88 (01/29 0910) RPR: Non Reactive (05/28 0914)  HBsAg: Negative (01/29 0910)  HIV: Non Reactive (05/28 0914)  JYN:WGNFAOZH/-- (07/23 0931)   Past Medical History:  Diagnosis Date   Bipolar disorder (HCC)    Cold sore    Depression    Dysplastic nevus 10/15/2020   Left upper back, severe. Exc 02/03/21 2:30pm.   Idiopathic intracranial hypertension    Vaccine for human papilloma virus (HPV) types 6, 11, 16, and 18 administered      Past Surgical History:  Procedure Laterality Date   BREAST REDUCTION SURGERY Bilateral 01/13/2022   Procedure: MAMMARY REDUCTION  (BREAST);  Surgeon: Janne Napoleon,  MD;  Location: [facility] OR;  Service: Plastics;  Laterality: Bilateral;   none     WISDOM TOOTH EXTRACTION       Medications    Current Discharge Medication List     CONTINUE these medications which have NOT CHANGED   Details  Prenatal Vit-Fe Fumarate-FA (MULTIVITAMIN-PRENATAL) 27-0.8 MG TABS tablet Take 1 tablet by mouth daily at 12 noon.    Butalbital-APAP-Caffeine 50-325-40 MG capsule Take 1-2 capsules by mouth every 6 (six) hours as needed for headache. Qty: 30 capsule, Refills: 3    valACYclovir (VALTREX) 1000 MG tablet Take 2 tabs BID x 1 day prn sx Qty: 30 tablet, Refills: 1   Associated Diagnoses: Cold sore         Allergies  Penicillins  Review of Systems  Constitutional: negative Respiratory: negative Cardiovascular: negative Gastrointestinal: negative Genitourinary:negative Musculoskeletal:negative Behavioral/Psych: negative  Physical Exam  BP 128/79 (BP Location: Left Arm)   Pulse (!) 103   Temp 97.9 F (36.6 C) (Oral)   Resp 16   LMP  (LMP Unknown)   General: alert, cooperative, NAD Lungs:  CTAB Cardio: RRR without M/R/G Abd: Soft, gravid, NT Presentation: cephalic, confirmed by BSUS   CERVIX: Dilation: 1.5 Effacement (%): 50 Cervical Position: Middle Station: -2 Exam by:: [place] Guptill RN  See Prenatal records for more detailed PE.     FHR:  Baseline: 135 Variability: moderate Accelerations: present Decelerations: none Toco: irregular, every 2-4 minutes Category 1  Test Results  Results for orders placed or performed during the hospital encounter of 03/31/23 (from the past 24 hour(s))  CBC     Status: None   Collection Time: 03/31/23  1:16 AM  Result Value Ref Range   WBC 9.7 4.0 - 10.5 K/uL   RBC 4.40 3.87 - 5.11 MIL/uL   Hemoglobin 12.1 12.0 - 15.0 g/dL   HCT 84.1 32.4 - 40.1 %   MCV 84.5 80.0 - 100.0 fL   MCH 27.5 26.0 - 34.0 pg   MCHC 32.5 30.0 - 36.0 g/dL   RDW 02.7 25.3 - 66.4 %   Platelets 226 150 - 400 K/uL    nRBC 0.0 0.0 - 0.2 %  Type and screen     Status: None (Preliminary result)   Collection Time: 03/31/23  1:16 AM  Result Value Ref Range   ABO/RH(D) PENDING    Antibody Screen PENDING    Sample Expiration      04/03/2023,2359 Performed at [facility], [address] [place], [place] [address]    Group B Strep negative  Assessment   G2P0010 at [redacted]w[redacted]d Estimated Date of Delivery: 04/02/23  Reassuring maternal/fetal status.  Patient Active Problem List   Diagnosis Date Noted   Obesity complicating pregnancy in third trimester 03/31/2023   Pruritus 03/09/2023   ADD (attention deficit disorder) 10/20/2022   Bipolar disorder (HCC) 10/20/2022   History of migraine 10/20/2022   Obesity in pregnancy 09/21/2022   Need for rhogam due to Rh negative mother 09/21/2022   Pregnancy, supervision, high-risk 08/24/2022   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   Hypertrophy of breast    Idiopathic intracranial hypertension 02/20/2014    Plan  1. Admit to L&D :   2. EFM: Continuous -- Category 1 3. Pharmacologic pain relief if desired.   4. Admission labs  5. Cervical ripening with misoprostol and possible Cook catheter placement 6. Anticipate NSVD 7. Dr. Logan Bores notified of admission  Guadlupe Spanish, [facility] 03/31/2023 2:20 AM\

## 2023-03-31 NOTE — Progress Notes (Signed)
Emily Hernandez is a 27 y.o. G2P0010 at [redacted]w[redacted]d who continues with induction of labor. She is being augmented now with pitocin, and is uncomfortable. Has been thinking about getting an epidural Subjective: More uncomfortabel. Has changed positions, sat on the labor ball and is uncomfortable.   Objective: BP 118/73   Pulse 99   Temp 97.9 F (36.6 C) (Oral)   Resp 17   Ht 5\' 3"  (1.6 m)   Wt 102.5 kg   LMP  (LMP Unknown)   SpO2 100%   BMI 40.03 kg/m  No intake/output data recorded. Total I/O In: 2526.2 [I.V.:2469.4; Other:56.8] Out: -   FHT:  FHR: 120-125 bpm, variability: moderate,  accelerations:  Present,  decelerations:  Present infrequent, occasional  brief variable SVE:   Dilation: 5 Effacement (%): 70 Station: -3, -2 Exam by:: Eunice Blase, CNM  Labs: Lab Results  Component Value Date   WBC 9.7 03/31/2023   HGB 12.1 03/31/2023   HCT 37.2 03/31/2023   MCV 84.5 03/31/2023   PLT 226 03/31/2023    Assessment / Plan: Augmentation of labor with pitocin. Some cervical change. She desires epidural.  Labor: Progressing on Pitocin, will continue to increase then AROM  Fetal Wellbeing:  Category II Pain Control:  Labor support without medications I/D:  n/a Anticipated MOD:  NSVD Anesthesia contacted for epidural. Will recheck her in several hours after she rests with epidural.  Mirna Mires, CNM 03/31/2023, 6:49 PM

## 2023-04-01 ENCOUNTER — Encounter: Payer: Self-pay | Admitting: Obstetrics and Gynecology

## 2023-04-01 DIAGNOSIS — E669 Obesity, unspecified: Secondary | ICD-10-CM | POA: Diagnosis not present

## 2023-04-01 DIAGNOSIS — O99214 Obesity complicating childbirth: Principal | ICD-10-CM

## 2023-04-01 DIAGNOSIS — Z3A39 39 weeks gestation of pregnancy: Secondary | ICD-10-CM

## 2023-04-01 MED ORDER — ACETAMINOPHEN 325 MG PO TABS: 650.0000 mg | ORAL_TABLET | ORAL | Status: DC | PRN

## 2023-04-01 MED ORDER — ONDANSETRON HCL 4 MG/2ML IJ SOLN: 4.0000 mg | INTRAMUSCULAR | Status: DC | PRN

## 2023-04-01 MED ORDER — COCONUT OIL OIL: 1.0000 | TOPICAL_OIL | Status: DC | PRN

## 2023-04-01 MED ORDER — IBUPROFEN 600 MG PO TABS
600.0000 mg | ORAL_TABLET | Freq: Four times a day (QID) | ORAL | Status: DC
  Administered 2023-04-01 – 2023-04-02 (×4): 600 mg via ORAL
  Filled 2023-04-01 (×4): qty 1

## 2023-04-01 MED ORDER — DIPHENHYDRAMINE HCL 25 MG PO CAPS: 25.0000 mg | ORAL_CAPSULE | Freq: Four times a day (QID) | ORAL | Status: DC | PRN

## 2023-04-01 MED ORDER — WITCH HAZEL-GLYCERIN EX PADS
1.0000 | MEDICATED_PAD | CUTANEOUS | Status: DC | PRN
  Administered 2023-04-01: 1 via TOPICAL
  Filled 2023-04-01 (×2): qty 100

## 2023-04-01 MED ORDER — OXYCODONE HCL 5 MG PO TABS: 5.0000 mg | ORAL_TABLET | ORAL | Status: DC | PRN

## 2023-04-01 MED ORDER — PRENATAL MULTIVITAMIN CH
1.0000 | ORAL_TABLET | Freq: Every day | ORAL | Status: DC
  Administered 2023-04-02: 1 via ORAL
  Filled 2023-04-01: qty 1

## 2023-04-01 MED ORDER — ZOLPIDEM TARTRATE 5 MG PO TABS: 5.0000 mg | ORAL_TABLET | Freq: Every evening | ORAL | Status: DC | PRN

## 2023-04-01 MED ORDER — SIMETHICONE 80 MG PO CHEW: 80.0000 mg | CHEWABLE_TABLET | ORAL | Status: DC | PRN

## 2023-04-01 MED ORDER — DIBUCAINE (PERIANAL) 1 % EX OINT
1.0000 | TOPICAL_OINTMENT | CUTANEOUS | Status: DC | PRN
  Administered 2023-04-01: 1 via RECTAL
  Filled 2023-04-01 (×2): qty 28

## 2023-04-01 MED ORDER — SENNOSIDES-DOCUSATE SODIUM 8.6-50 MG PO TABS
2.0000 | ORAL_TABLET | Freq: Every day | ORAL | Status: DC
  Administered 2023-04-02: 2 via ORAL
  Filled 2023-04-01: qty 2

## 2023-04-01 MED ORDER — OXYCODONE HCL 5 MG PO TABS: 10.0000 mg | ORAL_TABLET | ORAL | Status: DC | PRN

## 2023-04-01 MED ORDER — BENZOCAINE-MENTHOL 20-0.5 % EX AERO
1.0000 | INHALATION_SPRAY | CUTANEOUS | Status: DC | PRN
  Administered 2023-04-01: 1 via TOPICAL
  Filled 2023-04-01 (×2): qty 56

## 2023-04-01 MED ORDER — TETANUS-DIPHTH-ACELL PERTUSSIS 5-2.5-18.5 LF-MCG/0.5 IM SUSY: 0.5000 mL | PREFILLED_SYRINGE | Freq: Once | INTRAMUSCULAR | Status: DC

## 2023-04-01 MED ORDER — ONDANSETRON HCL 4 MG PO TABS: 4.0000 mg | ORAL_TABLET | ORAL | Status: DC | PRN

## 2023-04-01 NOTE — Progress Notes (Signed)
Emily Hernandez is a 27 y.o. G2P0010 who has continued with induction of labor. Subjective:Emily Hernandez is very comfortable, and her epidural has worked well for her. Numerous positions changes have been utilised throughout the evening, and her pitocin dosing has been increased and adjusted as well. An IUPC placed UCs has been more than adequate.   Objective: BP 120/71   Pulse (!) 115   Temp 99.2 F (37.3 C) (Oral)   Resp 17   Ht 5\' 3"  (1.6 m)   Wt 102.5 kg   LMP  (LMP Unknown)   SpO2 98%   BMI 40.03 kg/m  I/O last 3 completed shifts: In: 2526.2 [I.V.:2469.4; Other:56.8] Out: -  Total I/O In: -  Out: 455 [Urine:455]  FHT:  FHR: 130 baseline, bpm, variability: moderate,  accelerations:  Present,  decelerations:  Absent UC:   regular, every 3 minutes SVE:   Dilation: Lip/rim Effacement (%): 90 Station: -1 Exam by:: Eunice Blase  + molding noted with exam, and anterior lip has been persistent Likely OP Labs: Lab Results  Component Value Date   WBC 9.7 03/31/2023   HGB 12.1 03/31/2023   HCT 37.2 03/31/2023   MCV 84.5 03/31/2023   PLT 226 03/31/2023  Decreased urine output also noted due to foley catheter compression between pubic bone and fetal head.  Assessment / Plan: Arrest in active phase of labor No cervical change in over 4 hours under pitocin with adequate contractions   Fetal Wellbeing:  Category I Pain Control:  Epidural I/D:  n/a Anticipated MOD:   discussed the lack of cervical change over many hours with the patient. Dr. Valentino Saxon contacted via phone and report provided regarding lack of cervical change, adequate contractions and the SVE findings. Dr. Oretha Milch presence requested to evaluate and confirm diagnosis.  Will trasnfer care to MD management should Call for primary CS be made.  Mirna Mires, CNM  04/01/2023 7:12 AM    Mirna Mires, CNM 04/01/2023, 6:50 AM

## 2023-04-01 NOTE — Discharge Instructions (Signed)

## 2023-04-01 NOTE — Discharge Summary (Cosign Needed Addendum)
Postpartum Discharge Summary  Date of Service updated 04/02/2023     Patient Name: Emily Hernandez DOB: [birth year] MRN: [id number]  Date of admission: 03/31/2023 Delivery date:04/01/2023 Delivering provider: Raeford Razor Date of discharge: 04/02/2023  Admitting diagnosis: Obesity complicating pregnancy in third trimester [O99.213] Intrauterine pregnancy: [redacted]w[redacted]d     Secondary diagnosis:  Principal Problem:   Obesity complicating pregnancy in third trimester Active Problems:   Encounter for planned induction of labor  Additional problems: Anemia due to blood loss.     Discharge diagnosis: Term Pregnancy Delivered                                              Post partum procedures: none Augmentation: AROM, Pitocin, Cytotec, and IP Foley Complications: Shoulder dystocia less than one minute, relieved with suprapubic pressure and [facility] course: Onset of Labor With Vaginal Delivery      27 y.o. yo G2P1011 at [redacted]w[redacted]d was admitted for IOL secondary to elevated BMI on 03/31/2023. Labor course was complicated by slow progress/arrest of dilation followed by shoulder dystocia with birth.   Membrane Rupture Time/Date: 5:04 PM,03/31/2023  Delivery Method:Vaginal, Spontaneous Operative Delivery:N/A Episiotomy: None Lacerations:  2nd degree  Patient had a postpartum course complicated by anemia. Pt offered iron infusion but declined.   She is ambulating, tolerating a regular diet, passing flatus, and urinating well. Patient is discharged home in stable condition on 04/02/23.  Newborn Data: Birth date:04/01/2023 Birth time:9:42 AM Gender:Female Living status:Living Apgars:5 ,9  Weight: 8lb 12oz  Magnesium Sulfate received: No BMZ received: No Rhophylac:N/A MMR:N/A T-DaP:received 01/12/23 Flu: N/A Transfusion:No  Physical exam  Vitals:   04/01/23 1919 04/01/23 2318 04/02/23 0339 04/02/23 0757  BP: 109/68 125/70 (!) 92/52 116/63  Pulse: 97 97 97 80  Resp: 18 18 18 20    Temp: 98.3 F (36.8 C) 98.1 F (36.7 C) 98.4 F (36.9 C) 98.4 F (36.9 C)  TempSrc:  Oral Oral Oral  SpO2: 98% 98% 99% 99%  Weight:      Height:       General: alert, cooperative, and no distress Lochia: appropriate Uterine Fundus: firm Incision: N/A DVT Evaluation: No evidence of DVT seen on physical exam. No cords or calf tenderness. No significant calf/ankle edema. Labs: Lab Results  Component Value Date   WBC 13.7 (H) 04/02/2023   HGB 8.7 (L) 04/02/2023   HCT 26.8 (L) 04/02/2023   MCV 84.8 04/02/2023   PLT 186 04/02/2023      Latest Ref Rng & Units 03/09/2023    8:30 AM  CMP  AST 0 - 40 IU/L 9   ALT 0 - 32 IU/L 8    Edinburgh Score:    04/01/2023    7:20 PM  Edinburgh Postnatal Depression Scale Screening Tool  I have been able to laugh and see the funny side of things. 0  I have looked forward with enjoyment to things. 0  I have blamed myself unnecessarily when things went wrong. 1  I have been anxious or worried for no good reason. 0  I have felt scared or panicky for no good reason. 0  Things have been getting on top of me. 0  I have been so unhappy that I have had difficulty sleeping. 0  I have felt sad or miserable. 0  I have been so unhappy that I have  been crying. 1  The thought of harming myself has occurred to me. 0  Edinburgh Postnatal Depression Scale Total 2      After visit meds:  Allergies as of 04/02/2023       Reactions   Penicillins Hives, Swelling, Other (See Comments)   Swelling and Hives        Hives, throat swells        Medication List     STOP taking these medications    Butalbital-APAP-Caffeine 50-325-40 MG capsule       TAKE these medications    Fusion Plus Caps Take 1 capsule by mouth daily at 6 (six) AM.   multivitamin-prenatal 27-0.8 MG Tabs tablet Take 1 tablet by mouth daily at 12 noon.   valACYclovir 1000 MG tablet Commonly known as: VALTREX Take 2 tabs BID x 1 day prn sx         Discharge  home in stable condition Infant Feeding: Bottle Infant Disposition:home with mother Discharge instruction: per After Visit Summary and Postpartum booklet. Activity: Advance as tolerated. Pelvic rest for 6 weeks.  Diet: routine diet Anticipated Birth Control: PP Depo given Postpartum Appointment:2 weeks Additional Postpartum F/U: Postpartum Depression checkup Future Appointments:No future appointments. Follow up Visit:  Follow-up Information     [facility] at [facility] Follow up.   Specialty: Obstetrics and Gynecology Why: call to schedule a two week telehealth visit and six week in person postpartum visit Contact information: [address] [place] [address] [phone]                    04/02/2023 Doreene Burke, CNM

## 2023-04-02 LAB — CBC
HCT: 26.8 % — ABNORMAL LOW (ref 36.0–46.0)
Hemoglobin: 8.7 g/dL — ABNORMAL LOW (ref 12.0–15.0)
MCH: 27.5 pg (ref 26.0–34.0)
MCHC: 32.5 g/dL (ref 30.0–36.0)
MCV: 84.8 fL (ref 80.0–100.0)
Platelets: 186 10*3/uL (ref 150–400)
RBC: 3.16 MIL/uL — ABNORMAL LOW (ref 3.87–5.11)
RDW: 14.2 % (ref 11.5–15.5)
WBC: 13.7 10*3/uL — ABNORMAL HIGH (ref 4.0–10.5)
nRBC: 0 % (ref 0.0–0.2)

## 2023-04-02 MED ORDER — FUSION PLUS PO CAPS: 1.0000 | ORAL_CAPSULE | Freq: Every day | ORAL | 5 refills | Status: DC

## 2023-04-02 MED ORDER — MEDROXYPROGESTERONE ACETATE 150 MG/ML IM SUSP
150.0000 mg | Freq: Once | INTRAMUSCULAR | Status: AC
  Administered 2023-04-02: 150 mg via INTRAMUSCULAR
  Filled 2023-04-02: qty 1

## 2023-04-02 NOTE — Final Progress Note (Signed)
Final Progress Note  Patient ID: Emily Hernandez MRN: [id number] DOB/AGE: 29-15-1997 29 y.o.  Admit date: 03/31/2023 Admitting provider: Hildred Laser, MD Discharge date: 04/02/2023   Admission Diagnoses: induction of labor   Discharge Diagnoses:  Principal Problem:   Obesity complicating pregnancy in third trimester Active Problems:   Encounter for planned induction of labor    Consults: None  Significant Findings/ Diagnostic Studies: GBS negative   Procedures: Epidural  Discharge Condition: good  Disposition: Discharge disposition: 01-Home or Self Care       Diet: Regular diet  Discharge Activity: No lifting, driving, or strenuous exercise for 2 weeks    Allergies as of 04/02/2023       Reactions   Penicillins Hives, Swelling, Other (See Comments)   Swelling and Hives        Hives, throat swells        Medication List     STOP taking these medications    Butalbital-APAP-Caffeine 50-325-40 MG capsule       TAKE these medications    Fusion Plus Caps Take 1 capsule by mouth daily at 6 (six) AM.   multivitamin-prenatal 27-0.8 MG Tabs tablet Take 1 tablet by mouth daily at 12 noon.   valACYclovir 1000 MG tablet Commonly known as: VALTREX Take 2 tabs BID x 1 day prn sx        Follow-up Information     [facility] at [facility] Follow up.   Specialty: Obstetrics and Gynecology Why: call to schedule a two week telehealth visit and six week in person postpartum visit Contact information: [address] [place] [address] [phone]                Total time spent taking care of this patient: 12 minutes  Signed: Doreene Burke 04/02/2023, 8:36 AM

## 2023-04-02 NOTE — Progress Notes (Signed)
Patient discharged. Discharge instructions given. Patient verbalizes understanding. Transported by axillary. 

## 2023-04-02 NOTE — Progress Notes (Signed)
Pt's hemoglobin dropped from 12.1 to 8.7. Pt is not experiencing signs of low iron and states that she feels fine. CNM Janee Morn offered an iron infusion and pt declined.

## 2023-04-06 ENCOUNTER — Telehealth: Payer: Self-pay

## 2023-04-06 NOTE — Telephone Encounter (Signed)
I contacted the patient via phone. She is confirmed for appointment with [facility] at 2:35 pm to follow care.

## 2023-04-06 NOTE — Telephone Encounter (Signed)
Pt calling; delivered last Thurs; has questions about some things that are going on.  [phone]  Pt states both legs and feet are swollen and not getting better; is not breastfeeding; breasts are hard.  Adv pt she can squeeze breast between hands to expell milk over a sink; no stimulation to the breast; wear tight fitting bra 24/7. No fever.  Adv in my opinion she should be seen before her 2wk appt just to stay on top of things and BP check.  Adv pt to watch for a call from schedulers.

## 2023-04-07 ENCOUNTER — Ambulatory Visit (INDEPENDENT_AMBULATORY_CARE_PROVIDER_SITE_OTHER): Payer: Medicaid Other | Admitting: Obstetrics

## 2023-04-07 ENCOUNTER — Ambulatory Visit (INDEPENDENT_AMBULATORY_CARE_PROVIDER_SITE_OTHER): Payer: Medicaid Other | Admitting: Adult Health

## 2023-04-07 ENCOUNTER — Encounter: Payer: Self-pay | Admitting: Obstetrics

## 2023-04-07 ENCOUNTER — Encounter: Payer: Self-pay | Admitting: Adult Health

## 2023-04-07 DIAGNOSIS — F902 Attention-deficit hyperactivity disorder, combined type: Secondary | ICD-10-CM | POA: Diagnosis not present

## 2023-04-07 DIAGNOSIS — F319 Bipolar disorder, unspecified: Secondary | ICD-10-CM | POA: Diagnosis not present

## 2023-04-07 DIAGNOSIS — G4700 Insomnia, unspecified: Secondary | ICD-10-CM

## 2023-04-07 DIAGNOSIS — R600 Localized edema: Secondary | ICD-10-CM

## 2023-04-07 DIAGNOSIS — F411 Generalized anxiety disorder: Secondary | ICD-10-CM

## 2023-04-07 LAB — POCT URINALYSIS DIPSTICK
Bilirubin, UA: NEGATIVE
Glucose, UA: NEGATIVE
Ketones, UA: NEGATIVE
Nitrite, UA: NEGATIVE
Protein, UA: POSITIVE — AB
Spec Grav, UA: 1.02 (ref 1.010–1.025)
Urobilinogen, UA: 0.2 E.U./dL
pH, UA: 6 (ref 5.0–8.0)

## 2023-04-07 MED ORDER — LAMOTRIGINE 25 MG PO TABS
ORAL_TABLET | ORAL | 2 refills | Status: DC
Start: 2023-04-07 — End: 2023-06-02

## 2023-04-07 MED ORDER — TOPIRAMATE 50 MG PO TABS
ORAL_TABLET | ORAL | 2 refills | Status: DC
Start: 2023-04-07 — End: 2023-08-25

## 2023-04-07 NOTE — Progress Notes (Signed)
Emily Hernandez [id number] 10/18/1995 27 y.o.  Subjective:   Patient ID:  Emily Hernandez is a 27 y.o. (DOB [birth year]) female.  Chief Complaint: No chief complaint on file.   HPI SANOVIA SKUTT presents to the office today for follow-up of ADHD, MDD, GAD, insomnia, and Bipolar disorder.   Describes mood today as "ok". Pleasant. Tearful at times. Mood symptoms - reports some anxiety. Denies depression and irritability. Denies panic attacks. Reports some worry, rumination and over thinking. Reports mood as stable. Stating "I feel like I'm doing well for now". She is 1 week postpartum and would like to restart medications. She and boyfriend doing well - together 3 years. Mother local and supportive. Varying interest and motivation. Taking other medications as prescribed. Energy levels lower Active, does not have a regular exercise routine.  Enjoys some usual interests and activities. Dating. Lives with boyfriend. Mother in [place].  Appetite adequate. Weight loss.  Sleeps better some nights than others. Averages 4 to 6 hours during the night and napping some during the day. Focus and concentration stable. Completing tasks. Managing aspects of household. Working 2 days a week. Denies SI or HI. Denies AH or VH. Denies self harm. Denies substance use.   Previous medication trials: Latuda, Abilify, Lamictal, Topamax, Vraylar.  GAD-7    Flowsheet Row Procedure visit from 09/23/2020 in [facility] Office Visit from 08/02/2020 in [facility]  Total GAD-7 Score 15 12      PHQ2-9    Flowsheet Row Procedure visit from 09/23/2020 in [facility] Office Visit from 08/02/2020 in [facility]  PHQ-2 Total Score 4 0  PHQ-9 Total Score 16 9      Flowsheet Row ED to Hosp-Admission (Discharged) from 03/31/2023 in [facility] ED from 08/05/2022 in [facility] Department at [facility] ED from  04/10/2022 in [facility]  C-SSRS RISK CATEGORY No Risk No Risk No Risk        Review of Systems:  Review of Systems  Musculoskeletal:  Negative for gait problem.  Neurological:  Negative for tremors.  Psychiatric/Behavioral:         Please refer to HPI    Medications: I have reviewed the patient's current medications.  Current Outpatient Medications  Medication Sig Dispense Refill   Iron-FA-B Cmp-C-Biot-Probiotic (FUSION PLUS) CAPS Take 1 capsule by mouth daily at 6 (six) AM. 30 capsule 5   Prenatal Vit-Fe Fumarate-FA (MULTIVITAMIN-PRENATAL) 27-0.8 MG TABS tablet Take 1 tablet by mouth daily at 12 noon.     valACYclovir (VALTREX) 1000 MG tablet Take 2 tabs BID x 1 day prn sx (Patient not taking: Reported on 03/31/2023) 30 tablet 1   No current facility-administered medications for this visit.    Medication Side Effects: None  Allergies:  Allergies  Allergen Reactions   Penicillins Hives, Swelling and Other (See Comments)    Swelling and Hives        Hives, throat swells    Past Medical History:  Diagnosis Date   Bipolar disorder (HCC)    Cold sore    Depression    Dysplastic nevus 10/15/2020   Left upper back, severe. Exc 02/03/21 2:30pm.   Idiopathic intracranial hypertension    Vaccine for human papilloma virus (HPV) types 6, 11, 16, and 18 administered     Past Medical History, Surgical history, Social history, and Family history were reviewed and updated as appropriate.   Please see review  of systems for further details on the patient's review from today.   Objective:   Physical Exam:  There were no vitals taken for this visit.  Physical Exam Constitutional:      General: She is not in acute distress. Musculoskeletal:        General: No deformity.  Neurological:     Mental Status: She is alert and oriented to person, place, and time.     Coordination: Coordination normal.  Psychiatric:        Attention and  Perception: Attention and perception normal. She does not perceive auditory or visual hallucinations.        Mood and Affect: Affect is not labile, blunt, angry or inappropriate.        Speech: Speech normal.        Behavior: Behavior normal.        Thought Content: Thought content normal. Thought content is not paranoid or delusional. Thought content does not include homicidal or suicidal ideation. Thought content does not include homicidal or suicidal plan.        Cognition and Memory: Cognition and memory normal.        Judgment: Judgment normal.     Comments: Insight intact     Lab Review:     Component Value Date/Time   NA 139 04/10/2022 1051   K 3.8 04/10/2022 1051   CL 112 (H) 04/10/2022 1051   CO2 23 04/10/2022 1051   GLUCOSE 96 04/10/2022 1051   BUN 12 04/10/2022 1051   CREATININE 0.81 04/10/2022 1051   CREATININE 0.75 01/30/2014 1309   CALCIUM 9.5 04/10/2022 1051   PROT 7.3 04/10/2022 1051   ALBUMIN 4.5 04/10/2022 1051   AST 9 03/09/2023 0830   ALT 8 03/09/2023 0830   ALKPHOS 41 04/10/2022 1051   BILITOT 0.5 04/10/2022 1051   GFRNONAA >60 04/10/2022 1051   GFRAA >60 05/01/2019 1303       Component Value Date/Time   WBC 13.7 (H) 04/02/2023 0418   RBC 3.16 (L) 04/02/2023 0418   HGB 8.7 (L) 04/02/2023 0418   HGB 11.0 (L) 01/12/2023 0914   HCT 26.8 (L) 04/02/2023 0418   HCT 33.8 (L) 01/12/2023 0914   PLT 186 04/02/2023 0418   PLT 178 01/12/2023 0914   MCV 84.8 04/02/2023 0418   MCV 89 01/12/2023 0914   MCH 27.5 04/02/2023 0418   MCHC 32.5 04/02/2023 0418   RDW 14.2 04/02/2023 0418   RDW 11.9 01/12/2023 0914   LYMPHSABS 1.3 01/12/2023 0914   MONOABS 0.4 09/03/2008 2035   EOSABS 0.1 01/12/2023 0914   BASOSABS 0.0 01/12/2023 0914    No results found for: "POCLITH", "LITHIUM"   No results found for: "PHENYTOIN", "PHENOBARB", "VALPROATE", "CBMZ"   .res Assessment: Plan:    Plan:  Lamictal 25mg  x 2 weeks, then 50mg  at night Topamax 25mg  daily x 7 days  and then increase weekly by 25mg  until reaching 100mg   Patient advised to contact office with any questions, adverse effects, or acute worsening in signs and symptoms.  Counseled patient regarding potential benefits, risks, and side effects of Lamictal to include potential risk of Stevens-Johnson syndrome. Advised patient to stop taking Lamictal and contact office immediately if rash develops and to seek urgent medical attention if rash is severe and/or spreading quickly.  There are no diagnoses linked to this encounter.   Please see After Visit Summary for patient specific instructions.  Future Appointments  Date Time Provider Department Center  04/07/2023 10:20 AM Medardo Hassing,  Thereasa Solo, NP CP-CP None  04/07/2023  2:35 PM Mirna Mires, CNM [facility] None  04/13/2023 11:15 AM Mirna Mires, CNM [facility] None  05/14/2023  9:35 AM Mirna Mires, CNM [facility] None    No orders of the defined types were placed in this encounter.   -------------------------------

## 2023-04-07 NOTE — Progress Notes (Signed)
Postpartum Visit  Chief Complaint:  Chief Complaint  Patient presents with   Follow-up    Swelling in both legs. Denies headaches, blurred vision, spots in vision, feeling light headed.   Breast Exam    Possible mastitis, both breasts hard.    History of Present Illness: Patient is a 27 y.o. G2P1011 presents for  a one week postpartum visit.her bleeding is light; she made an appointment due to some continued swelling in her legs. She has also had an engorged right breast, and this has surprised her, as she has a hx of bilateral mastopexy and reduction. She di not think that her breasts would produce.  Date of delivery: 04/01/2023 Type of delivery: Vaginal delivery - Vacuum or forceps assisted  no Episiotomy No.  Laceration:yes, second degree. Pregnancy or labor problems:  Protracted active phase labor- stayed at 9 cms for many hours. Had a shoulder dystocia at delivery. Any problems since the delivery:  yes, she is here due to ongling lef edema, and has had an engorged right breast.   Past Medical History:  Diagnosis Date   Bipolar disorder (HCC)    Cold sore    Depression    Dysplastic nevus 10/15/2020   Left upper back, severe. Exc 02/03/21 2:30pm.   Encounter for planned induction of labor 12/05/2022   Idiopathic intracranial hypertension    Vaccine for human papilloma virus (HPV) types 6, 11, 16, and 18 administered     Past Surgical History:  Procedure Laterality Date   BREAST REDUCTION SURGERY Bilateral 01/13/2022   Procedure: MAMMARY REDUCTION  (BREAST);  Surgeon: Janne Napoleon, MD;  [facility];  Service: Plastics;  Laterality: Bilateral;   WISDOM TOOTH EXTRACTION      Prior to Admission medications   Medication Sig Start Date End Date Taking? Authorizing Provider  valACYclovir (VALTREX) 1000 MG tablet Take 2 tabs BID x 1 day prn sx Patient taking differently: as needed. Take 2 tabs BID x 1 day prn sx 10/15/20  Yes Copland, Alicia B, PA-C  lamoTRIgine  (LAMICTAL) 25 MG tablet Take one tablet at bedtime for 2 weeks, then increase to two tablets at bedtime. Patient not taking: Reported on 04/07/2023 04/07/23   Mozingo, Thereasa Solo, NP  topiramate (TOPAMAX) 50 MG tablet Take one tablet twice daily. Patient not taking: Reported on 04/07/2023 04/07/23   Mozingo, Thereasa Solo, NP    Allergies  Allergen Reactions   Penicillins Hives, Swelling and Other (See Comments)    Swelling and Hives        Hives, throat swells     Social History   Socioeconomic History   Marital status: Significant Other    Spouse name: Conner   Number of children: 0   Years of education: 14   Highest education level: Some college, no degree  Occupational History   Occupation: [employer]  Tobacco Use   Smoking status: Former    Current packs/day: 0.00    Average packs/day: 0.3 packs/day for 2.0 years (0.5 ttl pk-yrs)    Types: Cigarettes    Start date: 04/29/2019    Quit date: 04/28/2021    Years since quitting: 1.9   Smokeless tobacco: Never  Vaping Use   Vaping status: Former   Quit date: 04/14/2021  Substance and Sexual Activity   Alcohol use: Not Currently    Comment: social   Drug use: No   Sexual activity: Not Currently    Partners: Male    Birth control/protection: Injection  Other Topics Concern  Not on file  Social History Narrative   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   Social Determinants of Health   Financial Resource Strain: Low Risk  (08/24/2022)   Overall Financial Resource Strain (CARDIA)    Difficulty of Paying Living Expenses: Not very hard  Food Insecurity: No Food Insecurity (03/31/2023)   Hunger Vital Sign    Worried About Running Out of Food in the Last Year: Never true    Ran Out of Food in the Last Year: Never true  Transportation Needs: No Transportation Needs (03/31/2023)   PRAPARE - Administrator, Civil Service  (Medical): No    Lack of Transportation (Non-Medical): No  Physical Activity: Insufficiently Active (08/24/2022)   Exercise Vital Sign    Days of Exercise per Week: 2 days    Minutes of Exercise per Session: 30 min  Stress: No Stress Concern Present (08/24/2022)   [employer] of Occupational Health - Occupational Stress Questionnaire    Feeling of Stress : Not at all  Social Connections: Moderately Isolated (08/24/2022)   Social Connection and Isolation Panel [NHANES]    Frequency of Communication with Friends and Family: More than three times a week    Frequency of Social Gatherings with Friends and Family: Once a week    Attends Religious Services: Never    Database administrator or Organizations: No    Attends Banker Meetings: Never    Marital Status: Living with partner  Intimate Partner Violence: Not At Risk (03/31/2023)   Humiliation, Afraid, Rape, and Kick questionnaire    Fear of Current or Ex-Partner: No    Emotionally Abused: No    Physically Abused: No    Sexually Abused: No    Family History  Problem Relation Age of Onset   Healthy Mother    Healthy Father    Heart attack Maternal Grandmother    GI Bleed Maternal Grandfather    Cancer Paternal Grandmother    Healthy Half-Sister    Healthy Half-Sister    Healthy Half-Brother    Breast cancer Neg Hx    Ovarian cancer Neg Hx    Colon cancer Neg Hx    Diabetes Neg Hx     Review of Systems  Constitutional: Negative.   HENT: Negative.    Eyes: Negative.   Respiratory: Negative.    Cardiovascular:  Positive for leg swelling.  Gastrointestinal: Negative.   Genitourinary: Negative.   Skin: Negative.   Psychiatric/Behavioral: Negative.    All other systems reviewed and are negative.    Physical Exam BP 112/78   Pulse 78   Temp 98.7 F (37.1 C)   Ht 5\' 3"  (1.6 m)   Wt 208 lb (94.3 kg)   Breastfeeding No   BMI 36.85 kg/m   Physical Exam Constitutional:      Appearance: Normal appearance.  She is obese.  Genitourinary:     Genitourinary Comments: deferred  Cardiovascular:     Rate and Rhythm: Normal rate and regular rhythm.     Pulses: Normal pulses.     Heart sounds: Normal heart sounds.  Pulmonary:     Effort: Pulmonary effort is normal.     Breath sounds: Normal breath sounds.  Abdominal:     Palpations: Abdomen is soft.  Musculoskeletal:        General: Swelling present.     Comments: Lower legs  are slightly edematous- no pitting edema.  Neurological:     Mental Status: She is alert.  Skin:    General: Skin is warm and dry.  Psychiatric:        Mood and Affect: Mood normal.        Behavior: Behavior normal.      Female Chaperone present during breast and/or pelvic exam.  Assessment: 27 y.o. G2P1011 presenting for a 1 week week postpartum visit Ankle edema Right breast engorgement   Plan: Problem List Items Addressed This Visit       Other   Postpartum care following vaginal delivery - Primary   Relevant Orders   POCT Urinalysis Dipstick (Completed)  I have advised her to take tub soaks and use Epsom salts to help with her edema. She can also use caffeine.  Reasurred her that her blood pressure is WNL, and she may have "third spacing" of fluids due to longer labor, pitocin use.  She will use cabbage leaves to her breasts to suppress her breast sweliing, wear bras even at night , and avoid stimulating the breasts.  She received a Depo shot prior to discharge at the hospital.  We did discuss other options for birth control as Depo is ususally associated with weight gain. IUD as an option discussed. She will RTC in 5 weeks for her PP phsyical.  Mirna Mires, CNM  04/07/2023 4:01 PM     Thomasene Mohair, MD 04/07/2023 3:49 PM

## 2023-04-12 NOTE — Anesthesia Postprocedure Evaluation (Signed)
Anesthesia Post Note  Patient: Emily Hernandez  Procedure(s) Performed: AN AD HOC LABOR EPIDURAL  Patient location during evaluation: Mother Baby Anesthesia Type: Epidural Level of consciousness: awake, oriented and patient cooperative Pain management: pain level controlled Vital Signs Assessment: post-procedure vital signs reviewed and stable Respiratory status: spontaneous breathing, respiratory function stable and nonlabored ventilation Cardiovascular status: stable Postop Assessment: no headache, no backache, able to ambulate and adequate PO intake Anesthetic complications: no   No notable events documented.   Last Vitals: There were no vitals filed for this visit.  Last Pain: There were no vitals filed for this visit.               Reed Breech

## 2023-04-13 ENCOUNTER — Telehealth: Payer: Self-pay

## 2023-04-13 ENCOUNTER — Telehealth: Payer: Medicaid Other | Admitting: Obstetrics

## 2023-04-13 NOTE — Telephone Encounter (Signed)
[facility]- Discharge Call Backs-Left Voicemail about the following below. 1-Do you have any questions or concerns about yourself as you heal? 2-Any concerns or questions about your baby? Is your baby eating, peeing,pooping well? 3 Reviewed ABC's of safe sleep. 4-How was your stay at the hospital? 5- Did our team work together to care for you? You should be receiving a survey in the mail soon.   We would really appreciate it if you could fill that out for us and return it in the mail.  We value the feedback to make improvements and continue the great work we do.   If you have any questions please feel free to call me back at [phone]  

## 2023-05-13 NOTE — Progress Notes (Signed)
   OBSTETRICS POSTPARTUM CLINIC PROGRESS NOTE  Subjective:     Emily Hernandez is a 27 y.o. G109P1011 female who presents for a postpartum visit. She is 6 weeks postpartum following a spontaneous vaginal delivery. I have fully reviewed the prenatal and intrapartum course. The delivery was at [redacted]w[redacted]d gestational weeks.  Anesthesia: epidural. Postpartum course has been going well. Baby's course has been good. Baby is feeding by bottle - Similac Advance. Bleeding: patient has not not resumed menses, with No LMP recorded.. Bowel function is normal. Bladder function is normal. Patient is not sexually active. Contraception method desired is already on Depo-Provera injections. Postpartum depression screening: positive.  EDPS score is 7.  Her delivery was marked by a should dystocia that was quickly resolved with supra pubic pressure.  She has a history of LGSIL pap smears. Lat pap was in 7/6 2023   The following portions of the patient's history were reviewed and updated as appropriate: allergies, current medications, past family history, past medical history, past social history, past surgical history, and problem list.  Review of Systems Pertinent items noted in HPI and remainder of comprehensive ROS otherwise negative.   Objective:    There were no vitals taken for this visit.  General:  alert and no distress   Breasts:  inspection negative, no nipple discharge or bleeding, no masses or nodularity palpable. Scars from mastopexy, not lactating  Lungs: clear to auscultation bilaterally  Heart:  regular rate and rhythm, S1, S2 normal, no murmur, click, rub or gallop  Abdomen: soft, non-tender; bowel sounds normal; no masses,  no organomegaly.     Vulva:  normal  Vagina: normal vagina, no discharge, exudate, lesion, or erythema. Laceration is well healed.  Cervix:  no cervical motion tenderness and no lesions  Corpus: normal size, contour, position, consistency, mobility, non-tender  Adnexa:   normal adnexa and no mass, fullness, tenderness  Rectal Exam: Not performed.         Labs:  Lab Results  Component Value Date   HGB 8.7 (L) 04/02/2023     Assessment:   6 week post partum visit Hx of LGSIL pap smears and colposcopy.   Plan:    1. Contraception: Depo-Provera injections 2. Will check Hgb for h/o postpartum anemia of less than 10. 14 today. 3. Follow up in: 3-4  months for annual exam Pap smear done today. Will f/u per the result. I have ordered HPV typing if High risk.  Paula Compton, CNM [facility]

## 2023-05-14 ENCOUNTER — Other Ambulatory Visit (HOSPITAL_COMMUNITY)
Admission: RE | Admit: 2023-05-14 | Discharge: 2023-05-14 | Disposition: A | Discharge status: Home / Self Care | Payer: Medicaid Other | Source: Ambulatory Visit | Attending: Obstetrics | Admitting: Obstetrics

## 2023-05-14 ENCOUNTER — Ambulatory Visit (INDEPENDENT_AMBULATORY_CARE_PROVIDER_SITE_OTHER): Payer: Medicaid Other | Admitting: Obstetrics

## 2023-05-14 ENCOUNTER — Encounter: Payer: Self-pay | Admitting: Obstetrics

## 2023-05-14 DIAGNOSIS — Z124 Encounter for screening for malignant neoplasm of cervix: Secondary | ICD-10-CM

## 2023-05-14 DIAGNOSIS — D649 Anemia, unspecified: Secondary | ICD-10-CM

## 2023-05-14 NOTE — Patient Instructions (Signed)

## 2023-05-20 LAB — CYTOLOGY - PAP
Comment: NEGATIVE
Diagnosis: NEGATIVE
High risk HPV: NEGATIVE

## 2023-06-02 ENCOUNTER — Ambulatory Visit: Payer: Medicaid Other | Admitting: Adult Health

## 2023-06-02 ENCOUNTER — Encounter: Payer: Self-pay | Admitting: Adult Health

## 2023-06-02 DIAGNOSIS — F902 Attention-deficit hyperactivity disorder, combined type: Secondary | ICD-10-CM | POA: Diagnosis not present

## 2023-06-02 DIAGNOSIS — F319 Bipolar disorder, unspecified: Secondary | ICD-10-CM

## 2023-06-02 DIAGNOSIS — G4700 Insomnia, unspecified: Secondary | ICD-10-CM

## 2023-06-02 DIAGNOSIS — F411 Generalized anxiety disorder: Secondary | ICD-10-CM

## 2023-06-02 MED ORDER — LAMOTRIGINE 100 MG PO TABS
ORAL_TABLET | ORAL | 5 refills | Status: DC
Start: 2023-06-02 — End: 2023-06-30

## 2023-06-02 MED ORDER — BUPROPION HCL ER (XL) 150 MG PO TB24: 150.0000 mg | ORAL_TABLET | Freq: Every day | ORAL | 0 refills | Status: DC

## 2023-06-02 NOTE — Progress Notes (Signed)
Emily Hernandez [id number] 03/20/1996 26 y.o.  Subjective:   Patient ID:  Emily Hernandez is a 27 y.o. (DOB October 02, 1995) female.  Chief Complaint: No chief complaint on file.   HPI Emily Hernandez presents to the office today for follow-up of ADHD, MDD, GAD, insomnia, and Bipolar disorder.   Describes mood today as "not the best". Pleasant. Tearful at times. Mood symptoms - reports increased anxiety, depression and irritability. Denies panic attacks. Reports some worry, rumination and over thinking. Reports feeling overwhelmed. Reports mood is lower. Stating "I don't feel like I'm doing too good". She is now 2 months postpartum - working on getting back on previous medication regimen. She and boyfriend doing well - together 3 years. Mother local and supportive. Varying interest and motivation. Taking other medications as prescribed. Energy levels lower. Active, does not have a regular exercise routine.  Enjoys some usual interests and activities. Lives with boyfriend and her 96 month old son. Mother in [place].  Appetite adequate. Weight loss - 5 pounds.  Sleeps better some nights than others. Averages 4 to 8 hours during the night and napping some during the day. Focus and concentration difficulties - has restarted Adderall 20mg  daily and feels like it is helpful. Completing tasks. Managing aspects of household. Working 2 days a week. Denies SI or HI. Denies AH or VH. Denies self harm. Denies substance use.   Previous medication trials: Latuda, Abilify, Lamictal, Topamax, Vraylar.   GAD-7    Flowsheet Row Procedure visit from 09/23/2020 in [facility] Office Visit from 08/02/2020 in [facility]  Total GAD-7 Score 15 12      PHQ2-9    Flowsheet Row Procedure visit from 09/23/2020 in [facility] Office Visit from 08/02/2020 in [facility]  PHQ-2 Total Score 4 0  PHQ-9 Total Score 16 9      Flowsheet Row ED to Hosp-Admission  (Discharged) from 03/31/2023 in [facility] from 08/05/2022 in [facility] Emergency Department at [facility] ED from 04/10/2022 in [facility] Emergency Department at [facility]  C-SSRS RISK CATEGORY No Risk No Risk No Risk        Review of Systems:  Review of Systems  Musculoskeletal:  Negative for gait problem.  Neurological:  Negative for tremors.  Psychiatric/Behavioral:         Please refer to HPI    Medications: I have reviewed the patient's current medications.  Current Outpatient Medications  Medication Sig Dispense Refill   lamoTRIgine (LAMICTAL) 25 MG tablet Take one tablet at bedtime for 2 weeks, then increase to two tablets at bedtime. 60 tablet 2   topiramate (TOPAMAX) 50 MG tablet Take one tablet twice daily. 60 tablet 2   valACYclovir (VALTREX) 1000 MG tablet Take 2 tabs BID x 1 day prn sx (Patient taking differently: as needed. Take 2 tabs BID x 1 day prn sx) 30 tablet 1   No current facility-administered medications for this visit.    Medication Side Effects: None  Allergies:  Allergies  Allergen Reactions   Penicillins Hives, Swelling and Other (See Comments)    Swelling and Hives        Hives, throat swells    Past Medical History:  Diagnosis Date   Bipolar disorder (HCC)    Cold sore    Depression    Dysplastic nevus 10/15/2020   Left upper back, severe. Exc 02/03/21 2:30pm.   Encounter for planned induction of labor 12/05/2022  Idiopathic intracranial hypertension    Vaccine for human papilloma virus (HPV) types 6, 11, 16, and 18 administered     Past Medical History, Surgical history, Social history, and Family history were reviewed and updated as appropriate.   Please see review of systems for further details on the patient's review from today.   Objective:   Physical Exam:  LMP  (LMP Unknown)   Physical Exam Constitutional:      General: She is not in acute distress. Musculoskeletal:         General: No deformity.  Neurological:     Mental Status: She is alert and oriented to person, place, and time.     Coordination: Coordination normal.  Psychiatric:        Attention and Perception: Attention and perception normal. She does not perceive auditory or visual hallucinations.        Mood and Affect: Affect is not labile, blunt, angry or inappropriate.        Speech: Speech normal.        Behavior: Behavior normal.        Thought Content: Thought content normal. Thought content is not paranoid or delusional. Thought content does not include homicidal or suicidal ideation. Thought content does not include homicidal or suicidal plan.        Cognition and Memory: Cognition and memory normal.        Judgment: Judgment normal.     Comments: Insight intact     Lab Review:     Component Value Date/Time   NA 139 04/10/2022 1051   K 3.8 04/10/2022 1051   CL 112 (H) 04/10/2022 1051   CO2 23 04/10/2022 1051   GLUCOSE 96 04/10/2022 1051   BUN 12 04/10/2022 1051   CREATININE 0.81 04/10/2022 1051   CREATININE 0.75 01/30/2014 1309   CALCIUM 9.5 04/10/2022 1051   PROT 7.3 04/10/2022 1051   ALBUMIN 4.5 04/10/2022 1051   AST 9 03/09/2023 0830   ALT 8 03/09/2023 0830   ALKPHOS 41 04/10/2022 1051   BILITOT 0.5 04/10/2022 1051   GFRNONAA >60 04/10/2022 1051   GFRAA >60 05/01/2019 1303       Component Value Date/Time   WBC 13.7 (H) 04/02/2023 0418   RBC 3.16 (L) 04/02/2023 0418   HGB 8.7 (L) 04/02/2023 0418   HGB 11.0 (L) 01/12/2023 0914   HCT 26.8 (L) 04/02/2023 0418   HCT 33.8 (L) 01/12/2023 0914   PLT 186 04/02/2023 0418   PLT 178 01/12/2023 0914   MCV 84.8 04/02/2023 0418   MCV 89 01/12/2023 0914   MCH 27.5 04/02/2023 0418   MCHC 32.5 04/02/2023 0418   RDW 14.2 04/02/2023 0418   RDW 11.9 01/12/2023 0914   LYMPHSABS 1.3 01/12/2023 0914   MONOABS 0.4 09/03/2008 2035   EOSABS 0.1 01/12/2023 0914   BASOSABS 0.0 01/12/2023 0914    No results found for: "POCLITH",  "LITHIUM"   No results found for: "PHENYTOIN", "PHENOBARB", "VALPROATE", "CBMZ"   .res Assessment: Plan:    Plan:  Adderall 20mg  - restarted since last visit - from previous prescription.  Increase Lamictal 50mg  to 100mg  at hs Topamax 50mg  to 75mg  x 7 days, then increase to 100mg   Add Wellbutrin XL 150mg  every morning  Patient advised to contact office with any questions, adverse effects, or acute worsening in signs and symptoms.  Counseled patient regarding potential benefits, risks, and side effects of Lamictal to include potential risk of Stevens-Johnson syndrome. Advised patient to stop taking Lamictal  and contact office immediately if rash develops and to seek urgent medical attention if rash is severe and/or spreading quickly.  Discussed potential benefits, risks, and side effects of stimulants with patient to include increased heart rate, palpitations, insomnia, increased anxiety, increased irritability, or decreased appetite.  Instructed patient to contact office if experiencing any significant tolerability issues.   There are no diagnoses linked to this encounter.   Please see After Visit Summary for patient specific instructions.  Future Appointments  Date Time Provider Department Center  06/25/2023 10:15 AM AOB-NURSE AOB-A[facility] None  06/30/2023  9:00 AM Maribeth Jiles, Thereasa Solo, NP CP-CP None    No orders of the defined types were placed in this encounter.   -------------------------------

## 2023-06-03 ENCOUNTER — Telehealth: Payer: Self-pay | Admitting: Adult Health

## 2023-06-03 NOTE — Telephone Encounter (Signed)
LF 11/9

## 2023-06-03 NOTE — Telephone Encounter (Signed)
Patient called in for refill on Adderall 20mg . Ph: [phone] Appt 11/13 Pharmacy [facility] [address] [place], [place]

## 2023-06-04 ENCOUNTER — Other Ambulatory Visit: Payer: Self-pay

## 2023-06-04 MED ORDER — AMPHETAMINE-DEXTROAMPHET ER 20 MG PO CP24: 20.0000 mg | ORAL_CAPSULE | Freq: Two times a day (BID) | ORAL | 0 refills | Status: DC

## 2023-06-04 NOTE — Telephone Encounter (Signed)
Pended.

## 2023-06-24 NOTE — Progress Notes (Signed)
    NURSE VISIT NOTE  Subjective:    Patient ID: Emily Hernandez, female    DOB: 09-01-1995, 27 y.o.   MRN: [id number]  HPI  Patient is a 27 y.o. G34P1011 female who presents for depo provera injection.   Objective:    BP 119/75   Pulse 80   Ht 5' 3.5" (1.613 m) Comment: patient reports  Wt 200 lb 6.4 oz (90.9 kg)   BMI 34.94 kg/m   Last Annual: 02/19/2022. Last pap: 05/14/2023. Last Depo-Provera: 04/02/2023 at [facility] patient reports. Side Effects if any: none. Serum HCG indicated? No . Depo-Provera 150 mg IM given by: Sheliah Hatch, CMA. Site: Right Deltoid  Lab Review    Assessment:   No diagnosis found.   Plan:   Next appointment due between 09/10/2023 and 09/24/2023.    Fonda Kinder, CMA

## 2023-06-25 ENCOUNTER — Ambulatory Visit (INDEPENDENT_AMBULATORY_CARE_PROVIDER_SITE_OTHER): Payer: Medicaid Other

## 2023-06-25 VITALS — BP 119/75 | HR 80 | Ht 63.5 in | Wt 200.4 lb

## 2023-06-25 DIAGNOSIS — Z3042 Encounter for surveillance of injectable contraceptive: Secondary | ICD-10-CM

## 2023-06-25 MED ORDER — MEDROXYPROGESTERONE ACETATE 150 MG/ML IM SUSP
150.0000 mg | Freq: Once | INTRAMUSCULAR | Status: AC
Start: 2023-06-25 — End: 2023-06-25
  Administered 2023-06-25: 150 mg via INTRAMUSCULAR

## 2023-06-25 NOTE — Patient Instructions (Signed)

## 2023-06-26 ENCOUNTER — Other Ambulatory Visit: Payer: Self-pay | Admitting: Adult Health

## 2023-06-26 DIAGNOSIS — F319 Bipolar disorder, unspecified: Secondary | ICD-10-CM

## 2023-06-27 NOTE — Telephone Encounter (Signed)
Has appt 11/13

## 2023-06-30 ENCOUNTER — Other Ambulatory Visit: Payer: Self-pay

## 2023-06-30 ENCOUNTER — Encounter: Payer: Self-pay | Admitting: Adult Health

## 2023-06-30 ENCOUNTER — Ambulatory Visit: Payer: Medicaid Other | Admitting: Adult Health

## 2023-06-30 DIAGNOSIS — G4700 Insomnia, unspecified: Secondary | ICD-10-CM | POA: Diagnosis not present

## 2023-06-30 DIAGNOSIS — F902 Attention-deficit hyperactivity disorder, combined type: Secondary | ICD-10-CM | POA: Diagnosis not present

## 2023-06-30 DIAGNOSIS — F319 Bipolar disorder, unspecified: Secondary | ICD-10-CM

## 2023-06-30 DIAGNOSIS — F411 Generalized anxiety disorder: Secondary | ICD-10-CM | POA: Diagnosis not present

## 2023-06-30 MED ORDER — AMPHETAMINE-DEXTROAMPHETAMINE 20 MG PO TABS
20.0000 mg | ORAL_TABLET | Freq: Every day | ORAL | 0 refills | Status: DC
Start: 2023-06-30 — End: 2023-08-25
  Filled 2023-06-30: qty 30, 30d supply, fill #0

## 2023-06-30 MED ORDER — LAMOTRIGINE 150 MG PO TABS
ORAL_TABLET | ORAL | 2 refills | Status: DC
Start: 2023-06-30 — End: 2023-08-25

## 2023-06-30 NOTE — Progress Notes (Signed)
Emily Hernandez [id number] Aug 17, 1996 26 y.o.  Subjective:   Patient ID:  Emily Hernandez is a 27 y.o. (DOB 1995-11-07) female.  Chief Complaint: No chief complaint on file.   HPI Emily Hernandez presents to the office today for follow-up of ADHD, MDD, GAD, insomnia, and Bipolar disorder.   Describes mood today as "about the same". Pleasant. Reports decreased tearfulness. Mood symptoms - reports increased anxiety - unable to determine cause.  Reports decreased depression. Reports irritability - patience is low. Denies panic attacks. Reports some worry, rumination and over thinking. Reports getting upset when things don't go the way she wants them too. Reports feeling overwhelmed. Reports mood is lower. Stating "I don't feel a whole lot better". Feels like the medications are helpful. She and boyfriend doing well - together 3 years. Mother local and supportive. Varying interest and motivation. Taking other medications as prescribed. Energy levels lower. Active, does not have a regular exercise routine.  Enjoys some usual interests and activities. Lives with boyfriend and her 83 month old son. Mother in [place].  Appetite adequate. Weight loss - 5 pounds.  Sleeps better some nights than others. Averages 4 to 8 hours of broken sleep - up and down with son. Focus and concentration difficulties - has restarted Adderall 20mg  daily and feels like it is helpful. Completing tasks. Managing aspects of household. Working 2 days a week. Denies SI or HI. Denies AH or VH. Denies self harm. Denies substance use.   Previous medication trials: Latuda, Abilify, Lamictal, Topamax, Vraylar.   GAD-7    Flowsheet Row Procedure visit from 09/23/2020 in [facility] Office Visit from 08/02/2020 in [facility]  Total GAD-7 Score 15 12      PHQ2-9    Flowsheet Row Procedure visit from 09/23/2020 in [facility] Office Visit from 08/02/2020 in [facility]   PHQ-2 Total Score 4 0  PHQ-9 Total Score 16 9      Flowsheet Row ED to Hosp-Admission (Discharged) from 03/31/2023 in [facility] from 08/05/2022 in [facility] Emergency Department at [facility] ED from 04/10/2022 in [facility] Emergency Department at [facility]  C-SSRS RISK CATEGORY No Risk No Risk No Risk        Review of Systems:  Review of Systems  Musculoskeletal:  Negative for gait problem.  Neurological:  Negative for tremors.  Psychiatric/Behavioral:         Please refer to HPI    Medications: I have reviewed the patient's current medications.  Current Outpatient Medications  Medication Sig Dispense Refill   buPROPion (WELLBUTRIN XL) 150 MG 24 hr tablet TAKE 1 TABLET BY MOUTH EVERY DAY 30 tablet 0   amphetamine-dextroamphetamine (ADDERALL XR) 20 MG 24 hr capsule Take 1 capsule (20 mg total) by mouth in the morning and at bedtime. 60 capsule 0   lamoTRIgine (LAMICTAL) 100 MG tablet Take one tablet at bedtime. 30 tablet 5   topiramate (TOPAMAX) 50 MG tablet Take one tablet twice daily. 60 tablet 2   valACYclovir (VALTREX) 1000 MG tablet Take 2 tabs BID x 1 day prn sx (Patient taking differently: as needed. Take 2 tabs BID x 1 day prn sx) 30 tablet 1   No current facility-administered medications for this visit.    Medication Side Effects: None  Allergies:  Allergies  Allergen Reactions   Penicillins Hives, Swelling and Other (See Comments)    Swelling and Hives  Hives, throat swells    Past Medical History:  Diagnosis Date   Bipolar disorder (HCC)    Cold sore    Depression    Dysplastic nevus 10/15/2020   Left upper back, severe. Exc 02/03/21 2:30pm.   Encounter for planned induction of labor 12/05/2022   Idiopathic intracranial hypertension    Vaccine for human papilloma virus (HPV) types 6, 11, 16, and 18 administered     Past Medical History, Surgical history, Social history, and Family history  were reviewed and updated as appropriate.   Please see review of systems for further details on the patient's review from today.   Objective:   Physical Exam:  There were no vitals taken for this visit.  Physical Exam Constitutional:      General: She is not in acute distress. Musculoskeletal:        General: No deformity.  Neurological:     Mental Status: She is alert and oriented to person, place, and time.     Coordination: Coordination normal.  Psychiatric:        Attention and Perception: Attention and perception normal. She does not perceive auditory or visual hallucinations.        Mood and Affect: Mood normal. Mood is not anxious or depressed. Affect is not labile, blunt, angry or inappropriate.        Speech: Speech normal.        Behavior: Behavior normal.        Thought Content: Thought content normal. Thought content is not paranoid or delusional. Thought content does not include homicidal or suicidal ideation. Thought content does not include homicidal or suicidal plan.        Cognition and Memory: Cognition and memory normal.        Judgment: Judgment normal.     Comments: Insight intact     Lab Review:     Component Value Date/Time   NA 139 04/10/2022 1051   K 3.8 04/10/2022 1051   CL 112 (H) 04/10/2022 1051   CO2 23 04/10/2022 1051   GLUCOSE 96 04/10/2022 1051   BUN 12 04/10/2022 1051   CREATININE 0.81 04/10/2022 1051   CREATININE 0.75 01/30/2014 1309   CALCIUM 9.5 04/10/2022 1051   PROT 7.3 04/10/2022 1051   ALBUMIN 4.5 04/10/2022 1051   AST 9 03/09/2023 0830   ALT 8 03/09/2023 0830   ALKPHOS 41 04/10/2022 1051   BILITOT 0.5 04/10/2022 1051   GFRNONAA >60 04/10/2022 1051   GFRAA >60 05/01/2019 1303       Component Value Date/Time   WBC 13.7 (H) 04/02/2023 0418   RBC 3.16 (L) 04/02/2023 0418   HGB 8.7 (L) 04/02/2023 0418   HGB 11.0 (L) 01/12/2023 0914   HCT 26.8 (L) 04/02/2023 0418   HCT 33.8 (L) 01/12/2023 0914   PLT 186 04/02/2023 0418    PLT 178 01/12/2023 0914   MCV 84.8 04/02/2023 0418   MCV 89 01/12/2023 0914   MCH 27.5 04/02/2023 0418   MCHC 32.5 04/02/2023 0418   RDW 14.2 04/02/2023 0418   RDW 11.9 01/12/2023 0914   LYMPHSABS 1.3 01/12/2023 0914   MONOABS 0.4 09/03/2008 2035   EOSABS 0.1 01/12/2023 0914   BASOSABS 0.0 01/12/2023 0914    No results found for: "POCLITH", "LITHIUM"   No results found for: "PHENYTOIN", "PHENOBARB", "VALPROATE", "CBMZ"   .res Assessment: Plan:    Plan:  Lamictal 100mg  to 150mg  at hs  Adderall 20mg  daily  Topamax 100mg  Wellbutrin XL 150mg  every  morning  Patient advised to contact office with any questions, adverse effects, or acute worsening in signs and symptoms.  Counseled patient regarding potential benefits, risks, and side effects of Lamictal to include potential risk of Stevens-Johnson syndrome. Advised patient to stop taking Lamictal and contact office immediately if rash develops and to seek urgent medical attention if rash is severe and/or spreading quickly.  Discussed potential benefits, risks, and side effects of stimulants with patient to include increased heart rate, palpitations, insomnia, increased anxiety, increased irritability, or decreased appetite.  Instructed patient to contact office if experiencing any significant tolerability issues.   There are no diagnoses linked to this encounter.   Please see After Visit Summary for patient specific instructions.  Future Appointments  Date Time Provider Department Center  06/30/2023  9:00 AM Slyvester Latona, Thereasa Solo, NP CP-CP None  09/17/2023  8:15 AM AOB-NURSE AOB-AOB None    No orders of the defined types were placed in this encounter.   -------------------------------

## 2023-07-01 ENCOUNTER — Other Ambulatory Visit: Payer: Self-pay

## 2023-07-23 ENCOUNTER — Other Ambulatory Visit: Payer: Self-pay | Admitting: Adult Health

## 2023-07-23 DIAGNOSIS — F319 Bipolar disorder, unspecified: Secondary | ICD-10-CM

## 2023-07-28 ENCOUNTER — Encounter: Payer: Self-pay | Admitting: Adult Health

## 2023-07-28 ENCOUNTER — Ambulatory Visit: Payer: Medicaid Other | Admitting: Adult Health

## 2023-07-28 DIAGNOSIS — F319 Bipolar disorder, unspecified: Secondary | ICD-10-CM

## 2023-07-28 DIAGNOSIS — F411 Generalized anxiety disorder: Secondary | ICD-10-CM | POA: Diagnosis not present

## 2023-07-28 DIAGNOSIS — G4700 Insomnia, unspecified: Secondary | ICD-10-CM | POA: Diagnosis not present

## 2023-07-28 DIAGNOSIS — F902 Attention-deficit hyperactivity disorder, combined type: Secondary | ICD-10-CM | POA: Diagnosis not present

## 2023-07-28 MED ORDER — BUPROPION HCL ER (XL) 300 MG PO TB24
300.0000 mg | ORAL_TABLET | Freq: Every day | ORAL | 2 refills | Status: DC
Start: 2023-07-28 — End: 2023-08-25

## 2023-07-28 NOTE — Progress Notes (Signed)
Emily Hernandez [id number] November 29, 1995 27 y.o.  Subjective:   Patient ID:  Emily Hernandez is a 27 y.o. (DOB 17-Jan-1996) female.  Chief Complaint: No chief complaint on file.   HPI Emily Hernandez presents to the office today for follow-up of ADHD, MDD, GAD, insomnia, and Bipolar disorder.   Describes mood today as "no different". Pleasant. Reports tearfulness. Mood symptoms - reports increased anxiety, depression, and irritability. Denies panic attacks, but feels panicked. Reports worry, rumination and over thinking. Reports feeling overwhelmed. Reports recent ear infection with a 10 day steroid taper - feels like she has been "off" since starting the steroids. Reports mood is lower. Stating "I don't feel like I'm doing too good". Feels like the medications are helpful, but is willing to consider other options. She and boyfriend doing well - together 3 years. Mother local and supportive. Varying interest and motivation. Taking other medications as prescribed. Energy levels lower. Active, does not have a regular exercise routine.  Enjoys some usual interests and activities. Lives with boyfriend and her 49 month old son. Mother in [place].  Appetite adequate. Weight gain.  Sleeps better some nights than others. Averages 4 hours currently with steroid use. Also reports broken sleep - up and down with son. Focus and concentration improved - has restarted Adderall 20mg  daily and feels like it is helpful. Completing tasks. Managing aspects of household. Working 2 days a week. Denies SI or HI. Denies AH or VH. Denies self harm. Denies substance use.   Previous medication trials: Latuda, Abilify, Lamictal, Topamax, Vraylar.   GAD-7    Flowsheet Row Procedure visit from 09/23/2020 in [facility] Office Visit from 08/02/2020 in [facility]  Total GAD-7 Score 15 12      PHQ2-9    Flowsheet Row Procedure visit from 09/23/2020 in [facility] Office  Visit from 08/02/2020 in [facility]  PHQ-2 Total Score 4 0  PHQ-9 Total Score 16 9      Flowsheet Row ED to Hosp-Admission (Discharged) from 03/31/2023 in [facility] ED from 08/05/2022 in [facility] Emergency Department at [facility] ED from 04/10/2022 in [facility] Emergency Department at [facility]  C-SSRS RISK CATEGORY No Risk No Risk No Risk        Review of Systems:  Review of Systems  Medications: I have reviewed the patient's current medications.  Current Outpatient Medications  Medication Sig Dispense Refill   amphetamine-dextroamphetamine (ADDERALL XR) 20 MG 24 hr capsule Take 1 capsule (20 mg total) by mouth in the morning and at bedtime. 60 capsule 0   amphetamine-dextroamphetamine (ADDERALL) 20 MG tablet Take 1 tablet (20 mg total) by mouth daily. 30 tablet 0   buPROPion (WELLBUTRIN XL) 300 MG 24 hr tablet Take 1 tablet (300 mg total) by mouth daily. 30 tablet 2   lamoTRIgine (LAMICTAL) 150 MG tablet Take one tablet at bedtime. 30 tablet 2   topiramate (TOPAMAX) 50 MG tablet Take one tablet twice daily. 60 tablet 2   valACYclovir (VALTREX) 1000 MG tablet Take 2 tabs BID x 1 day prn sx (Patient taking differently: as needed. Take 2 tabs BID x 1 day prn sx) 30 tablet 1   No current facility-administered medications for this visit.    Medication Side Effects: None  Allergies:  Allergies  Allergen Reactions   Penicillins Hives, Swelling and Other (See Comments)    Swelling and Hives        Hives, throat swells  Past Medical History:  Diagnosis Date   Bipolar disorder (HCC)    Cold sore    Depression    Dysplastic nevus 10/15/2020   Left upper back, severe. Exc 02/03/21 2:30pm.   Encounter for planned induction of labor 12/05/2022   Idiopathic intracranial hypertension    Vaccine for human papilloma virus (HPV) types 6, 11, 16, and 18 administered     Past Medical History, Surgical history,  Social history, and Family history were reviewed and updated as appropriate.   Please see review of systems for further details on the patient's review from today.   Objective:   Physical Exam:  There were no vitals taken for this visit.  Physical Exam  Lab Review:     Component Value Date/Time   NA 139 04/10/2022 1051   K 3.8 04/10/2022 1051   CL 112 (H) 04/10/2022 1051   CO2 23 04/10/2022 1051   GLUCOSE 96 04/10/2022 1051   BUN 12 04/10/2022 1051   CREATININE 0.81 04/10/2022 1051   CREATININE 0.75 01/30/2014 1309   CALCIUM 9.5 04/10/2022 1051   PROT 7.3 04/10/2022 1051   ALBUMIN 4.5 04/10/2022 1051   AST 9 03/09/2023 0830   ALT 8 03/09/2023 0830   ALKPHOS 41 04/10/2022 1051   BILITOT 0.5 04/10/2022 1051   GFRNONAA >60 04/10/2022 1051   GFRAA >60 05/01/2019 1303       Component Value Date/Time   WBC 13.7 (H) 04/02/2023 0418   RBC 3.16 (L) 04/02/2023 0418   HGB 8.7 (L) 04/02/2023 0418   HGB 11.0 (L) 01/12/2023 0914   HCT 26.8 (L) 04/02/2023 0418   HCT 33.8 (L) 01/12/2023 0914   PLT 186 04/02/2023 0418   PLT 178 01/12/2023 0914   MCV 84.8 04/02/2023 0418   MCV 89 01/12/2023 0914   MCH 27.5 04/02/2023 0418   MCHC 32.5 04/02/2023 0418   RDW 14.2 04/02/2023 0418   RDW 11.9 01/12/2023 0914   LYMPHSABS 1.3 01/12/2023 0914   MONOABS 0.4 09/03/2008 2035   EOSABS 0.1 01/12/2023 0914   BASOSABS 0.0 01/12/2023 0914    No results found for: "POCLITH", "LITHIUM"   No results found for: "PHENYTOIN", "PHENOBARB", "VALPROATE", "CBMZ"   .res Assessment: Plan:    Plan:  Lamictal 150mg  at hs Adderall 20mg  daily  Topamax 100mg  Wellbutrin XL 150mg  to 300mg  every morning  Patient advised to contact office with any questions, adverse effects, or acute worsening in signs and symptoms.  Counseled patient regarding potential benefits, risks, and side effects of Lamictal to include potential risk of Stevens-Johnson syndrome. Advised patient to stop taking Lamictal and  contact office immediately if rash develops and to seek urgent medical attention if rash is severe and/or spreading quickly.  Discussed potential benefits, risks, and side effects of stimulants with patient to include increased heart rate, palpitations, insomnia, increased anxiety, increased irritability, or decreased appetite.  Instructed patient to contact office if experiencing any significant tolerability issues.  Diagnoses and all orders for this visit:  Attention deficit hyperactivity disorder (ADHD), combined type  Bipolar I disorder (HCC) -     buPROPion (WELLBUTRIN XL) 300 MG 24 hr tablet; Take 1 tablet (300 mg total) by mouth daily.  Generalized anxiety disorder  Insomnia, unspecified type     Please see After Visit Summary for patient specific instructions.  Future Appointments  Date Time Provider Department Center  09/17/2023  8:15 AM [facility]-NURSE [facility] None    No orders of the defined types were placed in this encounter.   -------------------------------

## 2023-08-16 ENCOUNTER — Ambulatory Visit
Admission: EM | Admit: 2023-08-16 | Discharge: 2023-08-16 | Disposition: A | Discharge status: Home / Self Care | Payer: Medicaid Other | Attending: Emergency Medicine | Admitting: Emergency Medicine

## 2023-08-16 ENCOUNTER — Telehealth: Payer: Self-pay | Admitting: Adult Health

## 2023-08-16 ENCOUNTER — Encounter: Payer: Self-pay | Admitting: *Deleted

## 2023-08-16 ENCOUNTER — Other Ambulatory Visit: Payer: Self-pay

## 2023-08-16 DIAGNOSIS — L509 Urticaria, unspecified: Secondary | ICD-10-CM

## 2023-08-16 MED ORDER — PREDNISONE 10 MG (21) PO TBPK: ORAL_TABLET | Freq: Every day | ORAL | 0 refills | Status: DC

## 2023-08-16 MED ORDER — DEXAMETHASONE SODIUM PHOSPHATE 10 MG/ML IJ SOLN
10.0000 mg | Freq: Once | INTRAMUSCULAR | Status: AC
  Administered 2023-08-16: 10 mg via INTRAMUSCULAR

## 2023-08-16 NOTE — Telephone Encounter (Signed)
Emily Hernandez states that she has been taking Lamictal 150 mg since 11/13, but the hives just started Saturday night 12/28. Hives are still active. She was seen at Urgent care today where she was given steroids to take for the next week. She reports it started on her head and moved down her body. She denies tongue swelling or difficulty breathing.   Please advise.

## 2023-08-16 NOTE — ED Provider Notes (Signed)
Renaldo Fiddler    CSN: [id number] Arrival date & time: 08/16/23  4098      History   Chief Complaint Chief Complaint  Patient presents with   Urticaria    HPI Emily Hernandez is a 27 y.o. female.   Patient presents for evaluation of generalized hives present for 2 days.  Initially began to the scalp, described as persistent itching and burning, spread to the remainder of the body within 24 hours.  Symptoms began after use of a new shampoo and conditioner that she received from [employer], has discontinued use.  Denies respiratory symptoms.  Has attempted use of 50 mg of Benadryl, famotidine and ibuprofen.  Denies drainage, fever.  Past Medical History:  Diagnosis Date   Bipolar disorder (HCC)    Cold sore    Depression    Dysplastic nevus 10/15/2020   Left upper back, severe. Exc 02/03/21 2:30pm.   Encounter for planned induction of labor 12/05/2022   Idiopathic intracranial hypertension    Vaccine for human papilloma virus (HPV) types 6, 11, 16, and 18 administered     Patient Active Problem List   Diagnosis Date Noted   Postpartum care following vaginal delivery 04/07/2023   Obesity complicating pregnancy in third trimester 03/31/2023   Pruritus 03/09/2023   ADD (attention deficit disorder) 10/20/2022   Bipolar disorder (HCC) 10/20/2022   History of migraine 10/20/2022   Obesity in pregnancy 09/21/2022   Need for rhogam due to Rh negative mother 09/21/2022   Pregnancy, supervision, high-risk 08/24/2022   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   Hypertrophy of breast    Idiopathic intracranial hypertension 02/20/2014    Past Surgical History:  Procedure Laterality Date   BREAST REDUCTION SURGERY Bilateral 01/13/2022   Procedure: MAMMARY REDUCTION  (BREAST);  Surgeon: Janne Napoleon, MD;  Location: [facility] OR;  Service: Plastics;  Laterality: Bilateral;   WISDOM TOOTH EXTRACTION      OB History     Gravida  2   Para  1   Term  1   Preterm      AB   1   Living  1      SAB      IAB  1   Ectopic      Multiple  0   Live Births  1        Obstetric Comments  Pt had elective abortion, date unknown.          Home Medications    Prior to Admission medications   Medication Sig Start Date End Date Taking? Authorizing Provider  amphetamine-dextroamphetamine (ADDERALL XR) 20 MG 24 hr capsule Take 1 capsule (20 mg total) by mouth in the morning and at bedtime. 06/04/23  Yes Mozingo, Thereasa Solo, NP  buPROPion (WELLBUTRIN XL) 300 MG 24 hr tablet Take 1 tablet (300 mg total) by mouth daily. 07/28/23  Yes Mozingo, Thereasa Solo, NP  lamoTRIgine (LAMICTAL) 150 MG tablet Take one tablet at bedtime. 06/30/23  Yes Mozingo, Thereasa Solo, NP  predniSONE (STERAPRED UNI-PAK 21 TAB) 10 MG (21) TBPK tablet Take by mouth daily. Take 6 tabs by mouth daily  for 1 days, then 5 tabs for 1 days, then 4 tabs for 1 days, then 3 tabs for 1 days, 2 tabs for 1 days, then 1 tab by mouth daily for 1 days 08/16/23  Yes Allan Bacigalupi R, NP  topiramate (TOPAMAX) 50 MG tablet Take one tablet twice daily. 04/07/23  Yes Mozingo, Thereasa Solo, NP  amphetamine-dextroamphetamine (ADDERALL) 20 MG tablet Take 1 tablet (20 mg total) by mouth daily. 06/30/23   Mozingo, Thereasa Solo, NP  valACYclovir (VALTREX) 1000 MG tablet Take 2 tabs BID x 1 day prn sx Patient taking differently: as needed. Take 2 tabs BID x 1 day prn sx 10/15/20   Copland, Ilona Sorrel, PA-C    Family History Family History  Problem Relation Age of Onset   Healthy Mother    Healthy Father    Heart attack Maternal Grandmother    GI Bleed Maternal Grandfather    Cancer Paternal Grandmother    Healthy Half-Sister    Healthy Half-Sister    Healthy Half-Brother    Breast cancer Neg Hx    Ovarian cancer Neg Hx    Colon cancer Neg Hx    Diabetes Neg Hx     Social History Social History   Tobacco Use   Smoking status: Former    Current packs/day: 0.00    Average packs/day: 0.3  packs/day for 2.0 years (0.5 ttl pk-yrs)    Types: Cigarettes    Start date: 04/29/2019    Quit date: 04/28/2021    Years since quitting: 2.3   Smokeless tobacco: Never  Vaping Use   Vaping status: Former   Quit date: 04/14/2021  Substance Use Topics   Alcohol use: Not Currently    Comment: social   Drug use: No     Allergies   Penicillins   Review of Systems Review of Systems   Physical Exam Triage Vital Signs ED Triage Vitals  Encounter Vitals Group     BP 08/16/23 1015 114/66     Systolic BP Percentile --      Diastolic BP Percentile --      Pulse Rate 08/16/23 1015 90     Resp 08/16/23 1015 17     Temp 08/16/23 1015 98.6 F (37 C)     Temp Source 08/16/23 1015 Oral     SpO2 08/16/23 1015 97 %     Weight --      Height --      Head Circumference --      Peak Flow --      Pain Score 08/16/23 1016 0     Pain Loc --      Pain Education --      Exclude from Growth Chart --    No data found.  Updated Vital Signs BP 114/66 (BP Location: Left Arm)   Pulse 90   Temp 98.6 F (37 C) (Oral)   Resp 17   SpO2 97%   Visual Acuity Right Eye Distance:   Left Eye Distance:   Bilateral Distance:    Right Eye Near:   Left Eye Near:    Bilateral Near:     Physical Exam Constitutional:      Appearance: Normal appearance.  Eyes:     Extraocular Movements: Extraocular movements intact.  Pulmonary:     Effort: Pulmonary effort is normal.  Skin:    Comments: Hives generalized to the body  Neurological:     Mental Status: She is alert and oriented to person, place, and time. Mental status is at baseline.      UC Treatments / Results  Labs (all labs ordered are listed, but only abnormal results are displayed) Labs Reviewed - No data to display  EKG   Radiology No results found.  Procedures Procedures (including critical care time)  Medications Ordered in UC Medications  dexamethasone (DECADRON) injection 10  mg (has no administration in time range)     Initial Impression / Assessment and Plan / UC Course  I have reviewed the triage vital signs and the nursing notes.  Pertinent labs & imaging results that were available during my care of the patient were reviewed by me and considered in my medical decision making (see chart for details).  Hives  Vitals are stable, no signs of systemic reaction, no respiratory symptoms, stable for outpatient management, presentation consistent with hives, no signs of infection, Decadron IM given and prescribed prednisone taper for outpatient use recommended continued use of antihistamines topically or orally for additional supportive care with avoidance of heat to prevent further irritation advised follow-up if symptoms persist worsen or recur Final Clinical Impressions(s) / UC Diagnoses   Final diagnoses:  Hives     Discharge Instructions      Today you are being treated for the allergic reaction most likely to the new shampoo and conditioner  You have been given an injection of steroids today in the office today to help reduce the inflammatory process that occurs with this rash which will help minimize your itching as well as begin to clear  Starting tomorrow take prednisone every morning with food as directed, to continue the above process  You may  use topical calamine or Benadryl cream to help manage itching, you may also continue oral Benadryl, Zyrtec or Claritin  Please avoid long exposures to heat such as a hot steamy shower or being outside as this may cause further irritation to your rash  You may follow-up with his urgent care as needed if symptoms persist or worsen    ED Prescriptions     Medication Sig Dispense Auth. Provider   predniSONE (STERAPRED UNI-PAK 21 TAB) 10 MG (21) TBPK tablet Take by mouth daily. Take 6 tabs by mouth daily  for 1 days, then 5 tabs for 1 days, then 4 tabs for 1 days, then 3 tabs for 1 days, 2 tabs for 1 days, then 1 tab by mouth daily for 1 days 21  tablet Mark Benecke, Elita Boone, NP      PDMP not reviewed this encounter.   Valinda Hoar, NP 08/16/23 1039

## 2023-08-16 NOTE — ED Triage Notes (Signed)
PT reports hives started Sat.

## 2023-08-16 NOTE — Telephone Encounter (Signed)
Emily Hernandez started taking the Lamictal 150 mg and said that she has hives all over her body and they are getting worse. Please call her at [phone]. She has taken Benadryl.

## 2023-08-16 NOTE — Discharge Instructions (Signed)
Today you are being treated for the allergic reaction most likely to the new shampoo and conditioner  You have been given an injection of steroids today in the office today to help reduce the inflammatory process that occurs with this rash which will help minimize your itching as well as begin to clear  Starting tomorrow take prednisone every morning with food as directed, to continue the above process  You may  use topical calamine or Benadryl cream to help manage itching, you may also continue oral Benadryl, Zyrtec or Claritin  Please avoid long exposures to heat such as a hot steamy shower or being outside as this may cause further irritation to your rash  You may follow-up with his urgent care as needed if symptoms persist or worsen

## 2023-08-25 ENCOUNTER — Encounter: Payer: Self-pay | Admitting: Adult Health

## 2023-08-25 ENCOUNTER — Telehealth: Payer: Medicaid Other | Admitting: Adult Health

## 2023-08-25 DIAGNOSIS — F411 Generalized anxiety disorder: Secondary | ICD-10-CM

## 2023-08-25 DIAGNOSIS — F319 Bipolar disorder, unspecified: Secondary | ICD-10-CM | POA: Diagnosis not present

## 2023-08-25 DIAGNOSIS — F909 Attention-deficit hyperactivity disorder, unspecified type: Secondary | ICD-10-CM | POA: Diagnosis not present

## 2023-08-25 DIAGNOSIS — F902 Attention-deficit hyperactivity disorder, combined type: Secondary | ICD-10-CM

## 2023-08-25 DIAGNOSIS — G4700 Insomnia, unspecified: Secondary | ICD-10-CM | POA: Diagnosis not present

## 2023-08-25 MED ORDER — TOPIRAMATE 50 MG PO TABS
ORAL_TABLET | ORAL | 2 refills | Status: DC
Start: 2023-08-25 — End: 2023-12-06

## 2023-08-25 MED ORDER — BUPROPION HCL ER (XL) 300 MG PO TB24
300.0000 mg | ORAL_TABLET | Freq: Every day | ORAL | 2 refills | Status: DC
Start: 2023-08-25 — End: 2023-12-14

## 2023-08-25 MED ORDER — AMPHETAMINE-DEXTROAMPHETAMINE 20 MG PO TABS: 20.0000 mg | ORAL_TABLET | Freq: Every day | ORAL | 0 refills | Status: DC

## 2023-08-25 MED ORDER — AMPHETAMINE-DEXTROAMPHETAMINE 20 MG PO TABS
20.0000 mg | ORAL_TABLET | Freq: Every day | ORAL | 0 refills | Status: DC
Start: 2023-08-25 — End: 2023-12-14

## 2023-08-25 NOTE — Progress Notes (Signed)
 Emily Hernandez [id number] 1996/01/28 28 y.o.  Virtual Visit via Video Note  I connected with pt @ on 08/25/23 at  8:30 AM EST by a video enabled telemedicine application and verified that I am speaking with the correct person using two identifiers.   I discussed the limitations of evaluation and management by telemedicine and the availability of in person appointments. The patient expressed understanding and agreed to proceed.  I discussed the assessment and treatment plan with the patient. The patient was provided an opportunity to ask questions and all were answered. The patient agreed with the plan and demonstrated an understanding of the instructions.   The patient was advised to call back or seek an in-person evaluation if the symptoms worsen or if the condition fails to improve as anticipated.  I provided 25 minutes of non-face-to-face time during this encounter.  The patient was located at home.  The provider was located at [facility].   [name], NP   Subjective:   Patient ID:  Emily Hernandez is a 28 y.o. (DOB September 20, 1995) female.  Chief Complaint: No chief complaint on file.   HPI Emily Hernandez presents for follow-up of ADHD, MDD, GAD, insomnia, and Bipolar disorder.   Describes mood today as about the same. Pleasant. Reports tearfulness. Mood symptoms - reports increased anxiety, depression, and irritability. Denies panic attacks. Reports worry, rumination and over thinking. Reports feeling overwhelmed. Reports mood is up and down. Denies feeling manic. Stating I feel like I'm doing better. Feels like the current medications are helpful. Varying interest and motivation. Taking other medications as prescribed. Energy levels lower. Active, does not have a regular exercise routine.  Enjoys some usual interests and activities. Lives with boyfriend and her 25 month old son. She and boyfriend doing well - together 3 years. Mother local and  supportive.  Appetite adequate. Weight stable. Sleeps better some nights than others. Averages 4 solid hours. Also reports broken sleep - up and down throughout with son. Focus and concentration improved - has restarted Adderall 20mg  daily and feels like it is helpful. Completing tasks. Managing aspects of household. Working 2 days a week. Denies SI or HI. Denies AH or VH. Denies self harm. Denies substance use.   Previous medication trials: Latuda, Abilify , Lamictal , Topamax , Vraylar .   Review of Systems:  Review of Systems  Musculoskeletal:  Negative for gait problem.  Neurological:  Negative for tremors.  Psychiatric/Behavioral:         Please refer to HPI    Medications: I have reviewed the patient's current medications.  Current Outpatient Medications  Medication Sig Dispense Refill   amphetamine -dextroamphetamine  (ADDERALL XR) 20 MG 24 hr capsule Take 1 capsule (20 mg total) by mouth in the morning and at bedtime. 60 capsule 0   amphetamine -dextroamphetamine  (ADDERALL) 20 MG tablet Take 1 tablet (20 mg total) by mouth daily. 30 tablet 0   buPROPion  (WELLBUTRIN  XL) 300 MG 24 hr tablet Take 1 tablet (300 mg total) by mouth daily. 30 tablet 2   lamoTRIgine  (LAMICTAL ) 150 MG tablet Take one tablet at bedtime. 30 tablet 2   predniSONE  (STERAPRED UNI-PAK 21 TAB) 10 MG (21) TBPK tablet Take by mouth daily. Take 6 tabs by mouth daily  for 1 days, then 5 tabs for 1 days, then 4 tabs for 1 days, then 3 tabs for 1 days, 2 tabs for 1 days, then 1 tab by mouth daily for 1 days 21 tablet 0   topiramate  (TOPAMAX ) 50 MG  tablet Take one tablet twice daily. 60 tablet 2   valACYclovir  (VALTREX ) 1000 MG tablet Take 2 tabs BID x 1 day prn sx (Patient taking differently: as needed. Take 2 tabs BID x 1 day prn sx) 30 tablet 1   No current facility-administered medications for this visit.    Medication Side Effects: None  Allergies:  Allergies  Allergen Reactions   Penicillins Hives, Swelling  and Other (See Comments)    Swelling and Hives        Hives, throat swells    Past Medical History:  Diagnosis Date   Bipolar disorder (HCC)    Cold sore    Depression    Dysplastic nevus 10/15/2020   Left upper back, severe. Exc 02/03/21 2:30pm.   Encounter for planned induction of labor 12/05/2022   Idiopathic intracranial hypertension    Vaccine for human papilloma virus (HPV) types 6, 11, 16, and 18 administered     Family History  Problem Relation Age of Onset   Healthy Mother    Healthy Father    Heart attack Maternal Grandmother    GI Bleed Maternal Grandfather    Cancer Paternal Grandmother    Healthy Half-Sister    Healthy Half-Sister    Healthy Half-Brother    Breast cancer Neg Hx    Ovarian cancer Neg Hx    Colon cancer Neg Hx    Diabetes Neg Hx     Social History   Socioeconomic History   Marital status: Significant Other    Spouse name: Government Social Research Officer   Number of children: 0   Years of education: 14   Highest education level: Some college, no degree  Occupational History   Occupation: [employer]  Tobacco Use   Smoking status: Former    Current packs/day: 0.00    Average packs/day: 0.3 packs/day for 2.0 years (0.5 ttl pk-yrs)    Types: Cigarettes    Start date: 04/29/2019    Quit date: 04/28/2021    Years since quitting: 2.3   Smokeless tobacco: Never  Vaping Use   Vaping status: Former   Quit date: 04/14/2021  Substance and Sexual Activity   Alcohol use: Not Currently    Comment: social   Drug use: No   Sexual activity: Not Currently    Partners: Male    Birth control/protection: Injection  Other Topics Concern   Not on file  Social History Narrative   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   Social Drivers of Corporate Investment Banker Strain: Low Risk  (08/24/2022)   Overall Financial Resource Strain (CARDIA)    Difficulty of Paying Living Expenses: Not  very hard  Food Insecurity: No Food Insecurity (03/31/2023)   Hunger Vital Sign    Worried About Running Out of Food in the Last Year: Never true    Ran Out of Food in the Last Year: Never true  Transportation Needs: No Transportation Needs (03/31/2023)   PRAPARE - Administrator, Civil Service (Medical): No    Lack of Transportation (Non-Medical): No  Physical Activity: Insufficiently Active (08/24/2022)   Exercise Vital Sign    Days of Exercise per Week: 2 days    Minutes of Exercise per Session: 30 min  Stress: No Stress Concern Present (08/24/2022)   [employer] of Occupational Health - Occupational Stress Questionnaire    Feeling of Stress : Not at all  Social Connections: Moderately Isolated (08/24/2022)   Social Connection and Isolation Panel [NHANES]    Frequency of Communication with Friends and Family: More than three times a week    Frequency of Social Gatherings with Friends and Family: Once a week    Attends Religious Services: Never    Database Administrator or Organizations: No    Attends Banker Meetings: Never    Marital Status: Living with partner  Intimate Partner Violence: Not At Risk (03/31/2023)   Humiliation, Afraid, Rape, and Kick questionnaire    Fear of Current or Ex-Partner: No    Emotionally Abused: No    Physically Abused: No    Sexually Abused: No    Past Medical History, Surgical history, Social history, and Family history were reviewed and updated as appropriate.   Please see review of systems for further details on the patient's review from today.   Objective:   Physical Exam:  There were no vitals taken for this visit.  Physical Exam Constitutional:      General: She is not in acute distress. Musculoskeletal:        General: No deformity.  Neurological:     Mental Status: She is alert and oriented to person, place, and time.     Coordination: Coordination normal.  Psychiatric:        Attention and Perception:  Attention and perception normal. She does not perceive auditory or visual hallucinations.        Mood and Affect: Mood normal. Mood is not anxious or depressed. Affect is not labile, blunt, angry or inappropriate.        Speech: Speech normal.        Behavior: Behavior normal.        Thought Content: Thought content normal. Thought content is not paranoid or delusional. Thought content does not include homicidal or suicidal ideation. Thought content does not include homicidal or suicidal plan.        Cognition and Memory: Cognition and memory normal.        Judgment: Judgment normal.     Comments: Insight intact     Lab Review:     Component Value Date/Time   NA 139 04/10/2022 1051   K 3.8 04/10/2022 1051   CL 112 (H) 04/10/2022 1051   CO2 23 04/10/2022 1051   GLUCOSE 96 04/10/2022 1051   BUN 12 04/10/2022 1051   CREATININE 0.81 04/10/2022 1051   CREATININE 0.75 01/30/2014 1309   CALCIUM 9.5 04/10/2022 1051   PROT 7.3 04/10/2022 1051   ALBUMIN 4.5 04/10/2022 1051   AST 9 03/09/2023 0830   ALT 8 03/09/2023 0830   ALKPHOS 41 04/10/2022 1051   BILITOT 0.5 04/10/2022 1051   GFRNONAA >60 04/10/2022 1051   GFRAA >60 05/01/2019 1303       Component Value Date/Time   WBC 13.7 (H) 04/02/2023 0418   RBC 3.16 (L) 04/02/2023 0418   HGB 8.7 (L) 04/02/2023 0418   HGB 11.0 (L) 01/12/2023 0914   HCT 26.8 (L) 04/02/2023 0418   HCT 33.8 (L) 01/12/2023 0914   PLT 186 04/02/2023 0418   PLT 178 01/12/2023 0914   MCV 84.8 04/02/2023 0418   MCV 89 01/12/2023 0914   MCH 27.5 04/02/2023 0418   MCHC 32.5 04/02/2023 0418   RDW 14.2 04/02/2023 0418   RDW 11.9 01/12/2023 0914   LYMPHSABS 1.3 01/12/2023 0914   MONOABS 0.4 09/03/2008 2035   EOSABS 0.1 01/12/2023 0914   BASOSABS 0.0 01/12/2023  0914    No results found for: POCLITH, LITHIUM   No results found for: PHENYTOIN, PHENOBARB, VALPROATE, CBMZ   .res Assessment: Plan:    Plan:  D/C Lamictal  150mg  at hs  Adderall  20mg  daily  Topamax  100mg  daily Wellbutrin  XL 300mg  every morning  Patient advised to contact office with any questions, adverse effects, or acute worsening in signs and symptoms.  Counseled patient regarding potential benefits, risks, and side effects of Lamictal  to include potential risk of Stevens-Johnson syndrome. Advised patient to stop taking Lamictal  and contact office immediately if rash develops and to seek urgent medical attention if rash is severe and/or spreading quickly.  Discussed potential benefits, risks, and side effects of stimulants with patient to include increased heart rate, palpitations, insomnia, increased anxiety, increased irritability, or decreased appetite.  Instructed patient to contact office if experiencing any significant tolerability issues.   There are no diagnoses linked to this encounter.   Please see After Visit Summary for patient specific instructions.  Future Appointments  Date Time Provider Department Center  08/25/2023  8:30 AM Jakara Blatter, Angeline Mattocks, NP [facility] None  09/02/2023  2:30 PM Jeneal Danita Macintosh, MD [facility] None  09/17/2023  8:15 AM [facility] None    No orders of the defined types were placed in this encounter.     -------------------------------

## 2023-09-02 ENCOUNTER — Ambulatory Visit (INDEPENDENT_AMBULATORY_CARE_PROVIDER_SITE_OTHER): Payer: Medicaid Other | Admitting: Allergy

## 2023-09-02 ENCOUNTER — Encounter: Payer: Self-pay | Admitting: Allergy

## 2023-09-02 ENCOUNTER — Other Ambulatory Visit: Payer: Self-pay

## 2023-09-02 VITALS — BP 122/86 | HR 99 | Temp 98.7°F | Resp 20 | Ht 64.0 in | Wt 201.8 lb

## 2023-09-02 DIAGNOSIS — L509 Urticaria, unspecified: Secondary | ICD-10-CM | POA: Diagnosis not present

## 2023-09-02 DIAGNOSIS — Z8669 Personal history of other diseases of the nervous system and sense organs: Secondary | ICD-10-CM | POA: Diagnosis not present

## 2023-09-02 NOTE — Patient Instructions (Signed)
Acute Urticaria Presented with a 5-day episode of generalized, itchy, red, raised hives postpartum and post-COVID-19 infection. No recurrence since the initial episode. No associated joint pain or fever. -Order labs to evaluate for potential triggers including food allergies, alpha-gal allergy, and autoimmune hives. -If hives recur, advised to take Zyrtec and Pepcid both twice a day until hives resolve, and consider adding Singulair if symptoms persist despite antihistamines.  Recurrent Ear Infections Reported ear infections in the months preceding the hives episode.  Ear infections as an adult is not normal.  -Evaluate immune function, specifically strep titer protection, to identify potential cause of recurrent infections.  Potential Contact Dermatitis Discussed potential reaction to shampoo/conditioner. -If lab work for hives is normal, consider patch testing to rule out contact dermatitis. Contact dermatitis (contact allergy) - patch testing is the test of choice to evaluate for contact dermatitis.  Recommend performing patch testing with the TRUE test patch panels.  Patches are best placed on a Monday with return to office on Wednesday and Friday of same week for readings.  Once patches are in place to do not get them wet.  You can take antihistamines while patches are in place.   Would bring in your shampoo and conditioner to patch to these.   True Test looks for the following sensitivities:     Follow-up in 6 months or sooner if needed for patch testing

## 2023-09-02 NOTE — Progress Notes (Signed)
New Patient Note  RE: Emily Hernandez MRN: [id number] DOB: [birth year] Date of Office Visit: 09/02/2023  Primary care provider: Wilfrid Lund, PA  Chief Complaint: Hives  History of present illness: Emily Hernandez is a 28 y.o. female presenting today for evaluation of urticaria. Discussed the use of AI scribe software for clinical note transcription with the patient, who gave verbal consent to proceed.  She had an episode of hives that occurred a few days after Christmas, approximately five months postpartum. The hives were described as itchy, red, and raised, distributed all over the body, and persisted for about five days. The patient denied any prior history of hives. There was no associated joint pain, fever, or residual bruising marks from the hives.  In the months leading up to the hives episode, the patient had experienced two ear infections and a COVID-19 infection. The first ear infection occurred in mid-October, the second in mid-November, and the COVID-19 infection in late November. The hives appeared at the end of December, approximately a month after the COVID-19 infection.  The patient also reported a history of frequent ear infections since childhood, with at least one or two episodes occurring annually.  The patient had been on Lamictal for over ten years, but had stopped taking it during pregnancy. The patient resumed taking Lamictal after delivery, a few months before the hives episode.  She did stop the Lamictal again with the hives and has not resumed use.  The patient's sister had a severe reaction to Lamictal, resulting in Stevens-Johnson syndrome and DRESS syndrome but she was a little nervous to restart this medication due to that.  The patient reported no known food allergies.   The day prior to the onset of hives she states she ate a peanut bar that a relative made her she patient does not really like peanut or nuts in general she does not really eat nuts in her  diet but she was being nice.  So this was new for her.  She also states that she used a new shampoo and conditioner and the first thing she noted when her symptoms started it was her scalp became itchy before she developed hives throughout the body.  She has not used the shampoo or conditioner since.  She also states she tried to fill out what was going on and she did do some dietary eliminations of dairy, wheat and red meat for about a week or 2 and then she did reintroduce these foods back in her diet 1 at a time and she did not have any return of hives.  She denies any known tick bites. The patient had been managing postpartum depression and anxiety so there has been increased stress levels.  When the hives developed she did go to the ED twice back-to-back days.  First visit was on 08/16/2023 where she was treated with a Decadron injection when she was prescribed prednisone with a taper.  On 08/17/2023 ED visit she was recommended to take Zyrtec and Pepcid.  She did not complete the prednisone taper only taking 3 tablets as she felt like it was making the hives worse. CBC done in this number by PCP only showed a neutrophil count of 7.4 which was reactive from recent steroid use.  CMP showed elevated glucose as well as chloride and CO2 levels were lower than the lower limit.  Review of systems: 10pt ROS negative unless noted above in HPI  All other systems negative unless noted above  in HPI  Past medical history: Past Medical History:  Diagnosis Date   Bipolar disorder (HCC)    Cold sore    Depression    Dysplastic nevus 10/15/2020   Left upper back, severe. Exc 02/03/21 2:30pm.   Encounter for planned induction of labor 12/05/2022   Idiopathic intracranial hypertension    Urticaria    Vaccine for human papilloma virus (HPV) types 6, 11, 16, and 18 administered     Past surgical history: Past Surgical History:  Procedure Laterality Date   BREAST REDUCTION SURGERY Bilateral 01/13/2022    Procedure: MAMMARY REDUCTION  (BREAST);  Surgeon: Janne Napoleon, MD;  Location: [facility] OR;  Service: Plastics;  Laterality: Bilateral;   TYMPANOSTOMY TUBE PLACEMENT     WISDOM TOOTH EXTRACTION      Family history:  Family History  Problem Relation Age of Onset   Allergic rhinitis Mother    Healthy Mother    Healthy Father    Heart attack Maternal Grandmother    GI Bleed Maternal Grandfather    Cancer Paternal Grandmother    Healthy Half-Brother    Urticaria Half-Sister    Allergic rhinitis Half-Sister    Healthy Half-Sister    Asthma Half-Sister    Allergic rhinitis Half-Sister    Healthy Half-Sister    Breast cancer Neg Hx    Ovarian cancer Neg Hx    Colon cancer Neg Hx    Diabetes Neg Hx    Angioedema Neg Hx    Eczema Neg Hx     Social history: Lives in a home without carpeting with gas heating and central cooling.  Cats and dogs in the home.  There is probably more damage or mildew in the home.  No concern for roaches in the home.  She is a Leisure centre manager. Tobacco Use   Smoking status: Former    Current packs/day: 0.00    Average packs/day: 0.3 packs/day for 2.0 years (0.5 ttl pk-yrs)    Types: Cigarettes    Start date: 04/29/2019    Quit date: 04/28/2021    Years since quitting: 2.3    Passive exposure: Past   Smokeless tobacco: Never  Vaping Use   Vaping status: Every Day   Last attempt to quit: 04/14/2021   Substances: Nicotine     Medication List: Current Outpatient Medications  Medication Sig Dispense Refill   amphetamine-dextroamphetamine (ADDERALL) 20 MG tablet Take 1 tablet (20 mg total) by mouth daily. 30 tablet 0   [START ON 09/22/2023] amphetamine-dextroamphetamine (ADDERALL) 20 MG tablet Take 1 tablet (20 mg total) by mouth daily. 30 tablet 0   buPROPion (WELLBUTRIN XL) 300 MG 24 hr tablet Take 1 tablet (300 mg total) by mouth daily. 30 tablet 2   predniSONE (STERAPRED UNI-PAK 21 TAB) 10 MG (21) TBPK tablet Take by mouth daily. Take 6 tabs by mouth daily  for  1 days, then 5 tabs for 1 days, then 4 tabs for 1 days, then 3 tabs for 1 days, 2 tabs for 1 days, then 1 tab by mouth daily for 1 days 21 tablet 0   topiramate (TOPAMAX) 50 MG tablet Take one tablet twice daily. 60 tablet 2   valACYclovir (VALTREX) 1000 MG tablet Take 2 tabs BID x 1 day prn sx (Patient not taking: Reported on 09/02/2023) 30 tablet 1   No current facility-administered medications for this visit.    Known medication allergies: Allergies  Allergen Reactions   Penicillins Hives, Other (See Comments) and Swelling    Swelling and  Hives        Hives, throat swells     Physical examination: Blood pressure 122/86, pulse 99, temperature 98.7 F (37.1 C), temperature source Temporal, resp. rate 20, height 5\' 4"  (1.626 m), weight 201 lb 12.8 oz (91.5 kg), SpO2 99%, unknown if currently breastfeeding.  General: Alert, interactive, in no acute distress. HEENT: PERRLA, TMs pearly gray, turbinates non-edematous without discharge, post-pharynx non erythematous. Neck: Supple without lymphadenopathy. Lungs: Clear to auscultation without wheezing, rhonchi or rales. {no increased work of breathing. CV: Normal S1, S2 without murmurs. Abdomen: Nondistended, nontender. Skin: Warm and dry, without lesions or rashes. Extremities:  No clubbing, cyanosis or edema. Neuro:   Grossly intact.  Diagnositics/Labs: Labs: See HPI  Assessment and plan:   Acute Urticaria Presented with a 5-day episode of generalized, itchy, red, raised hives postpartum and post-COVID-19 infection. No recurrence since the initial episode. No associated joint pain or fever. -Order labs to evaluate for potential triggers including food allergies, alpha-gal allergy, and autoimmune hives. -If hives recur, advised to take Zyrtec and Pepcid both twice a day until hives resolve, and consider adding Singulair if symptoms persist despite antihistamines.  Recurrent Ear Infections Reported ear infections in the months  preceding the hives episode.  Ear infections as an adult is not normal.  -Evaluate immune function, specifically strep titer protection, to identify potential cause of recurrent infections.  Potential Contact Dermatitis Discussed potential reaction to shampoo/conditioner. -If lab work for hives is normal, consider patch testing to rule out contact dermatitis. Contact dermatitis (contact allergy) - patch testing is the test of choice to evaluate for contact dermatitis.  Recommend performing patch testing with the TRUE test patch panels.  Patches are best placed on a Monday with return to office on Wednesday and Friday of same week for readings.  Once patches are in place to do not get them wet.  You can take antihistamines while patches are in place.   Would bring in your shampoo and conditioner to patch to these.   Follow-up in 6 months or sooner if needed for patch testing  I appreciate the opportunity to take part in Emily Hernandez's care. Please do not hesitate to contact me with questions.  Sincerely,   Margo Aye, MD Allergy/Immunology [facility]

## 2023-09-02 NOTE — Progress Notes (Signed)
Patient got blood work drawn. Patient had vasovagel and was advised to stay in the room and eat and drink. Crackers and Gingerale were provided. Patient left prior to being cleared to leave.   I called the patient and spoke with her she verbalized she was doing okay and was feeling better.

## 2023-09-10 LAB — COMPREHENSIVE METABOLIC PANEL
ALT: 15 [IU]/L (ref 0–32)
AST: 14 [IU]/L (ref 0–40)
Albumin: 4.7 g/dL (ref 4.0–5.0)
Alkaline Phosphatase: 65 [IU]/L (ref 44–121)
BUN/Creatinine Ratio: 18 (ref 9–23)
BUN: 14 mg/dL (ref 6–20)
Bilirubin Total: 0.7 mg/dL (ref 0.0–1.2)
CO2: 17 mmol/L — ABNORMAL LOW (ref 20–29)
Calcium: 10 mg/dL (ref 8.7–10.2)
Chloride: 110 mmol/L — ABNORMAL HIGH (ref 96–106)
Creatinine, Ser: 0.8 mg/dL (ref 0.57–1.00)
Globulin, Total: 2.3 g/dL (ref 1.5–4.5)
Glucose: 80 mg/dL (ref 70–99)
Potassium: 4.3 mmol/L (ref 3.5–5.2)
Sodium: 140 mmol/L (ref 134–144)
Total Protein: 7 g/dL (ref 6.0–8.5)
eGFR: 102 mL/min/{1.73_m2} (ref >59)

## 2023-09-10 LAB — IGG, IGA, IGM
IgA/Immunoglobulin A, Serum: 157 mg/dL (ref 87–352)
IgG (Immunoglobin G), Serum: 843 mg/dL (ref 586–1602)
IgM (Immunoglobulin M), Srm: 101 mg/dL (ref 26–217)

## 2023-09-10 LAB — IGE NUT PROF. W/COMPONENT RFLX
F017-IgE Hazelnut (Filbert): <0.1 kU/L
F018-IgE Brazil Nut: <0.1 kU/L
F020-IgE Almond: <0.1 kU/L
F202-IgE Cashew Nut: <0.1 kU/L
F203-IgE Pistachio Nut: <0.1 kU/L
F256-IgE Walnut: <0.1 kU/L
Macadamia Nut, IgE: <0.1 kU/L
Peanut, IgE: <0.1 kU/L
Pecan Nut IgE: <0.1 kU/L

## 2023-09-10 LAB — DIPHTHERIA / TETANUS ANTIBODY PANEL
Diphtheria Ab: 0.54 [IU]/mL (ref <0.10)
Tetanus Ab, IgG: 2.4 [IU]/mL (ref <0.10)

## 2023-09-10 LAB — STREP PNEUMONIAE 23 SEROTYPES IGG
Pneumo Ab Type 1*: 0.1 ug/mL — ABNORMAL LOW (ref >1.3)
Pneumo Ab Type 12 (12F)*: <0.1 ug/mL — ABNORMAL LOW (ref >1.3)
Pneumo Ab Type 14*: <0.1 ug/mL — ABNORMAL LOW (ref >1.3)
Pneumo Ab Type 17 (17F)*: 0.9 ug/mL — ABNORMAL LOW (ref >1.3)
Pneumo Ab Type 19 (19F)*: <0.1 ug/mL — ABNORMAL LOW (ref >1.3)
Pneumo Ab Type 2*: <0.2 ug/mL — ABNORMAL LOW (ref >1.3)
Pneumo Ab Type 20*: 0.3 ug/mL — ABNORMAL LOW (ref >1.3)
Pneumo Ab Type 22 (22F)*: <0.1 ug/mL — ABNORMAL LOW (ref >1.3)
Pneumo Ab Type 23 (23F)*: <0.1 ug/mL — ABNORMAL LOW (ref >1.3)
Pneumo Ab Type 26 (6B)*: 1.1 ug/mL — ABNORMAL LOW (ref >1.3)
Pneumo Ab Type 3*: <0.1 ug/mL — ABNORMAL LOW (ref >1.3)
Pneumo Ab Type 34 (10A)*: <0.1 ug/mL — ABNORMAL LOW (ref >1.3)
Pneumo Ab Type 4*: <0.1 ug/mL — ABNORMAL LOW (ref >1.3)
Pneumo Ab Type 43 (11A)*: 0.2 ug/mL — ABNORMAL LOW (ref >1.3)
Pneumo Ab Type 5*: <0.1 ug/mL — ABNORMAL LOW (ref >1.3)
Pneumo Ab Type 51 (7F)*: 0.1 ug/mL — ABNORMAL LOW (ref >1.3)
Pneumo Ab Type 54 (15B)*: <0.2 ug/mL — ABNORMAL LOW (ref >1.3)
Pneumo Ab Type 56 (18C)*: 0.1 ug/mL — ABNORMAL LOW (ref >1.3)
Pneumo Ab Type 57 (19A)*: 0.4 ug/mL — ABNORMAL LOW (ref >1.3)
Pneumo Ab Type 68 (9V)*: <0.1 ug/mL — ABNORMAL LOW (ref >1.3)
Pneumo Ab Type 70 (33F)*: 0.5 ug/mL — ABNORMAL LOW (ref >1.3)
Pneumo Ab Type 8*: 1.7 ug/mL (ref >1.3)
Pneumo Ab Type 9 (9N)*: 0.6 ug/mL — ABNORMAL LOW (ref >1.3)

## 2023-09-10 LAB — ALPHA-GAL PANEL
Allergen Lamb IgE: <0.1 kU/L
Beef IgE: <0.1 kU/L
IgE (Immunoglobulin E), Serum: 41 [IU]/mL (ref 6–495)
O215-IgE Alpha-Gal: <0.1 kU/L
Pork IgE: <0.1 kU/L

## 2023-09-10 LAB — ANA W/REFLEX: Anti Nuclear Antibody (ANA): NEGATIVE

## 2023-09-10 LAB — THYROID ANTIBODIES
Thyroglobulin Antibody: <1 [IU]/mL (ref 0.0–0.9)
Thyroperoxidase Ab SerPl-aCnc: <9 [IU]/mL (ref 0–34)

## 2023-09-10 LAB — TRYPTASE: Tryptase: 3.5 ug/L (ref 2.2–13.2)

## 2023-09-10 LAB — SEDIMENTATION RATE: Sed Rate: 10 mm/h (ref 0–32)

## 2023-09-10 LAB — COMPLEMENT, TOTAL: Compl, Total (CH50): >60 U/mL (ref >41)

## 2023-09-10 LAB — CHRONIC URTICARIA: cu index: 3.2 (ref <10)

## 2023-09-17 ENCOUNTER — Ambulatory Visit (INDEPENDENT_AMBULATORY_CARE_PROVIDER_SITE_OTHER): Payer: Medicaid Other

## 2023-09-17 VITALS — BP 108/73 | HR 92 | Ht 64.0 in | Wt 205.0 lb

## 2023-09-17 DIAGNOSIS — Z3042 Encounter for surveillance of injectable contraceptive: Secondary | ICD-10-CM | POA: Diagnosis not present

## 2023-09-17 MED ORDER — MEDROXYPROGESTERONE ACETATE 150 MG/ML IM SUSP
150.0000 mg | Freq: Once | INTRAMUSCULAR | Status: AC
  Administered 2023-09-17: 150 mg via INTRAMUSCULAR

## 2023-09-17 NOTE — Progress Notes (Signed)
    NURSE VISIT NOTE  Subjective:    Patient ID: Emily Hernandez, female    DOB: November 30, 1995, 28 y.o.   MRN: [id number]  HPI  Patient is a 28 y.o. G21P1011 female who presents for depo provera injection.   Objective:    BP 108/73   Pulse 92   Ht 5\' 4"  (1.626 m)   Wt 205 lb (93 kg)   BMI 35.19 kg/m   Last Annual: 02/19/22. Scheduled for 10/11/23. Last pap: 05/14/23. Last Depo-Provera: 06/25/23. Side Effects if any: n/a. Serum HCG indicated? No . Depo-Provera 150 mg IM given by: Georgiana Shore, CMA. Site: Right Deltoid   Assessment:   1. Encounter for Depo-Provera contraception      Plan:   Next appointment due between 12/03/23 and 12/17/23.    Loman Chroman, CMA

## 2023-09-17 NOTE — Patient Instructions (Signed)

## 2023-09-22 ENCOUNTER — Encounter: Payer: Self-pay | Admitting: Allergy

## 2023-09-23 ENCOUNTER — Telehealth: Payer: Medicaid Other | Admitting: Adult Health

## 2023-09-27 ENCOUNTER — Encounter: Payer: Self-pay | Admitting: Adult Health

## 2023-09-27 ENCOUNTER — Telehealth: Payer: Medicaid Other | Admitting: Adult Health

## 2023-09-27 DIAGNOSIS — F902 Attention-deficit hyperactivity disorder, combined type: Secondary | ICD-10-CM | POA: Diagnosis not present

## 2023-09-27 DIAGNOSIS — G4700 Insomnia, unspecified: Secondary | ICD-10-CM | POA: Diagnosis not present

## 2023-09-27 DIAGNOSIS — F411 Generalized anxiety disorder: Secondary | ICD-10-CM

## 2023-09-27 DIAGNOSIS — F319 Bipolar disorder, unspecified: Secondary | ICD-10-CM

## 2023-09-27 NOTE — Progress Notes (Signed)
 Emily Hernandez [id number] 11/13/1995 28 y.o.  Virtual Visit via Video Note  I connected with pt @ on 09/27/23 at 10:30 AM EST by a video enabled telemedicine application and verified that I am speaking with the correct person using two identifiers.   I discussed the limitations of evaluation and management by telemedicine and the availability of in person appointments. The patient expressed understanding and agreed to proceed.  I discussed the assessment and treatment plan with the patient. The patient was provided an opportunity to ask questions and all were answered. The patient agreed with the plan and demonstrated an understanding of the instructions.   The patient was advised to call back or seek an in-person evaluation if the symptoms worsen or if the condition fails to improve as anticipated.  I provided 25 minutes of non-face-to-face time during this encounter.  The patient was located at home.  The provider was located at [facility].   Reagan Camera, NP   Subjective:   Patient ID:  Emily Hernandez is a 28 y.o. (DOB 06-21-1996) female.  Chief Complaint: No chief complaint on file.   HPI [name] presents for follow-up of ADHD, MDD, GAD, insomnia, and Bipolar disorder.   Describes mood today as "not much better". Pleasant. Reports decreased tearfulness. Mood symptoms - reports anxiety, depression, and irritability - "more situational". Denies panic attacks. Reports worry, rumination and over thinking - "mostly about son". Reports feeling overwhelmed. Reports mood is "up and down". Denies feeling manic. Stating "I feel like I'm doing ok". Feels like the current medications are helpful. Varying interest and motivation. Taking other medications as prescribed. Energy levels lower. Active, does not have a regular exercise routine.  Enjoys some usual interests and activities. Lives with boyfriend and son. She and boyfriend doing well - together 3 years.  Mother local and supportive.  Appetite adequate. Weight stable. Sleeping better at night. Averages 8 hour of broken sleep - 4 solid hours - up and down throughout with son. Focus and concentration improved with Adderall 20mg  daily. Completing tasks. Managing aspects of household. Working 3 days a week. Denies SI or HI. Denies AH or VH. Denies self harm. Denies substance use.   Previous medication trials: Latuda, Abilify , Lamictal , Topamax , Vraylar.  Review of Systems:  Review of Systems  Musculoskeletal:  Negative for gait problem.  Neurological:  Negative for tremors.  Psychiatric/Behavioral:         Please refer to HPI    Medications: I have reviewed the patient's current medications.  Current Outpatient Medications  Medication Sig Dispense Refill   amphetamine -dextroamphetamine  (ADDERALL) 20 MG tablet Take 1 tablet (20 mg total) by mouth daily. 30 tablet 0   amphetamine -dextroamphetamine  (ADDERALL) 20 MG tablet Take 1 tablet (20 mg total) by mouth daily. 30 tablet 0   buPROPion  (WELLBUTRIN  XL) 300 MG 24 hr tablet Take 1 tablet (300 mg total) by mouth daily. 30 tablet 2   topiramate  (TOPAMAX ) 50 MG tablet Take one tablet twice daily. 60 tablet 2   valACYclovir  (VALTREX ) 1000 MG tablet Take 2 tabs BID x 1 day prn sx (Patient not taking: Reported on 09/02/2023) 30 tablet 1   No current facility-administered medications for this visit.    Medication Side Effects: None  Allergies:  Allergies  Allergen Reactions   Penicillins Hives, Other (See Comments) and Swelling    Swelling and Hives        Hives, throat swells    Past Medical History:  Diagnosis Date  Bipolar disorder (HCC)    Cold sore    Depression    Dysplastic nevus 10/15/2020   Left upper back, severe. Exc 02/03/21 2:30pm.   Encounter for planned induction of labor 12/05/2022   Idiopathic intracranial hypertension    Urticaria    Vaccine for human papilloma virus (HPV) types 6, 11, 16, and 18 administered      Family History  Problem Relation Age of Onset   Allergic rhinitis Mother    Healthy Mother    Healthy Father    Heart attack Maternal Grandmother    GI Bleed Maternal Grandfather    Cancer Paternal Grandmother    Healthy Half-Brother    Urticaria Half-Sister    Allergic rhinitis Half-Sister    Healthy Half-Sister    Asthma Half-Sister    Allergic rhinitis Half-Sister    Healthy Half-Sister    Breast cancer Neg Hx    Ovarian cancer Neg Hx    Colon cancer Neg Hx    Diabetes Neg Hx    Angioedema Neg Hx    Eczema Neg Hx     Social History   Socioeconomic History   Marital status: Single    Spouse name: Government social research officer   Number of children: 1   Years of education: 14   Highest education level: Some college, no degree  Occupational History   Occupation: [employer]  Tobacco Use   Smoking status: Former    Current packs/day: 0.00    Average packs/day: 0.3 packs/day for 2.0 years (0.5 ttl pk-yrs)    Types: Cigarettes    Start date: 04/29/2019    Quit date: 04/28/2021    Years since quitting: 2.4    Passive exposure: Past   Smokeless tobacco: Never  Vaping Use   Vaping status: Every Day   Last attempt to quit: 04/14/2021   Substances: Nicotine   Substance and Sexual Activity   Alcohol use: Yes    Comment: social   Drug use: No   Sexual activity: Yes    Partners: Male    Birth control/protection: Injection  Other Topics Concern   Not on file  Social History Narrative   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   Social Drivers of Corporate investment banker Strain: Low Risk  (08/24/2022)   Overall Financial Resource Strain (CARDIA)    Difficulty of Paying Living Expenses: Not very hard  Food Insecurity: No Food Insecurity (03/31/2023)   Hunger Vital Sign    Worried About Running Out of Food in the Last Year: Never true    Ran Out of Food in the Last Year: Never true  Transportation  Needs: No Transportation Needs (03/31/2023)   PRAPARE - Administrator, Civil Service (Medical): No    Lack of Transportation (Non-Medical): No  Physical Activity: Insufficiently Active (08/24/2022)   Exercise Vital Sign    Days of Exercise per Week: 2 days    Minutes of Exercise per Session: 30 min  Stress: No Stress Concern Present (08/24/2022)   [employer] of Occupational Health - Occupational Stress Questionnaire    Feeling of Stress : Not at all  Social Connections: Moderately Isolated (08/24/2022)   Social Connection and Isolation Panel [NHANES]    Frequency of Communication with Friends and Family: More than three times a week    Frequency of Social Gatherings with Friends and Family: Once a week    Attends  Religious Services: Never    Active Member of Clubs or Organizations: No    Attends Banker Meetings: Never    Marital Status: Living with partner  Intimate Partner Violence: Not At Risk (03/31/2023)   Humiliation, Afraid, Rape, and Kick questionnaire    Fear of Current or Ex-Partner: No    Emotionally Abused: No    Physically Abused: No    Sexually Abused: No    Past Medical History, Surgical history, Social history, and Family history were reviewed and updated as appropriate.   Please see review of systems for further details on the patient's review from today.   Objective:   Physical Exam:  There were no vitals taken for this visit.  Physical Exam Constitutional:      General: She is not in acute distress. Musculoskeletal:        General: No deformity.  Neurological:     Mental Status: She is alert and oriented to person, place, and time.     Coordination: Coordination normal.  Psychiatric:        Attention and Perception: Attention and perception normal. She does not perceive auditory or visual hallucinations.        Mood and Affect: Affect is not labile, blunt, angry or inappropriate.        Speech: Speech normal.        Behavior:  Behavior normal.        Thought Content: Thought content normal. Thought content is not paranoid or delusional. Thought content does not include homicidal or suicidal ideation. Thought content does not include homicidal or suicidal plan.        Cognition and Memory: Cognition and memory normal.        Judgment: Judgment normal.     Comments: Insight intact     Lab Review:     Component Value Date/Time   NA 140 09/02/2023 1608   K 4.3 09/02/2023 1608   CL 110 (H) 09/02/2023 1608   CO2 17 (L) 09/02/2023 1608   GLUCOSE 80 09/02/2023 1608   GLUCOSE 96 04/10/2022 1051   BUN 14 09/02/2023 1608   CREATININE 0.80 09/02/2023 1608   CREATININE 0.75 01/30/2014 1309   CALCIUM 10.0 09/02/2023 1608   PROT 7.0 09/02/2023 1608   ALBUMIN 4.7 09/02/2023 1608   AST 14 09/02/2023 1608   ALT 15 09/02/2023 1608   ALKPHOS 65 09/02/2023 1608   BILITOT 0.7 09/02/2023 1608   GFRNONAA >60 04/10/2022 1051   GFRAA >60 05/01/2019 1303       Component Value Date/Time   WBC 13.7 (H) 04/02/2023 0418   RBC 3.16 (L) 04/02/2023 0418   HGB 8.7 (L) 04/02/2023 0418   HGB 11.0 (L) 01/12/2023 0914   HCT 26.8 (L) 04/02/2023 0418   HCT 33.8 (L) 01/12/2023 0914   PLT 186 04/02/2023 0418   PLT 178 01/12/2023 0914   MCV 84.8 04/02/2023 0418   MCV 89 01/12/2023 0914   MCH 27.5 04/02/2023 0418   MCHC 32.5 04/02/2023 0418   RDW 14.2 04/02/2023 0418   RDW 11.9 01/12/2023 0914   LYMPHSABS 1.3 01/12/2023 0914   MONOABS 0.4 09/03/2008 2035   EOSABS 0.1 01/12/2023 0914   BASOSABS 0.0 01/12/2023 0914    No results found for: "POCLITH", "LITHIUM"   No results found for: "PHENYTOIN", "PHENOBARB", "VALPROATE", "CBMZ"   .res Assessment: Plan:    Plan:  Adderall 20mg  daily Topamax  100mg  daily - discussed taper back on by 25mg  daily every 5 days. Wellbutrin   XL 300mg  every morning - discussed restarting at 150mg  daily for a week.  RTC 4 weeks  25 minutes spent dedicated to the care of this patient on the date  of this encounter to include pre-visit review of records, ordering of medication, post visit documentation, and face-to-face time with the patient discussing ADHD, GAD, insomnia, and Bipolar disorder.  Discussed continuing current medication regimen.  Patient advised to contact office with any questions, adverse effects, or acute worsening in signs and symptoms.  Counseled patient regarding potential benefits, risks, and side effects of Lamictal  to include potential risk of Stevens-Johnson syndrome. Advised patient to stop taking Lamictal  and contact office immediately if rash develops and to seek urgent medical attention if rash is severe and/or spreading quickly.  Discussed potential benefits, risks, and side effects of stimulants with patient to include increased heart rate, palpitations, insomnia, increased anxiety, increased irritability, or decreased appetite.  Instructed patient to contact office if experiencing any significant tolerability issues.  Diagnoses and all orders for this visit:  Bipolar I disorder (HCC)  Insomnia, unspecified type  Attention deficit hyperactivity disorder (ADHD), combined type  Generalized anxiety disorder     Please see After Visit Summary for patient specific instructions.  Future Appointments  Date Time Provider Department Center  10/11/2023  9:15 AM Free, Verita Glassman, CNM [facility] None  12/06/2023  9:15 AM AOB-NURSE [facility] None    No orders of the defined types were placed in this encounter.     -------------------------------

## 2023-10-11 ENCOUNTER — Ambulatory Visit (INDEPENDENT_AMBULATORY_CARE_PROVIDER_SITE_OTHER): Payer: Medicaid Other

## 2023-10-11 VITALS — BP 122/86 | HR 98 | Ht 63.0 in | Wt 203.4 lb

## 2023-10-11 DIAGNOSIS — Z01419 Encounter for gynecological examination (general) (routine) without abnormal findings: Secondary | ICD-10-CM | POA: Diagnosis not present

## 2023-10-11 DIAGNOSIS — Z3009 Encounter for other general counseling and advice on contraception: Secondary | ICD-10-CM

## 2023-10-11 NOTE — Assessment & Plan Note (Signed)
-   Discussed permanent nature of BTL, patient confirms she desires permanent contraception.  - Medicaid consent signed today, will make appointment with MD for scheduling BTL.

## 2023-10-11 NOTE — Progress Notes (Signed)
 Outpatient Gynecology Note: Annual Visit  Assessment/Plan:    Emily Hernandez is a 28 y.o. female G30P1011 with normal well-woman gynecologic exam.   Sterilization consult - Discussed permanent nature of BTL, patient confirms she desires permanent contraception.  - Medicaid consent signed today, will make appointment with MD for scheduling BTL.   Well woman exam - Reviewed health maintenance topics as documented below. - Most recent pap smear 04/2023, NILM, recommend repeat in one year due to previous history.     No orders of the defined types were placed in this encounter.  Current Outpatient Medications  Medication Instructions   amphetamine-dextroamphetamine (ADDERALL) 20 MG tablet 20 mg, Oral, Daily   amphetamine-dextroamphetamine (ADDERALL) 20 MG tablet 20 mg, Oral, Daily   buPROPion (WELLBUTRIN XL) 150 MG 24 hr tablet 1 tablet, Oral, Daily   buPROPion (WELLBUTRIN XL) 300 mg, Oral, Daily   topiramate (TOPAMAX) 50 MG tablet Take one tablet twice daily.   valACYclovir (VALTREX) 1000 MG tablet Take 2 tabs BID x 1 day prn sx    No follow-ups on file.    Subjective:    Emily Hernandez is a 28 y.o. female G2P1011 who presents for annual wellness visit.   Occupation Leisure centre manager    Lives with kids and husband    CONCERNS? Desires BTL, has been on Depo shot for years prior to getting pregnant, has not started having some spotting.   Has breast reduction in 2023, has had some ongoing breast pain since that time.   Well Woman Visit:  GYN HISTORY:  No LMP recorded. Patient has had an injection.     Menstrual History: OB History     Gravida  2   Para  1   Term  1   Preterm      AB  1   Living  1      SAB      IAB  1   Ectopic      Multiple  0   Live Births  1        Obstetric Comments  Pt had elective abortion, date unknown.         Menarche age: 89 No LMP recorded. Patient has had an injection.   Urinary incontinence? no  Sexually  active: yes Number of sexual partners: one Gender of sexual Partners: male Dyspareunia? no Last pap:was normal  in 9/24 History of abnormal Pap: in 2023, LSIL, normal colpo Gardasil series:  yes STI history: no STI/HIV testing or immunizations needed? No.  Contraceptive methods: Depo-Provera, desires BTL   Health Maintenance > Reviewed breast self-awareness, no family history of breast cancer or ovarian cancer > History of abnormal mammogram: No > Exercise: walking, moderately active > Dietary Supplements: biotin > Body mass index is 36.03 kg/m.  > Recent dental visit No. > Seat Belt Use: Yes.   > Texting and driving? No. > Concern for alcohol abuse? No, drinks rarely   Tobacco or other drug use: vaping Tobacco Use: Medium Risk (10/11/2023)   Patient History    Smoking Tobacco Use: Former    Smokeless Tobacco Use: Never    Passive Exposure: Past     _________________________________________________________  Current Outpatient Medications  Medication Sig Dispense Refill   amphetamine-dextroamphetamine (ADDERALL) 20 MG tablet Take 1 tablet (20 mg total) by mouth daily. 30 tablet 0   amphetamine-dextroamphetamine (ADDERALL) 20 MG tablet Take 1 tablet (20 mg total) by mouth daily. 30 tablet 0   buPROPion (WELLBUTRIN XL) 300  MG 24 hr tablet Take 1 tablet (300 mg total) by mouth daily. 30 tablet 2   topiramate (TOPAMAX) 50 MG tablet Take one tablet twice daily. 60 tablet 2   valACYclovir (VALTREX) 1000 MG tablet Take 2 tabs BID x 1 day prn sx 30 tablet 1   buPROPion (WELLBUTRIN XL) 150 MG 24 hr tablet Take 1 tablet by mouth daily.     No current facility-administered medications for this visit.   Allergies  Allergen Reactions   Penicillins Hives, Other (See Comments) and Swelling    Swelling and Hives        Hives, throat swells    Past Medical History:  Diagnosis Date   Bipolar disorder (HCC)    Cold sore    Depression    Dysplastic nevus 10/15/2020   Left upper  back, severe. Exc 02/03/21 2:30pm.   Encounter for planned induction of labor 12/05/2022   Idiopathic intracranial hypertension    Urticaria    Vaccine for human papilloma virus (HPV) types 6, 11, 16, and 18 administered    Past Surgical History:  Procedure Laterality Date   BREAST REDUCTION SURGERY Bilateral 01/13/2022   Procedure: MAMMARY REDUCTION  (BREAST);  Surgeon: Janne Napoleon, MD;  [facility];  Service: Plastics;  Laterality: Bilateral;   TYMPANOSTOMY TUBE PLACEMENT     WISDOM TOOTH EXTRACTION     OB History     Gravida  2   Para  1   Term  1   Preterm      AB  1   Living  1      SAB      IAB  1   Ectopic      Multiple  0   Live Births  1        Obstetric Comments  Pt had elective abortion, date unknown.        Social History   Tobacco Use   Smoking status: Former    Current packs/day: 0.00    Average packs/day: 0.3 packs/day for 2.0 years (0.5 ttl pk-yrs)    Types: Cigarettes    Start date: 04/29/2019    Quit date: 04/28/2021    Years since quitting: 2.4    Passive exposure: Past   Smokeless tobacco: Never  Substance Use Topics   Alcohol use: Yes    Comment: social   Social History   Substance and Sexual Activity  Sexual Activity Yes   Partners: Male   Birth control/protection: Injection    Immunization History  Administered Date(s) Administered   Hepatitis A, Adult 01/04/2020   Tdap 03/02/2017, 01/12/2023     Review Of Systems  Constitutional: Denied constitutional symptoms, night sweats, recent illness, fatigue, fever, insomnia and weight loss.  Eyes: Denied eye symptoms, eye pain, photophobia, vision change and visual disturbance.  Ears/Nose/Throat/Neck: Denied ear, nose, throat or neck symptoms, hearing loss, nasal discharge, sinus congestion and sore throat.  Cardiovascular: Denied cardiovascular symptoms, arrhythmia, chest pain/pressure, edema, exercise intolerance, orthopnea and palpitations.  Respiratory: Denied  pulmonary symptoms, asthma, pleuritic pain, productive sputum, cough, dyspnea and wheezing.  Gastrointestinal: Denied, gastro-esophageal reflux, melena, nausea and vomiting.  Genitourinary: Denied genitourinary symptoms including symptomatic vaginal discharge, pelvic relaxation issues, and urinary complaints.  Musculoskeletal: Denied musculoskeletal symptoms, stiffness, swelling, muscle weakness and myalgia.  Dermatologic: Denied dermatology symptoms, rash and scar.  Neurologic: Denied neurology symptoms, dizziness, headache, neck pain and syncope.  Psychiatric: Denied psychiatric symptoms, anxiety and depression.  Endocrine: Denied endocrine symptoms including hot flashes and  night sweats.      Objective:    BP 122/86   Pulse 98   Ht 5\' 3"  (1.6 m)   Wt 203 lb 6.4 oz (92.3 kg)   BMI 36.03 kg/m   Constitutional: Well-developed, well-nourished female in no acute distress Neurological: Alert and oriented to person, place, and time Psychiatric: Mood and affect appropriate Skin: No rashes or lesions Neck: Supple without masses. Trachea is midline.Thyroid is normal size without masses Lymphatics: No cervical, axillary, supraclavicular, or inguinal adenopathy noted Respiratory: Clear to auscultation bilaterally. Good air movement with normal work of breathing. Cardiovascular: Regular rate and rhythm. Extremities grossly normal, nontender with no edema; pulses regular Gastrointestinal: Soft, nontender, nondistended. No masses or hernias appreciated. No hepatosplenomegaly. No fluid wave. No rebound or guarding. Breast Exam: deferred with shared decision making Genitourinary:  deferred     Autumn Messing, [facility]  10/11/23 9:36 AM

## 2023-10-11 NOTE — Assessment & Plan Note (Signed)
-   Reviewed health maintenance topics as documented below. - Most recent pap smear 04/2023, NILM, recommend repeat in one year due to previous history.

## 2023-11-01 NOTE — Progress Notes (Signed)
 GYNECOLOGY PROGRESS NOTE  Subjective:    Patient ID: Emily Hernandez, female    DOB: 02/15/1996, 28 y.o.   MRN: [id number]  HPI  Patient is a 28 y.o. G74P1011 female who presents for consultation for Bilateral Tubal Ligation.  She was referred by Autumn Messing, CNM.  Patient notes that she no longer desires childbearing. Last birth was in August 2024. Has been using Depo Provera for contraception since that time.  Has been previously counseled and declines other forms of contraception at this time.     The following portions of the patient's history were reviewed and updated as appropriate:  She  has a past medical history of Bipolar disorder (HCC), Cold sore, Depression, Dysplastic nevus (10/15/2020), Encounter for planned induction of labor (12/05/2022), Idiopathic intracranial hypertension, Urticaria, and Vaccine for human papilloma virus (HPV) types 6, 11, 16, and 18 administered.  She  has a past surgical history that includes Wisdom tooth extraction; Breast reduction surgery (Bilateral, 01/13/2022); and Tympanostomy tube placement.  Her family history includes Allergic rhinitis in her half-sister, half-sister, and mother; Asthma in her half-sister; Cancer in her paternal grandmother; GI Bleed in her maternal grandfather; Healthy in her father, half-brother, half-sister, half-sister, and mother; Heart attack in her maternal grandmother; Urticaria in her half-sister.  She  reports that she quit smoking about 2 years ago. Her smoking use included cigarettes. She started smoking about 4 years ago. She has a 0.5 pack-year smoking history. She has been exposed to tobacco smoke. She has never used smokeless tobacco. She reports current alcohol use. She reports that she does not use drugs.  Current Outpatient Medications on File Prior to Visit  Medication Sig Dispense Refill   amphetamine-dextroamphetamine (ADDERALL) 20 MG tablet Take 1 tablet (20 mg total) by mouth daily. 30 tablet 0    amphetamine-dextroamphetamine (ADDERALL) 20 MG tablet Take 1 tablet (20 mg total) by mouth daily. 30 tablet 0   buPROPion (WELLBUTRIN XL) 300 MG 24 hr tablet Take 1 tablet (300 mg total) by mouth daily. 30 tablet 2   topiramate (TOPAMAX) 50 MG tablet Take one tablet twice daily. 60 tablet 2   valACYclovir (VALTREX) 1000 MG tablet Take 2 tabs BID x 1 day prn sx 30 tablet 1   No current facility-administered medications on file prior to visit.   She is allergic to penicillins..  Review of Systems Pertinent items noted in HPI and remainder of comprehensive ROS otherwise negative.   Objective:   Blood pressure 109/65, pulse 73, resp. rate 16, height 5\' 3"  (1.6 m), weight 200 lb (90.7 kg), not currently breastfeeding.  Body mass index is 35.43 kg/m. General appearance: alert and no distress Remainder of exam deferred.    Assessment:   1. Sterilization consult      Plan:   Patient desires permanent sterilization.  Other reversible forms of contraception were discussed with patient; she continues to decline all other modalities. Risks of procedure discussed with patient including but not limited to: risk of regret, permanence of method, bleeding, infection, injury to surrounding organs and need for additional procedures.  Failure risk of about 1% with increased risk of ectopic gestation if pregnancy occurs was also discussed with patient.  Also discussed possibility of post-tubal pain syndrome. Patient verbalized understanding of these risks and wants to proceed with sterilization.  Notes that she would like to have surgery towards the end of May. Will f/u in 5-6 weeks for pre-operative appointment.   A total of 15 minutes were  spent face-to-face with the patient during this encounter and over half of that time dealt with counseling and coordination of care.   Hildred Laser, MD [facility]

## 2023-11-09 ENCOUNTER — Encounter: Payer: Self-pay | Admitting: Obstetrics and Gynecology

## 2023-11-09 ENCOUNTER — Ambulatory Visit: Payer: Medicaid Other | Admitting: Obstetrics and Gynecology

## 2023-11-09 VITALS — BP 109/65 | HR 73 | Resp 16 | Ht 63.0 in | Wt 200.0 lb

## 2023-11-09 DIAGNOSIS — Z3009 Encounter for other general counseling and advice on contraception: Secondary | ICD-10-CM | POA: Diagnosis not present

## 2023-12-02 NOTE — Patient Instructions (Signed)

## 2023-12-06 ENCOUNTER — Ambulatory Visit: Payer: Medicaid Other

## 2023-12-06 ENCOUNTER — Other Ambulatory Visit: Payer: Self-pay | Admitting: Adult Health

## 2023-12-06 VITALS — BP 122/72 | HR 80 | Ht 63.0 in | Wt 202.1 lb

## 2023-12-06 DIAGNOSIS — Z3042 Encounter for surveillance of injectable contraceptive: Secondary | ICD-10-CM

## 2023-12-06 DIAGNOSIS — F319 Bipolar disorder, unspecified: Secondary | ICD-10-CM

## 2023-12-06 MED ORDER — MEDROXYPROGESTERONE ACETATE 150 MG/ML IM SUSP
150.0000 mg | Freq: Once | INTRAMUSCULAR | Status: AC
Start: 2023-12-06 — End: 2023-12-06
  Administered 2023-12-06: 150 mg via INTRAMUSCULAR

## 2023-12-06 NOTE — Progress Notes (Signed)
    NURSE VISIT NOTE  Subjective:    Patient ID: Emily Hernandez, female    DOB: 22-Jun-1996, 28 y.o.   MRN: [id number]  HPI  Patient is a 28 y.o. G29P1011 female who presents for depo provera  injection.   Objective:    BP 122/72   Pulse 80   Ht 5\' 3"  (1.6 m)   Wt 202 lb 1.6 oz (91.7 kg)   BMI 35.80 kg/m   Last Annual: 10/11/2023. Last pap: 05/14/2023. Last Depo-Provera : 09/17/2023. Side Effects if any: none. Serum HCG indicated? No . Depo-Provera  150 mg IM given by: Doneen Fuelling, CMA. Site: Left Deltoid  Lab Review   None  Assessment:   1. Encounter for surveillance of injectable contraceptive      Plan:   Next appointment due between July 7 and July 21.    Doneen Fuelling, CMA [facility]

## 2023-12-14 ENCOUNTER — Telehealth (INDEPENDENT_AMBULATORY_CARE_PROVIDER_SITE_OTHER): Admitting: Adult Health

## 2023-12-14 ENCOUNTER — Encounter: Payer: Self-pay | Admitting: Adult Health

## 2023-12-14 DIAGNOSIS — F319 Bipolar disorder, unspecified: Secondary | ICD-10-CM

## 2023-12-14 DIAGNOSIS — G4700 Insomnia, unspecified: Secondary | ICD-10-CM

## 2023-12-14 DIAGNOSIS — F902 Attention-deficit hyperactivity disorder, combined type: Secondary | ICD-10-CM | POA: Diagnosis not present

## 2023-12-14 DIAGNOSIS — F411 Generalized anxiety disorder: Secondary | ICD-10-CM | POA: Diagnosis not present

## 2023-12-14 MED ORDER — TOPIRAMATE 50 MG PO TABS: 50.0000 mg | ORAL_TABLET | Freq: Two times a day (BID) | ORAL | 2 refills | Status: DC

## 2023-12-14 MED ORDER — AMPHETAMINE-DEXTROAMPHETAMINE 20 MG PO TABS: 20.0000 mg | ORAL_TABLET | Freq: Every day | ORAL | 0 refills | Status: DC

## 2023-12-14 MED ORDER — BUPROPION HCL ER (XL) 300 MG PO TB24: 300.0000 mg | ORAL_TABLET | Freq: Every day | ORAL | 2 refills | Status: DC

## 2023-12-14 MED ORDER — REXULTI 0.5 MG PO TABS
0.5000 mg | ORAL_TABLET | Freq: Every day | ORAL | 2 refills | Status: DC
Start: 2023-12-14 — End: 2024-02-22

## 2023-12-14 NOTE — Progress Notes (Signed)
 Emily Hernandez [id number] 1996/03/15 28 y.o.  Virtual Visit via Video Note  I connected with pt @ on 12/14/23 at  8:30 AM EDT by a video enabled telemedicine application and verified that I am speaking with the correct person using two identifiers.   I discussed the limitations of evaluation and management by telemedicine and the availability of in person appointments. The patient expressed understanding and agreed to proceed.  I discussed the assessment and treatment plan with the patient. The patient was provided an opportunity to ask questions and all were answered. The patient agreed with the plan and demonstrated an understanding of the instructions.   The patient was advised to call back or seek an in-person evaluation if the symptoms worsen or if the condition fails to improve as anticipated.  I provided 25 minutes of non-face-to-face time during this encounter.  The patient was located at home.  The provider was located at [facility].   Emily Camera, NP   Subjective:   Patient ID:  Emily Hernandez is a 28 y.o. (DOB 03-29-1996) female.  Chief Complaint: No chief complaint on file.   HPI Emily Hernandez presents for follow-up of ADHD, MDD, GAD, insomnia, and Bipolar disorder.   Describes mood today as "not any better". Pleasant. Flat. Reports decreased tearfulness. Mood symptoms - reports anxiety, depression, and irritability. Reports varying interest and motivation. Denies panic attacks. Reports worry, rumination and over thinking. Denies obsessive thoughts or acts.  Reports feeling overwhelmed. Reports mood as "mostly down". Denies feeling manic. Stating "I don't feel like I'm doing any better". Feels like current medications are not helping to manage mood symptoms. Taking other medications as prescribed. Energy levels lower. Active, has a regular exercise routine.  Enjoys some usual interests and activities. Lives with boyfriend and son. Family local  and supportive.  Appetite adequate. Weight stable. Reports sleeping difficulties - racing thoughts. Averages 4 to 8 hours.  Focus and concentration improved with Adderall 20mg  daily. Completing tasks. Managing aspects of household. Working 3 days a week. Denies SI or HI. Denies AH or VH. Denies self harm. Denies substance use.   Previous medication trials: Latuda, Abilify , Lamictal -hives, Topamax , Vraylar.  Review of Systems:  Review of Systems  Musculoskeletal:  Negative for gait problem.  Neurological:  Negative for tremors.  Psychiatric/Behavioral:         Please refer to HPI    Medications: I have reviewed the patient's current medications.  Current Outpatient Medications  Medication Sig Dispense Refill   Brexpiprazole (REXULTI) 0.5 MG TABS Take 1 tablet (0.5 mg total) by mouth daily. 30 tablet 2   amphetamine -dextroamphetamine  (ADDERALL) 20 MG tablet Take 1 tablet (20 mg total) by mouth daily. 30 tablet 0   [START ON 01/11/2024] amphetamine -dextroamphetamine  (ADDERALL) 20 MG tablet Take 1 tablet (20 mg total) by mouth daily. 30 tablet 0   buPROPion  (WELLBUTRIN  XL) 300 MG 24 hr tablet Take 1 tablet (300 mg total) by mouth daily. 30 tablet 2   topiramate  (TOPAMAX ) 50 MG tablet Take 1 tablet (50 mg total) by mouth 2 (two) times daily. 60 tablet 2   valACYclovir  (VALTREX ) 1000 MG tablet Take 2 tabs BID x 1 day prn sx 30 tablet 1   No current facility-administered medications for this visit.    Medication Side Effects: None  Allergies:  Allergies  Allergen Reactions   Penicillins Hives, Other (See Comments) and Swelling    Swelling and Hives        Hives, throat  swells    Past Medical History:  Diagnosis Date   Bipolar disorder (HCC)    Cold sore    Depression    Dysplastic nevus 10/15/2020   Left upper back, severe. Exc 02/03/21 2:30pm.   Encounter for planned induction of labor 12/05/2022   Idiopathic intracranial hypertension    Urticaria    Vaccine for human  papilloma virus (HPV) types 6, 11, 16, and 18 administered     Family History  Problem Relation Age of Onset   Allergic rhinitis Mother    Healthy Mother    Healthy Father    Heart attack Maternal Grandmother    GI Bleed Maternal Grandfather    Cancer Paternal Grandmother    Healthy Half-Brother    Urticaria Half-Sister    Allergic rhinitis Half-Sister    Healthy Half-Sister    Asthma Half-Sister    Allergic rhinitis Half-Sister    Healthy Half-Sister    Breast cancer Neg Hx    Ovarian cancer Neg Hx    Colon cancer Neg Hx    Diabetes Neg Hx    Angioedema Neg Hx    Eczema Neg Hx     Social History   Socioeconomic History   Marital status: Single    Spouse name: Government social research officer   Number of children: 1   Years of education: 14   Highest education level: Some college, no degree  Occupational History   Occupation: [employer]  Tobacco Use   Smoking status: Former    Current packs/day: 0.00    Average packs/day: 0.3 packs/day for 2.0 years (0.5 ttl pk-yrs)    Types: Cigarettes    Start date: 04/29/2019    Quit date: 04/28/2021    Years since quitting: 2.6    Passive exposure: Past   Smokeless tobacco: Never  Vaping Use   Vaping status: Every Day   Last attempt to quit: 04/14/2021   Substances: Nicotine   Substance and Sexual Activity   Alcohol use: Yes    Comment: social   Drug use: No   Sexual activity: Yes    Partners: Male    Birth control/protection: Injection  Other Topics Concern   Not on file  Social History Narrative   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   Social Drivers of Corporate investment banker Strain: Low Risk  (08/24/2022)   Overall Financial Resource Strain (CARDIA)    Difficulty of Paying Living Expenses: Not very hard  Food Insecurity: No Food Insecurity (03/31/2023)   Hunger Vital Sign    Worried About Running Out of Food in the Last Year: Never true    Ran Out  of Food in the Last Year: Never true  Transportation Needs: No Transportation Needs (03/31/2023)   PRAPARE - Administrator, Civil Service (Medical): No    Lack of Transportation (Non-Medical): No  Physical Activity: Insufficiently Active (08/24/2022)   Exercise Vital Sign    Days of Exercise per Week: 2 days    Minutes of Exercise per Session: 30 min  Stress: No Stress Concern Present (08/24/2022)   [employer] of Occupational Health - Occupational Stress Questionnaire    Feeling of Stress : Not at all  Social Connections: Moderately Isolated (08/24/2022)   Social Connection and Isolation Panel [NHANES]    Frequency of Communication with Friends and Family: More than three times a week    Frequency of Social  Gatherings with Friends and Family: Once a week    Attends Religious Services: Never    Database administrator or Organizations: No    Attends Banker Meetings: Never    Marital Status: Living with partner  Intimate Partner Violence: Not At Risk (03/31/2023)   Humiliation, Afraid, Rape, and Kick questionnaire    Fear of Current or Ex-Partner: No    Emotionally Abused: No    Physically Abused: No    Sexually Abused: No    Past Medical History, Surgical history, Social history, and Family history were reviewed and updated as appropriate.   Please see review of systems for further details on the patient's review from today.   Objective:   Physical Exam:  There were no vitals taken for this visit.  Physical Exam Constitutional:      General: She is not in acute distress. Musculoskeletal:        General: No deformity.  Neurological:     Mental Status: She is alert and oriented to person, place, and time.     Coordination: Coordination normal.  Psychiatric:        Attention and Perception: Attention and perception normal. She does not perceive auditory or visual hallucinations.        Mood and Affect: Mood normal. Mood is not anxious or depressed.  Affect is not labile, blunt, angry or inappropriate.        Speech: Speech normal.        Behavior: Behavior normal.        Thought Content: Thought content normal. Thought content is not paranoid or delusional. Thought content does not include homicidal or suicidal ideation. Thought content does not include homicidal or suicidal plan.        Cognition and Memory: Cognition and memory normal.        Judgment: Judgment normal.     Comments: Insight intact     Lab Review:     Component Value Date/Time   NA 140 09/02/2023 1608   K 4.3 09/02/2023 1608   CL 110 (H) 09/02/2023 1608   CO2 17 (L) 09/02/2023 1608   GLUCOSE 80 09/02/2023 1608   GLUCOSE 96 04/10/2022 1051   BUN 14 09/02/2023 1608   CREATININE 0.80 09/02/2023 1608   CREATININE 0.75 01/30/2014 1309   CALCIUM 10.0 09/02/2023 1608   PROT 7.0 09/02/2023 1608   ALBUMIN 4.7 09/02/2023 1608   AST 14 09/02/2023 1608   ALT 15 09/02/2023 1608   ALKPHOS 65 09/02/2023 1608   BILITOT 0.7 09/02/2023 1608   GFRNONAA >60 04/10/2022 1051   GFRAA >60 05/01/2019 1303       Component Value Date/Time   WBC 13.7 (H) 04/02/2023 0418   RBC 3.16 (L) 04/02/2023 0418   HGB 8.7 (L) 04/02/2023 0418   HGB 11.0 (L) 01/12/2023 0914   HCT 26.8 (L) 04/02/2023 0418   HCT 33.8 (L) 01/12/2023 0914   PLT 186 04/02/2023 0418   PLT 178 01/12/2023 0914   MCV 84.8 04/02/2023 0418   MCV 89 01/12/2023 0914   MCH 27.5 04/02/2023 0418   MCHC 32.5 04/02/2023 0418   RDW 14.2 04/02/2023 0418   RDW 11.9 01/12/2023 0914   LYMPHSABS 1.3 01/12/2023 0914   MONOABS 0.4 09/03/2008 2035   EOSABS 0.1 01/12/2023 0914   BASOSABS 0.0 01/12/2023 0914    No results found for: "POCLITH", "LITHIUM"   No results found for: "PHENYTOIN", "PHENOBARB", "VALPROATE", "CBMZ"   .res Assessment: Plan:  Plan:  Add Rexulti 0.5mg  daily  Adderall 20mg  daily Topamax  100mg  daily Wellbutrin  XL 300mg  every morning   RTC 4 weeks  25 minutes spent dedicated to the care of  this patient on the date of this encounter to include pre-visit review of records, ordering of medication, post visit documentation, and face-to-face time with the patient discussing ADHD, GAD, insomnia and Bipolar 1 disorder. Discussed continuing current medication regimen.  Patient advised to contact office with any questions, adverse effects, or acute worsening in signs and symptoms.  Discussed potential benefits, risks, and side effects of stimulants with patient to include increased heart rate, palpitations, insomnia, increased anxiety, increased irritability, or decreased appetite.  Instructed patient to contact office if experiencing any significant tolerability issues.   Discussed potential metabolic side effects associated with atypical antipsychotics, as well as potential risk for movement side effects. Advised pt to contact office if movement side effects occur.    Diagnoses and all orders for this visit:  Bipolar I disorder (HCC) -     Brexpiprazole (REXULTI) 0.5 MG TABS; Take 1 tablet (0.5 mg total) by mouth daily. -     buPROPion  (WELLBUTRIN  XL) 300 MG 24 hr tablet; Take 1 tablet (300 mg total) by mouth daily. -     topiramate  (TOPAMAX ) 50 MG tablet; Take 1 tablet (50 mg total) by mouth 2 (two) times daily.  Generalized anxiety disorder -     Brexpiprazole (REXULTI) 0.5 MG TABS; Take 1 tablet (0.5 mg total) by mouth daily.  Insomnia, unspecified type  Attention deficit hyperactivity disorder (ADHD), combined type -     amphetamine -dextroamphetamine  (ADDERALL) 20 MG tablet; Take 1 tablet (20 mg total) by mouth daily. -     amphetamine -dextroamphetamine  (ADDERALL) 20 MG tablet; Take 1 tablet (20 mg total) by mouth daily.     Please see After Visit Summary for patient specific instructions.  Future Appointments  Date Time Provider Department Center  12/22/2023  8:35 AM Zenobia Hila, MD [facility] None  02/28/2024  9:15 AM [facility]SE [facility] None    No orders of the defined  types were placed in this encounter.     -------------------------------

## 2023-12-15 ENCOUNTER — Telehealth: Payer: Self-pay

## 2023-12-15 NOTE — Telephone Encounter (Signed)
 Prior Authorization submitted for Rexulti 0.5 mg with [facility] Medicaid of [place]

## 2023-12-16 NOTE — Telephone Encounter (Signed)
 Prior approval received effective 12/01/23-12/14/24 for #30 for 30 day Rexulti  0.5 mg with [facility] ID# [id number].

## 2023-12-22 ENCOUNTER — Ambulatory Visit (INDEPENDENT_AMBULATORY_CARE_PROVIDER_SITE_OTHER): Admitting: Obstetrics and Gynecology

## 2023-12-22 ENCOUNTER — Encounter: Payer: Self-pay | Admitting: Obstetrics and Gynecology

## 2023-12-22 VITALS — BP 125/81 | HR 86 | Ht 63.0 in | Wt 197.9 lb

## 2023-12-22 DIAGNOSIS — Z3009 Encounter for other general counseling and advice on contraception: Secondary | ICD-10-CM

## 2023-12-22 DIAGNOSIS — Z01818 Encounter for other preprocedural examination: Secondary | ICD-10-CM | POA: Diagnosis not present

## 2023-12-22 NOTE — Progress Notes (Signed)
 Patient presents today for a pre-op exam prior to BTL. Medicaid consent signed 10/11/23. She states no additional concerns today.

## 2023-12-22 NOTE — Progress Notes (Signed)
 PRE-OPERATIVE HISTORY AND PHYSICAL EXAM  PCP:  Sinda Duel, PA Subjective:   HPI:  Emily Hernandez is a 28 y.o. G2P1011.  No LMP recorded. Patient has had an injection.  She presents today for a pre-op discussion and PE.  She has the following symptoms: Desires permanent sterilization  Review of Systems:   Constitutional: Denied constitutional symptoms, night sweats, recent illness, fatigue, fever, insomnia and weight loss.  Eyes: Denied eye symptoms, eye pain, photophobia, vision change and visual disturbance.  Ears/Nose/Throat/Neck: Denied ear, nose, throat or neck symptoms, hearing loss, nasal discharge, sinus congestion and sore throat.  Cardiovascular: Denied cardiovascular symptoms, arrhythmia, chest pain/pressure, edema, exercise intolerance, orthopnea and palpitations.  Respiratory: Denied pulmonary symptoms, asthma, pleuritic pain, productive sputum, cough, dyspnea and wheezing.  Gastrointestinal: Denied, gastro-esophageal reflux, melena, nausea and vomiting.  Genitourinary: Denied genitourinary symptoms including symptomatic vaginal discharge, pelvic relaxation issues, and urinary complaints.  Musculoskeletal: Denied musculoskeletal symptoms, stiffness, swelling, muscle weakness and myalgia.  Dermatologic: Denied dermatology symptoms, rash and scar.  Neurologic: Denied neurology symptoms, dizziness, headache, neck pain and syncope.  Psychiatric: Denied psychiatric symptoms, anxiety and depression.  Endocrine: Denied endocrine symptoms including hot flashes and night sweats.   OB History  Gravida Para Term Preterm AB Living  2 1 1  1 1   SAB IAB Ectopic Multiple Live Births   1  0 1    # Outcome Date GA Lbr Len/2nd Weight Sex Type Anes PTL Lv  2 Term 04/01/23 [redacted]w[redacted]d / 01:57  M Vag-Spont EPI  LIV  1 IAB 03/2018     TAB       Obstetric Comments  Pt had elective abortion, date unknown.    Past Medical History:  Diagnosis Date   Bipolar disorder (HCC)    Cold  sore    Depression    Dysplastic nevus 10/15/2020   Left upper back, severe. Exc 02/03/21 2:30pm.   Encounter for planned induction of labor 12/05/2022   Idiopathic intracranial hypertension    Urticaria    Vaccine for human papilloma virus (HPV) types 6, 11, 16, and 18 administered     Past Surgical History:  Procedure Laterality Date   BREAST REDUCTION SURGERY Bilateral 01/13/2022   Procedure: MAMMARY REDUCTION  (BREAST);  Surgeon: Rodman Clam, MD;  Location: [facility] OR;  Service: Plastics;  Laterality: Bilateral;   TYMPANOSTOMY TUBE PLACEMENT     WISDOM TOOTH EXTRACTION        SOCIAL HISTORY:  Social History   Tobacco Use  Smoking Status Former   Current packs/day: 0.00   Average packs/day: 0.3 packs/day for 2.0 years (0.5 ttl pk-yrs)   Types: Cigarettes   Start date: 04/29/2019   Quit date: 04/28/2021   Years since quitting: 2.6   Passive exposure: Past  Smokeless Tobacco Never   Social History   Substance and Sexual Activity  Alcohol Use Yes   Comment: social    Social History   Substance and Sexual Activity  Drug Use No    Family History  Problem Relation Age of Onset   Allergic rhinitis Mother    Healthy Mother    Healthy Father    Heart attack Maternal Grandmother    GI Bleed Maternal Grandfather    Cancer Paternal Grandmother    Healthy Half-Brother    Urticaria Half-Sister    Allergic rhinitis Half-Sister    Healthy Half-Sister    Asthma Half-Sister    Allergic rhinitis Half-Sister    Healthy Half-Sister  Breast cancer Neg Hx    Ovarian cancer Neg Hx    Colon cancer Neg Hx    Diabetes Neg Hx    Angioedema Neg Hx    Eczema Neg Hx     ALLERGIES:  Penicillins  MEDS:   Current Outpatient Medications on File Prior to Visit  Medication Sig Dispense Refill   amphetamine -dextroamphetamine  (ADDERALL) 20 MG tablet Take 1 tablet (20 mg total) by mouth daily. 30 tablet 0   [START ON 01/11/2024] amphetamine -dextroamphetamine  (ADDERALL) 20 MG  tablet Take 1 tablet (20 mg total) by mouth daily. 30 tablet 0   Brexpiprazole  (REXULTI ) 0.5 MG TABS Take 1 tablet (0.5 mg total) by mouth daily. 30 tablet 2   buPROPion  (WELLBUTRIN  XL) 300 MG 24 hr tablet Take 1 tablet (300 mg total) by mouth daily. 30 tablet 2   medroxyPROGESTERone  (DEPO-PROVERA ) 150 MG/ML injection Inject 150 mg into the muscle every 3 (three) months.     topiramate  (TOPAMAX ) 50 MG tablet Take 1 tablet (50 mg total) by mouth 2 (two) times daily. 60 tablet 2   valACYclovir  (VALTREX ) 1000 MG tablet Take 2 tabs BID x 1 day prn sx 30 tablet 1   No current facility-administered medications on file prior to visit.    No orders of the defined types were placed in this encounter.    Physical examination BP 125/81   Pulse 86   Ht 5\' 3"  (1.6 m)   Wt 197 lb 14.4 oz (89.8 kg)   Breastfeeding No   BMI 35.06 kg/m   General NAD, Conversant  HEENT Atraumatic; Op clear with mmm.  Normo-cephalic.  Anicteric sclerae  Thyroid /Neck Smooth without nodularity or enlargement. Normal ROM.  Neck Supple.  Skin No rashes, lesions or ulceration. Normal palpated skin turgor. No nodularity.  Breasts: No masses or discharge.  Symmetric.  No axillary adenopathy.  Lungs: Clear to auscultation.No rales or wheezes. Normal Respiratory effort, no retractions.  Heart: NSR.  No murmurs or rubs appreciated. No peripheral edema  Abdomen: Soft.  Non-tender.  No masses.  No HSM. No hernia  Extremities: Moves all appropriately.  Normal ROM for age. No lymphadenopathy.  Neuro: Oriented to PPT.  Normal mood. Normal affect.     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 Pre-op exam   2. Encounter for consultation for female sterilization    Desires permanent sterilization  No previous abdominal surgery   Plan:   Orders: No orders of the defined types were placed in this encounter.    1.  Filshie clip tubal occlusion  Pre-op discussions regarding Risks and Benefits of her scheduled surgery.  Tubal We have discussed permanent sterilization in detail.  I have reviewed Filshie clip placement as a means of sterilization.  I have explained the risks and benefits of this procedure in detail.  I have stressed the  fact that this is a permanent and not reversible procedure and there is a failure rate of 3 to7 per 1000 which is somewhat timing and method dependent.  I have discussed the possibility of inadvertent damage to bowel, bladder, blood vessels or other internal organs..  I have specifically discussed the risk of anesthesia, infection and blood loss with her.  We have discussed the possibility of laparotomy should there be a problem and the additional possibility of a longer hospital stay.  I have spoken with her regarding the recovery period, informing her that after this procedure, most people are performing their normal daily activities within one week.  I have recommended abstinence or another birth control method for 2-4 weeks following this procedure.  Other options of birth control have also been discussed.  The procedure of sterilization and alternate methods of birth control were discussed in detail with the patient.  I have answered all her questions and I believe that she has an adequate and informed understanding of the procedure.   Delice Felt,  M.D. 12/22/2023 9:21 AM

## 2023-12-22 NOTE — H&P (Signed)
 PRE-OPERATIVE HISTORY AND PHYSICAL EXAM  PCP:  Sinda Duel, PA Subjective:   HPI:  Emily Hernandez is a 28 y.o. G2P1011.  No LMP recorded. Patient has had an injection.  She presents today for a pre-op discussion and PE.  She has the following symptoms: Desires permanent sterilization  Review of Systems:   Constitutional: Denied constitutional symptoms, night sweats, recent illness, fatigue, fever, insomnia and weight loss.  Eyes: Denied eye symptoms, eye pain, photophobia, vision change and visual disturbance.  Ears/Nose/Throat/Neck: Denied ear, nose, throat or neck symptoms, hearing loss, nasal discharge, sinus congestion and sore throat.  Cardiovascular: Denied cardiovascular symptoms, arrhythmia, chest pain/pressure, edema, exercise intolerance, orthopnea and palpitations.  Respiratory: Denied pulmonary symptoms, asthma, pleuritic pain, productive sputum, cough, dyspnea and wheezing.  Gastrointestinal: Denied, gastro-esophageal reflux, melena, nausea and vomiting.  Genitourinary: Denied genitourinary symptoms including symptomatic vaginal discharge, pelvic relaxation issues, and urinary complaints.  Musculoskeletal: Denied musculoskeletal symptoms, stiffness, swelling, muscle weakness and myalgia.  Dermatologic: Denied dermatology symptoms, rash and scar.  Neurologic: Denied neurology symptoms, dizziness, headache, neck pain and syncope.  Psychiatric: Denied psychiatric symptoms, anxiety and depression.  Endocrine: Denied endocrine symptoms including hot flashes and night sweats.   OB History  Gravida Para Term Preterm AB Living  2 1 1  1 1   SAB IAB Ectopic Multiple Live Births   1  0 1    # Outcome Date GA Lbr Len/2nd Weight Sex Type Anes PTL Lv  2 Term 04/01/23 [redacted]w[redacted]d / 01:57  M Vag-Spont EPI  LIV  1 IAB 03/2018     TAB       Obstetric Comments  Pt had elective abortion, date unknown.    Past Medical History:  Diagnosis Date   Bipolar disorder (HCC)    Cold  sore    Depression    Dysplastic nevus 10/15/2020   Left upper back, severe. Exc 02/03/21 2:30pm.   Encounter for planned induction of labor 12/05/2022   Idiopathic intracranial hypertension    Urticaria    Vaccine for human papilloma virus (HPV) types 6, 11, 16, and 18 administered     Past Surgical History:  Procedure Laterality Date   BREAST REDUCTION SURGERY Bilateral 01/13/2022   Procedure: MAMMARY REDUCTION  (BREAST);  Surgeon: Rodman Clam, MD;  Location: [facility] OR;  Service: Plastics;  Laterality: Bilateral;   TYMPANOSTOMY TUBE PLACEMENT     WISDOM TOOTH EXTRACTION        SOCIAL HISTORY:  Social History   Tobacco Use  Smoking Status Former   Current packs/day: 0.00   Average packs/day: 0.3 packs/day for 2.0 years (0.5 ttl pk-yrs)   Types: Cigarettes   Start date: 04/29/2019   Quit date: 04/28/2021   Years since quitting: 2.6   Passive exposure: Past  Smokeless Tobacco Never   Social History   Substance and Sexual Activity  Alcohol Use Yes   Comment: social    Social History   Substance and Sexual Activity  Drug Use No    Family History  Problem Relation Age of Onset   Allergic rhinitis Mother    Healthy Mother    Healthy Father    Heart attack Maternal Grandmother    GI Bleed Maternal Grandfather    Cancer Paternal Grandmother    Healthy Half-Brother    Urticaria Half-Sister    Allergic rhinitis Half-Sister    Healthy Half-Sister    Asthma Half-Sister    Allergic rhinitis Half-Sister    Healthy Half-Sister  Breast cancer Neg Hx    Ovarian cancer Neg Hx    Colon cancer Neg Hx    Diabetes Neg Hx    Angioedema Neg Hx    Eczema Neg Hx     ALLERGIES:  Penicillins  MEDS:   Current Outpatient Medications on File Prior to Visit  Medication Sig Dispense Refill   amphetamine -dextroamphetamine  (ADDERALL) 20 MG tablet Take 1 tablet (20 mg total) by mouth daily. 30 tablet 0   [START ON 01/11/2024] amphetamine -dextroamphetamine  (ADDERALL) 20 MG  tablet Take 1 tablet (20 mg total) by mouth daily. 30 tablet 0   Brexpiprazole  (REXULTI ) 0.5 MG TABS Take 1 tablet (0.5 mg total) by mouth daily. 30 tablet 2   buPROPion  (WELLBUTRIN  XL) 300 MG 24 hr tablet Take 1 tablet (300 mg total) by mouth daily. 30 tablet 2   medroxyPROGESTERone  (DEPO-PROVERA ) 150 MG/ML injection Inject 150 mg into the muscle every 3 (three) months.     topiramate  (TOPAMAX ) 50 MG tablet Take 1 tablet (50 mg total) by mouth 2 (two) times daily. 60 tablet 2   valACYclovir  (VALTREX ) 1000 MG tablet Take 2 tabs BID x 1 day prn sx 30 tablet 1   No current facility-administered medications on file prior to visit.    No orders of the defined types were placed in this encounter.    Physical examination BP 125/81   Pulse 86   Ht 5\' 3"  (1.6 m)   Wt 197 lb 14.4 oz (89.8 kg)   Breastfeeding No   BMI 35.06 kg/m   General NAD, Conversant  HEENT Atraumatic; Op clear with mmm.  Normo-cephalic.  Anicteric sclerae  Thyroid /Neck Smooth without nodularity or enlargement. Normal ROM.  Neck Supple.  Skin No rashes, lesions or ulceration. Normal palpated skin turgor. No nodularity.  Breasts: No masses or discharge.  Symmetric.  No axillary adenopathy.  Lungs: Clear to auscultation.No rales or wheezes. Normal Respiratory effort, no retractions.  Heart: NSR.  No murmurs or rubs appreciated. No peripheral edema  Abdomen: Soft.  Non-tender.  No masses.  No HSM. No hernia  Extremities: Moves all appropriately.  Normal ROM for age. No lymphadenopathy.  Neuro: Oriented to PPT.  Normal mood. Normal affect.     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 Pre-op exam   2. Encounter for consultation for female sterilization    Desires permanent sterilization  No previous abdominal surgery   Plan:   Orders: No orders of the defined types were placed in this encounter.    1.  Filshie clip tubal occlusion

## 2023-12-28 ENCOUNTER — Ambulatory Visit
Admission: EM | Admit: 2023-12-28 | Discharge: 2023-12-28 | Disposition: A | Discharge status: Home / Self Care | Attending: Emergency Medicine | Admitting: Emergency Medicine

## 2023-12-28 DIAGNOSIS — R21 Rash and other nonspecific skin eruption: Secondary | ICD-10-CM | POA: Diagnosis not present

## 2023-12-28 MED ORDER — TRIAMCINOLONE ACETONIDE 0.1 % EX CREA: 1.0000 | TOPICAL_CREAM | Freq: Two times a day (BID) | CUTANEOUS | 0 refills | Status: DC

## 2023-12-28 NOTE — ED Provider Notes (Signed)
 Emily Hernandez    CSN: [id number] Arrival date & time: 12/28/23  0847      History   Chief Complaint Chief Complaint  Patient presents with   Rash    HPI Emily Hernandez is a 28 y.o. female.  Patient presents with 2-week history of rash on her back and extremities.  The rash does not itch or hurt.  She states the rash was pink until she applied a self-tanning cream and now the areas are brown.  No fever, difficulty swallowing, shortness of breath.  No treatments at home.  Patient's medical history includes urticaria; she was seen by [facility] on 09/02/2023.  The history is provided by the patient and medical records.    Past Medical History:  Diagnosis Date   Bipolar disorder (HCC)    Cold sore    Depression    Dysplastic nevus 10/15/2020   Left upper back, severe. Exc 02/03/21 2:30pm.   Encounter for planned induction of labor 12/05/2022   Idiopathic intracranial hypertension    Urticaria    Vaccine for human papilloma virus (HPV) types 6, 11, 16, and 18 administered     Patient Active Problem List   Diagnosis Date Noted   Sterilization consult 10/11/2023   Well woman exam 10/11/2023   Postpartum care following vaginal delivery 04/07/2023   Obesity complicating pregnancy in third trimester 03/31/2023   Pruritus 03/09/2023   ADD (attention deficit disorder) 10/20/2022   Bipolar disorder (HCC) 10/20/2022   History of migraine 10/20/2022   Obesity in pregnancy 09/21/2022   Need for rhogam due to Rh negative mother 09/21/2022   Pregnancy, supervision, high-risk 08/24/2022   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   Hypertrophy of breast    Idiopathic intracranial hypertension 02/20/2014    Past Surgical History:  Procedure Laterality Date   BREAST REDUCTION SURGERY Bilateral 01/13/2022   Procedure: MAMMARY REDUCTION  (BREAST);  Surgeon: Rodman Clam, MD;  Location: [facility] OR;  Service: Plastics;  Laterality: Bilateral;   TYMPANOSTOMY TUBE  PLACEMENT     WISDOM TOOTH EXTRACTION      OB History     Gravida  2   Para  1   Term  1   Preterm      AB  1   Living  1      SAB      IAB  1   Ectopic      Multiple  0   Live Births  1        Obstetric Comments  Pt had elective abortion, date unknown.          Home Medications    Prior to Admission medications   Medication Sig Start Date End Date Taking? Authorizing Provider  triamcinolone cream (KENALOG) 0.1 % Apply 1 Application topically 2 (two) times daily. 12/28/23  Yes Wellington Half, NP  amphetamine -dextroamphetamine  (ADDERALL) 20 MG tablet Take 1 tablet (20 mg total) by mouth daily. 12/14/23   Mozingo, Regina Nattalie, NP  amphetamine -dextroamphetamine  (ADDERALL) 20 MG tablet Take 1 tablet (20 mg total) by mouth daily. 01/11/24   Mozingo, Regina Nattalie, NP  Brexpiprazole  (REXULTI ) 0.5 MG TABS Take 1 tablet (0.5 mg total) by mouth daily. 12/14/23   Mozingo, Regina Nattalie, NP  buPROPion  (WELLBUTRIN  XL) 300 MG 24 hr tablet Take 1 tablet (300 mg total) by mouth daily. 12/14/23   Mozingo, Regina Nattalie, NP  medroxyPROGESTERone  (DEPO-PROVERA ) 150 MG/ML injection Inject 150 mg into the muscle every 3 (  three) months.    [provider]  topiramate  (TOPAMAX ) 50 MG tablet Take 1 tablet (50 mg total) by mouth 2 (two) times daily. 12/14/23   Mozingo, Regina Nattalie, NP  valACYclovir  (VALTREX ) 1000 MG tablet Take 2 tabs BID x 1 day prn sx 10/15/20   Copland, Alicia B, PA-C    Family History Family History  Problem Relation Age of Onset   Allergic rhinitis Mother    Healthy Mother    Healthy Father    Heart attack Maternal Grandmother    GI Bleed Maternal Grandfather    Cancer Paternal Grandmother    Healthy Half-Brother    Urticaria Half-Sister    Allergic rhinitis Half-Sister    Healthy Half-Sister    Asthma Half-Sister    Allergic rhinitis Half-Sister    Healthy Half-Sister    Breast cancer Neg Hx    Ovarian cancer Neg Hx    Colon cancer  Neg Hx    Diabetes Neg Hx    Angioedema Neg Hx    Eczema Neg Hx     Social History Social History   Tobacco Use   Smoking status: Former    Current packs/day: 0.00    Average packs/day: 0.3 packs/day for 2.0 years (0.5 ttl pk-yrs)    Types: Cigarettes    Start date: 04/29/2019    Quit date: 04/28/2021    Years since quitting: 2.6    Passive exposure: Past   Smokeless tobacco: Never  Vaping Use   Vaping status: Every Day   Last attempt to quit: 04/14/2021   Substances: Nicotine   Substance Use Topics   Alcohol use: Yes    Comment: social   Drug use: No     Allergies   Penicillins   Review of Systems Review of Systems  Constitutional:  Negative for chills and fever.  HENT:  Negative for sore throat, trouble swallowing and voice change.   Respiratory:  Negative for cough and shortness of breath.   Skin:  Positive for color change and rash.     Physical Exam Triage Vital Signs ED Triage Vitals  Encounter Vitals Group     BP 12/28/23 0917 112/71     Systolic BP Percentile --      Diastolic BP Percentile --      Pulse Rate 12/28/23 0917 82     Resp 12/28/23 0917 18     Temp 12/28/23 0917 97.7 F (36.5 C)     Temp src --      SpO2 12/28/23 0917 98 %     Weight --      Height --      Head Circumference --      Peak Flow --      Pain Score 12/28/23 0855 0     Pain Loc --      Pain Education --      Exclude from Growth Chart --    No data found.  Updated Vital Signs BP 112/71   Pulse 82   Temp 97.7 F (36.5 C)   Resp 18   SpO2 98%   Visual Acuity Right Eye Distance:   Left Eye Distance:   Bilateral Distance:    Right Eye Near:   Left Eye Near:    Bilateral Near:     Physical Exam Constitutional:      General: She is not in acute distress. HENT:     Mouth/Throat:     Mouth: Mucous membranes are moist.  Cardiovascular:  Rate and Rhythm: Normal rate and regular rhythm.  Pulmonary:     Effort: Pulmonary effort is normal. No respiratory  distress.  Skin:    General: Skin is warm and dry.     Findings: Rash present.     Comments: Nontender flat discrete macular rash on bilateral inner thighs, bilateral arms, back.  No erythema or drainage.  No induration.  No open wounds.  Neurological:     Mental Status: She is alert.      UC Treatments / Results  Labs (all labs ordered are listed, but only abnormal results are displayed) Labs Reviewed - No data to display  EKG   Radiology No results found.  Procedures Procedures (including critical care time)  Medications Ordered in UC Medications - No data to display  Initial Impression / Assessment and Plan / UC Course  I have reviewed the triage vital signs and the nursing notes.  Pertinent labs & imaging results that were available during my care of the patient were reviewed by me and considered in my medical decision making (see chart for details).    Rash.  Afebrile and vital signs are stable.  No difficulty swallowing or breathing.  The rash is nontender and nonpruritic.  Treating with triamcinolone cream.  Instructed patient to follow-up with her PCP or dermatologist if her symptoms are not improving.  She agrees to plan of care.  Final Clinical Impressions(s) / UC Diagnoses   Final diagnoses:  Rash     Discharge Instructions      Use the triamcinolone cream as directed.  Follow-up with your primary care provider or dermatologist if your symptoms are not improving.   ED Prescriptions     Medication Sig Dispense Auth. Provider   triamcinolone cream (KENALOG) 0.1 % Apply 1 Application topically 2 (two) times daily. 30 g Wellington Half, NP      PDMP not reviewed this encounter.   Wellington Half, NP 12/28/23 [phone]

## 2023-12-28 NOTE — ED Triage Notes (Signed)
 Patient to Urgent Care with complaints of a rash present to her back/ chest/ arms/ inner thighs.  Reports she finds a new area on her body every day. Initially the areas were red (now darker d/t using self tanner). Denies any pain/ itching.  Symptoms x2 weeks.

## 2023-12-28 NOTE — Discharge Instructions (Addendum)
Use the triamcinolone cream as directed.    Follow up with your primary care provider or dermatologist if your symptoms are not improving.     

## 2024-01-05 ENCOUNTER — Telehealth: Payer: Self-pay | Admitting: Obstetrics and Gynecology

## 2024-01-05 NOTE — Telephone Encounter (Signed)
 Patient above needs to cancel her surgery. I informed her that she will need to contact the hospital to cancel the Pre admission. She wants to reschedule at a later date. Will you please reach back out to her. I have cancelled her 01/25/2024 Post OP appointment with Dr. Luster Salters.

## 2024-01-12 ENCOUNTER — Other Ambulatory Visit

## 2024-01-17 ENCOUNTER — Ambulatory Visit: Admit: 2024-01-17 | Admitting: Obstetrics and Gynecology

## 2024-01-17 SURGERY — OCCLUSION, FALLOPIAN TUBES, LAPAROSCOPIC, USING DEVICE
Anesthesia: Choice

## 2024-01-25 ENCOUNTER — Encounter: Admitting: Obstetrics and Gynecology

## 2024-02-22 ENCOUNTER — Encounter: Payer: Self-pay | Admitting: Adult Health

## 2024-02-22 ENCOUNTER — Telehealth: Admitting: Adult Health

## 2024-02-22 VITALS — Ht 63.0 in | Wt 205.0 lb

## 2024-02-22 DIAGNOSIS — F319 Bipolar disorder, unspecified: Secondary | ICD-10-CM | POA: Diagnosis not present

## 2024-02-22 DIAGNOSIS — F902 Attention-deficit hyperactivity disorder, combined type: Secondary | ICD-10-CM

## 2024-02-22 DIAGNOSIS — G4700 Insomnia, unspecified: Secondary | ICD-10-CM | POA: Diagnosis not present

## 2024-02-22 DIAGNOSIS — F411 Generalized anxiety disorder: Secondary | ICD-10-CM | POA: Diagnosis not present

## 2024-02-22 MED ORDER — AMPHETAMINE-DEXTROAMPHETAMINE 20 MG PO TABS: 20.0000 mg | ORAL_TABLET | Freq: Every day | ORAL | 0 refills | Status: AC

## 2024-02-22 MED ORDER — BUPROPION HCL ER (XL) 300 MG PO TB24
300.0000 mg | ORAL_TABLET | Freq: Every day | ORAL | 2 refills | Status: AC
Start: 2024-02-22 — End: ?

## 2024-02-22 MED ORDER — CARIPRAZINE HCL 1.5 MG PO CAPS: 1.5000 mg | ORAL_CAPSULE | Freq: Every day | ORAL | 5 refills | Status: AC

## 2024-02-22 MED ORDER — TOPIRAMATE 50 MG PO TABS: 50.0000 mg | ORAL_TABLET | Freq: Two times a day (BID) | ORAL | 2 refills | Status: AC

## 2024-02-22 NOTE — Progress Notes (Signed)
 Emily Hernandez [id number] 03/05/1996 28 y.o.  Virtual Visit via Video Note  I connected with pt @ on 02/22/24 at  8:00 AM EDT by a video enabled telemedicine application and verified that I am speaking with the correct person using two identifiers.   I discussed the limitations of evaluation and management by telemedicine and the availability of in person appointments. The patient expressed understanding and agreed to proceed.  I discussed the assessment and treatment plan with the patient. The patient was provided an opportunity to ask questions and all were answered. The patient agreed with the plan and demonstrated an understanding of the instructions.   The patient was advised to call back or seek an in-person evaluation if the symptoms worsen or if the condition fails to improve as anticipated.  I provided 25 minutes of non-face-to-face time during this encounter.  The patient was located at home.  The provider was located at [facility].   [name], NP   Subjective:   Patient ID:  Emily Hernandez is a 28 y.o. (DOB Aug 22, 1995) female.  Chief Complaint: No chief complaint on file.   HPI Jackson LOISE Harlem presents for follow-up of ADHD, MDD, GAD, insomnia, and Bipolar disorder.   Describes mood today as not any better. Pleasant. Flat. Reports decreased tearfulness. Mood symptoms - reports anxiety, depression, and irritability. Reports lower interest and motivation. Denies panic attacks. Reports worry, rumination and over thinking. Reports obsessive thoughts and acts.  Reports feeling overwhelmed. Reports mood is lower. Denies feeling manic. Stating I don't feel any better. Feels like current medications are not helping to manage mood symptoms. Taking other medications as prescribed. Energy levels lower. Active, has a regular exercise routine - walking 15,000 steps daily.  Enjoys some usual interests and activities. Lives with boyfriend and 29 month old  son. Family local and supportive.  Appetite increased - can't stop eating. Reports weight gain. Reports sleeping better at night. Averages 6 to 8 hours.  Focus and concentration improved with Adderall 20mg  daily. Completing tasks. Managing aspects of household. Working 4 to 5 days a week. Denies SI or HI. Denies AH or VH. Denies self harm. Denies substance use.   Previous medication trials: Latuda, Abilify , Lamictal -hives, Topamax , Vraylar .  Review of Systems:  Review of Systems  Musculoskeletal:  Negative for gait problem.  Neurological:  Negative for tremors.  Psychiatric/Behavioral:         Please refer to HPI    Medications: I have reviewed the patient's current medications.  Current Outpatient Medications  Medication Sig Dispense Refill   amphetamine -dextroamphetamine  (ADDERALL) 20 MG tablet Take 1 tablet (20 mg total) by mouth daily. 30 tablet 0   amphetamine -dextroamphetamine  (ADDERALL) 20 MG tablet Take 1 tablet (20 mg total) by mouth daily. 30 tablet 0   Brexpiprazole  (REXULTI ) 0.5 MG TABS Take 1 tablet (0.5 mg total) by mouth daily. 30 tablet 2   buPROPion  (WELLBUTRIN  XL) 300 MG 24 hr tablet Take 1 tablet (300 mg total) by mouth daily. 30 tablet 2   medroxyPROGESTERone  (DEPO-PROVERA ) 150 MG/ML injection Inject 150 mg into the muscle every 3 (three) months.     topiramate  (TOPAMAX ) 50 MG tablet Take 1 tablet (50 mg total) by mouth 2 (two) times daily. 60 tablet 2   triamcinolone  cream (KENALOG ) 0.1 % Apply 1 Application topically 2 (two) times daily. 30 g 0   valACYclovir  (VALTREX ) 1000 MG tablet Take 2 tabs BID x 1 day prn sx 30 tablet 1   No  current facility-administered medications for this visit.    Medication Side Effects: None  Allergies:  Allergies  Allergen Reactions   Penicillins Hives, Other (See Comments) and Swelling    Swelling and Hives        Hives, throat swells    Past Medical History:  Diagnosis Date   Bipolar disorder (HCC)    Cold sore     Depression    Dysplastic nevus 10/15/2020   Left upper back, severe. Exc 02/03/21 2:30pm.   Encounter for planned induction of labor 12/05/2022   Idiopathic intracranial hypertension    Urticaria    Vaccine for human papilloma virus (HPV) types 6, 11, 16, and 18 administered     Family History  Problem Relation Age of Onset   Allergic rhinitis Mother    Healthy Mother    Healthy Father    Heart attack Maternal Grandmother    GI Bleed Maternal Grandfather    Cancer Paternal Grandmother    Healthy Half-Brother    Urticaria Half-Sister    Allergic rhinitis Half-Sister    Healthy Half-Sister    Asthma Half-Sister    Allergic rhinitis Half-Sister    Healthy Half-Sister    Breast cancer Neg Hx    Ovarian cancer Neg Hx    Colon cancer Neg Hx    Diabetes Neg Hx    Angioedema Neg Hx    Eczema Neg Hx     Social History   Socioeconomic History   Marital status: Single    Spouse name: Government social research officer   Number of children: 1   Years of education: 14   Highest education level: Some college, no degree  Occupational History   Occupation: [employer]  Tobacco Use   Smoking status: Former    Current packs/day: 0.00    Average packs/day: 0.3 packs/day for 2.0 years (0.5 ttl pk-yrs)    Types: Cigarettes    Start date: 04/29/2019    Quit date: 04/28/2021    Years since quitting: 2.8    Passive exposure: Past   Smokeless tobacco: Never  Vaping Use   Vaping status: Every Day   Last attempt to quit: 04/14/2021   Substances: Nicotine   Substance and Sexual Activity   Alcohol use: Yes    Comment: social   Drug use: No   Sexual activity: Yes    Partners: Male    Birth control/protection: Injection  Other Topics Concern   Not on file  Social History Narrative   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   Social Drivers of Corporate investment banker Strain: Low Risk  (08/24/2022)   Overall Financial  Resource Strain (CARDIA)    Difficulty of Paying Living Expenses: Not very hard  Food Insecurity: No Food Insecurity (03/31/2023)   Hunger Vital Sign    Worried About Running Out of Food in the Last Year: Never true    Ran Out of Food in the Last Year: Never true  Transportation Needs: No Transportation Needs (03/31/2023)   PRAPARE - Administrator, Civil Service (Medical): No    Lack of Transportation (Non-Medical): No  Physical Activity: Insufficiently Active (08/24/2022)   Exercise Vital Sign    Days of Exercise per Week: 2 days    Minutes of Exercise per Session: 30 min  Stress: No Stress Concern Present (08/24/2022)   [employer] of Occupational Health - Occupational Stress Questionnaire  Feeling of Stress : Not at all  Social Connections: Moderately Isolated (08/24/2022)   Social Connection and Isolation Panel    Frequency of Communication with Friends and Family: More than three times a week    Frequency of Social Gatherings with Friends and Family: Once a week    Attends Religious Services: Never    Database administrator or Organizations: No    Attends Banker Meetings: Never    Marital Status: Living with partner  Intimate Partner Violence: Not At Risk (03/31/2023)   Humiliation, Afraid, Rape, and Kick questionnaire    Fear of Current or Ex-Partner: No    Emotionally Abused: No    Physically Abused: No    Sexually Abused: No    Past Medical History, Surgical history, Social history, and Family history were reviewed and updated as appropriate.   Please see review of systems for further details on the patient's review from today.   Objective:   Physical Exam:  There were no vitals taken for this visit.  Physical Exam Constitutional:      General: She is not in acute distress. Musculoskeletal:        General: No deformity.  Neurological:     Mental Status: She is alert and oriented to person, place, and time.     Coordination:  Coordination normal.  Psychiatric:        Attention and Perception: Attention and perception normal. She does not perceive auditory or visual hallucinations.        Mood and Affect: Mood is anxious and depressed. Affect is not labile, blunt, angry or inappropriate.        Speech: Speech normal.        Behavior: Behavior normal.        Thought Content: Thought content normal. Thought content is not paranoid or delusional. Thought content does not include homicidal or suicidal ideation. Thought content does not include homicidal or suicidal plan.        Cognition and Memory: Cognition and memory normal.        Judgment: Judgment normal.     Comments: Insight intact     Lab Review:     Component Value Date/Time   NA 140 09/02/2023 1608   K 4.3 09/02/2023 1608   CL 110 (H) 09/02/2023 1608   CO2 17 (L) 09/02/2023 1608   GLUCOSE 80 09/02/2023 1608   GLUCOSE 96 04/10/2022 1051   BUN 14 09/02/2023 1608   CREATININE 0.80 09/02/2023 1608   CREATININE 0.75 01/30/2014 1309   CALCIUM 10.0 09/02/2023 1608   PROT 7.0 09/02/2023 1608   ALBUMIN 4.7 09/02/2023 1608   AST 14 09/02/2023 1608   ALT 15 09/02/2023 1608   ALKPHOS 65 09/02/2023 1608   BILITOT 0.7 09/02/2023 1608   GFRNONAA >60 04/10/2022 1051   GFRAA >60 05/01/2019 1303       Component Value Date/Time   WBC 13.7 (H) 04/02/2023 0418   RBC 3.16 (L) 04/02/2023 0418   HGB 8.7 (L) 04/02/2023 0418   HGB 11.0 (L) 01/12/2023 0914   HCT 26.8 (L) 04/02/2023 0418   HCT 33.8 (L) 01/12/2023 0914   PLT 186 04/02/2023 0418   PLT 178 01/12/2023 0914   MCV 84.8 04/02/2023 0418   MCV 89 01/12/2023 0914   MCH 27.5 04/02/2023 0418   MCHC 32.5 04/02/2023 0418   RDW 14.2 04/02/2023 0418   RDW 11.9 01/12/2023 0914   LYMPHSABS 1.3 01/12/2023 0914   MONOABS 0.4 09/03/2008 2035  EOSABS 0.1 01/12/2023 0914   BASOSABS 0.0 01/12/2023 0914    No results found for: POCLITH, LITHIUM   No results found for: PHENYTOIN, PHENOBARB,  VALPROATE, CBMZ   .res Assessment: Plan:    Plan:  Add Vraylar  1.5mg  daily  D/C Rexulti  0.5mg  daily  Adderall 20mg  daily Topamax  100mg  daily Wellbutrin  XL 300mg  every morning   Plans to meet with PCP about weight loss  RTC 4 to 6 weeks  25 minutes spent dedicated to the care of this patient on the date of this encounter to include pre-visit review of records, ordering of medication, post visit documentation, and face-to-face time with the patient discussing ADHD, GAD, insomnia and Bipolar 1 disorder. Discussed continuing current medication regimen.  Patient advised to contact office with any questions, adverse effects, or acute worsening in signs and symptoms.  Discussed potential benefits, risks, and side effects of stimulants with patient to include increased heart rate, palpitations, insomnia, increased anxiety, increased irritability, or decreased appetite.  Instructed patient to contact office if experiencing any significant tolerability issues.   Discussed potential metabolic side effects associated with atypical antipsychotics, as well as potential risk for movement side effects. Advised pt to contact office if movement side effects occur.    There are no diagnoses linked to this encounter.   Please see After Visit Summary for patient specific instructions.  Future Appointments  Date Time Provider Department Center  02/28/2024  9:15 AM [facility] None    No orders of the defined types were placed in this encounter.     -------------------------------

## 2024-02-28 ENCOUNTER — Ambulatory Visit (INDEPENDENT_AMBULATORY_CARE_PROVIDER_SITE_OTHER)

## 2024-02-28 VITALS — BP 113/72 | HR 85 | Ht 63.0 in | Wt 202.0 lb

## 2024-02-28 DIAGNOSIS — Z3042 Encounter for surveillance of injectable contraceptive: Secondary | ICD-10-CM | POA: Diagnosis not present

## 2024-02-28 MED ORDER — MEDROXYPROGESTERONE ACETATE 150 MG/ML IM SUSP
150.0000 mg | Freq: Once | INTRAMUSCULAR | Status: AC
  Administered 2024-02-28: 150 mg via INTRAMUSCULAR

## 2024-02-28 NOTE — Progress Notes (Signed)
    NURSE VISIT NOTE  Subjective:    Patient ID: Jackson LOISE Harlem, female    DOB: 1995-10-04, 28 y.o.   MRN: [id number]  HPI  Patient is a 28 y.o. G69P1011 female who presents for depo provera  injection.   Objective:    BP 113/72   Pulse 85   Ht 5' 3 (1.6 m)   Wt 202 lb (91.6 kg)   Breastfeeding No   BMI 35.78 kg/m   Last Annual: 10/11/23. Last pap: 05/14/23. Last Depo-Provera : 12/06/23. Side Effects if any: n/a. Serum HCG indicated? No . Depo-Provera  150 mg IM given by: Waddell Maxim, CMA. Site: Right Deltoid    Assessment:   1. Encounter for Depo-Provera  contraception   2. Surveillance for Depo-Provera  contraception      Plan:   Next appointment due between 05/15/24 and 05/29/24.    Waddell JONELLE Maxim, CMA

## 2024-02-28 NOTE — Patient Instructions (Signed)
 Birth Control Shot: What to Expect A birth control shot prevents pregnancy. A birth control shot is put in (injected) into the skin or a muscle. The shot contains the hormone progestin. Hormones are chemicals that affect how the body works. Progestin prevents pregnancy because it: Stops the ovaries from releasing eggs. Makes cervical mucus thicker. This prevents sperm from getting into the cervix. The cervix is the lowest part of the uterus. Thins the lining of the uterus to prevent a fertilized egg from attaching to the uterus. Tell a health care provider about: Any allergies you have. All medicines you take. These include vitamins, herbs, eye drops, and creams. Any bleeding problems you have. Any medical problems you have. Whether you're pregnant or may be pregnant. What are the risks? Your health care provider will talk with you about risks. These may include: Mood changes or depression. Your bones becoming thinner or weaker. This is called loss of bone density. This can happen if you get the shots for a long period of time. This can cause your bones to break. Blood clots. These are rare. A higher risk of an egg being fertilized outside your uterus, called an ectopic pregnancy.This is rare. What happens before? Your provider may do a physical exam. You may have a test to make sure you aren't pregnant. What happens during a birth control shot?  The area where the shot will be given will be cleaned. A needle will be put into a muscle in your upper arm or butt, or into the skin of your thigh or belly. The needle will be put on a syringe with the medicine in it. The medicine will be pushed through the syringe into your body. A small bandage may be put over the place where the shot was given. What happens after? After the shot, it's common to have: Soreness around the place where the shot was given for a couple of days. Spotting or bleeding between periods. Weight gain. Tender  breasts. Headaches. Belly pain. Ask your provider if you need to use an added method of birth control, such as a condom, sponge, or spermicide. If the first shot is given 1-7 days after the start of your last period, you won't need to use an added method of birth control. If the first shot is given at any other time during your menstrual cycle, you'll need to use an added method of birth control for 7 days after you get the shot. Follow these instructions at home: General instructions Take your medicines only as told. Do not rub or massage the place where the shot was given. Track your periods. This will help you know if they become irregular. Always use a condom to protect against sexually transmitted infections (STIs). Make an appointment in time for your next shot and mark it on your calendar. You must get a shot every 3 months (12-13 weeks) to prevent pregnancy. Lifestyle Do not smoke, vape, or use nicotine or tobacco. Eat foods that are high in calcium and vitamin D, such as milk, cheese, and salmon. Calcium helps keep your bones strong and may help with any loss in bone density caused by the birth control shot. Ask your provider if you should take supplements. Contact a health care provider if you: Have discharge or bleeding from your vagina that isn't normal. Miss a period or think you might be pregnant. Have mood changes or depression. Feel dizzy or light-headed. Have leg pain. Get help right away if you: Have chest pain  or cough up blood. Have trouble breathing. Have a really bad headache that doesn't go away. Have numbness in any part of your body. Have really bad pain in your belly. Have slurred speech or vision problems. These symptoms may be an emergency. Call 911 right away. Do not wait to see if the symptoms will go away. Do not drive yourself to the hospital. This information is not intended to replace advice given to you by your health care provider. Make sure you  discuss any questions you have with your health care provider. Document Revised: 04/08/2023 Document Reviewed: 04/08/2023 Elsevier Patient Education  2024 [employer].

## 2024-03-22 ENCOUNTER — Encounter: Payer: Self-pay | Admitting: Adult Health

## 2024-03-22 ENCOUNTER — Telehealth: Admitting: Adult Health

## 2024-03-22 DIAGNOSIS — F319 Bipolar disorder, unspecified: Secondary | ICD-10-CM

## 2024-03-22 DIAGNOSIS — F902 Attention-deficit hyperactivity disorder, combined type: Secondary | ICD-10-CM | POA: Diagnosis not present

## 2024-03-22 DIAGNOSIS — G4700 Insomnia, unspecified: Secondary | ICD-10-CM

## 2024-03-22 DIAGNOSIS — F411 Generalized anxiety disorder: Secondary | ICD-10-CM | POA: Diagnosis not present

## 2024-03-22 MED ORDER — BUSPIRONE HCL 10 MG PO TABS: 10.0000 mg | ORAL_TABLET | Freq: Three times a day (TID) | ORAL | 2 refills | Status: AC

## 2024-03-22 NOTE — Progress Notes (Signed)
 Emily Hernandez [id number] 1996/07/06 28 y.o.  Virtual Visit via Video Note  I connected with pt @ on 03/22/24 at  8:00 AM EDT by a video enabled telemedicine application and verified that I am speaking with the correct person using two identifiers.   I discussed the limitations of evaluation and management by telemedicine and the availability of in person appointments. The patient expressed understanding and agreed to proceed.  I discussed the assessment and treatment plan with the patient. The patient was provided an opportunity to ask questions and all were answered. The patient agreed with the plan and demonstrated an understanding of the instructions.   The patient was advised to call back or seek an in-person evaluation if the symptoms worsen or if the condition fails to improve as anticipated.  I provided 25 minutes of non-face-to-face time during this encounter.  The patient was located at home.  The provider was located at [facility].   Emily LOISE Sayers, Emily Hernandez   Subjective:   Patient ID:  Emily Hernandez is a 28 y.o. (DOB [birth year]) female.  Chief Complaint: No chief complaint on file.   HPI Emily Hernandez presents for follow-up of ADHD, GAD, insomnia, and Bipolar disorder.  Describes mood today a little bit better in some ways. Pleasant. Flat. Reports decreased tearfulness. Mood symptoms - reports anxiety, depression and irritability. Stating I'm really struggling with my patience -  it's really thin - especially with partner. Reports working more - being called rude a few times while at work. Reports lower interest and motivation. Denies panic attacks. Reports worry, rumination and over thinking. Reports obsessive thoughts, denies acts. Reports feeling very overwhelmed. Denies manic symptoms. Reports mood is lower. Stating I feel like the Vraylar  has helped some. Reports she would like to consider other options. Taking other medications as  prescribed. Energy levels lower. Active, has a regular exercise routine - walking 20,000 steps daily.  Enjoys some usual interests and activities. Lives with boyfriend and 21 month old son. Family local and supportive.  Appetite adequate - overeating - stress eating - binge eating. Reports weight gain. Has an appointment with PCP to discuss weight loss medication. Reports sleeping better some nights than others. Averages 6 to 8 hours.  Focus and concentration improved with Adderall 20mg  daily. Completing tasks. Managing aspects of household. 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 Denies AH or VH. Denies self harm. Denies substance use.   Previous medication trials: Latuda, Abilify , Lamictal -hives, Topamax , Vraylar .    Review of Systems:  Review of Systems  Musculoskeletal:  Negative for gait problem.  Neurological:  Negative for tremors.  Psychiatric/Behavioral:         Please refer to HPI    Medications: I have reviewed the patient's current medications.  Current Outpatient Medications  Medication Sig Dispense Refill   busPIRone  (BUSPAR ) 10 MG tablet Take 1 tablet (10 mg total) by mouth 3 (three) times daily. 90 tablet 2   amphetamine -dextroamphetamine  (ADDERALL) 20 MG tablet Take 1 tablet (20 mg total) by mouth daily. 30 tablet 0   amphetamine -dextroamphetamine  (ADDERALL) 20 MG tablet Take 1 tablet (20 mg total) by mouth daily. 30 tablet 0   buPROPion  (WELLBUTRIN  XL) 300 MG 24 hr tablet Take 1 tablet (300 mg total) by mouth daily. 30 tablet 2   cariprazine  (VRAYLAR ) 1.5 MG capsule Take 1 capsule (1.5 mg total) by mouth daily. 30 capsule 5   medroxyPROGESTERone  (DEPO-PROVERA ) 150 MG/ML injection Inject 150  mg into the muscle every 3 (three) months.     topiramate  (TOPAMAX ) 50 MG tablet Take 1 tablet (50 mg total) by mouth 2 (two) times daily. 60 tablet 2   triamcinolone  cream (KENALOG ) 0.1 % Apply 1 Application topically 2 (two) times daily. 30 g 0   valACYclovir   (VALTREX ) 1000 MG tablet Take 2 tabs BID x 1 day prn sx 30 tablet 1   No current facility-administered medications for this visit.    Medication Side Effects: None  Allergies:  Allergies  Allergen Reactions   Penicillins Hives, Other (See Comments) and Swelling    Swelling and Hives        Hives, throat swells    Past Medical History:  Diagnosis Date   Bipolar disorder (HCC)    Cold sore    Depression    Dysplastic nevus 10/15/2020   Left upper back, severe. Exc 02/03/21 2:30pm.   Encounter for planned induction of labor 12/05/2022   Idiopathic intracranial hypertension    Urticaria    Vaccine for human papilloma virus (HPV) types 6, 11, 16, and 18 administered     Family History  Problem Relation Age of Onset   Allergic rhinitis Mother    Healthy Mother    Healthy Father    Heart attack Maternal Grandmother    GI Bleed Maternal Grandfather    Cancer Paternal Grandmother    Healthy Half-Brother    Urticaria Half-Sister    Allergic rhinitis Half-Sister    Healthy Half-Sister    Asthma Half-Sister    Allergic rhinitis Half-Sister    Healthy Half-Sister    Breast cancer Neg Hx    Ovarian cancer Neg Hx    Colon cancer Neg Hx    Diabetes Neg Hx    Angioedema Neg Hx    Eczema Neg Hx     Social History   Socioeconomic History   Marital status: Single    Spouse name: Government social research officer   Number of children: 1   Years of education: 14   Highest education level: Some college, no degree  Occupational History   Occupation: [employer]  Tobacco Use   Smoking status: Former    Current packs/day: 0.00    Average packs/day: 0.3 packs/day for 2.0 years (0.5 ttl pk-yrs)    Types: Cigarettes    Start date: 04/29/2019    Quit date: 04/28/2021    Years since quitting: 2.9    Passive exposure: Past   Smokeless tobacco: Never  Vaping Use   Vaping status: Every Day   Last attempt to quit: 04/14/2021   Substances: Nicotine   Substance and Sexual Activity   Alcohol use: Yes     Comment: social   Drug use: No   Sexual activity: Yes    Partners: Male    Birth control/protection: Injection  Other Topics Concern   Not on file  Social History Narrative   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   Social Drivers of Corporate investment banker Strain: Low Risk  (08/24/2022)   Overall Financial Resource Strain (CARDIA)    Difficulty of Paying Living Expenses: Not very hard  Food Insecurity: No Food Insecurity (03/31/2023)   Hunger Vital Sign    Worried About Running Out of Food in the Last Year: Never true    Ran Out of Food in the Last Year: Never true  Transportation Needs: No Transportation Needs (03/31/2023)  PRAPARE - Administrator, Civil Service (Medical): No    Lack of Transportation (Non-Medical): No  Physical Activity: Insufficiently Active (08/24/2022)   Exercise Vital Sign    Days of Exercise per Week: 2 days    Minutes of Exercise per Session: 30 min  Stress: No Stress Concern Present (08/24/2022)   [employer] of Occupational Health - Occupational Stress Questionnaire    Feeling of Stress : Not at all  Social Connections: Moderately Isolated (08/24/2022)   Social Connection and Isolation Panel    Frequency of Communication with Friends and Family: More than three times a week    Frequency of Social Gatherings with Friends and Family: Once a week    Attends Religious Services: Never    Database administrator or Organizations: No    Attends Banker Meetings: Never    Marital Status: Living with partner  Intimate Partner Violence: Not At Risk (03/31/2023)   Humiliation, Afraid, Rape, and Kick questionnaire    Fear of Current or Ex-Partner: No    Emotionally Abused: No    Physically Abused: No    Sexually Abused: No    Past Medical History, Surgical history, Social history, and Family history were reviewed and updated as appropriate.    Please see review of systems for further details on the patient's review from today.   Objective:   Physical Exam:  There were no vitals taken for this visit.  Physical Exam Constitutional:      General: She is not in acute distress. Musculoskeletal:        General: No deformity.  Neurological:     Mental Status: She is alert and oriented to person, place, and time.     Coordination: Coordination normal.  Psychiatric:        Attention and Perception: Attention and perception normal. She does not perceive auditory or visual hallucinations.        Mood and Affect: Mood normal. Mood is not anxious or depressed. Affect is not labile, blunt, angry or inappropriate.        Speech: Speech normal.        Behavior: Behavior normal.        Thought Content: Thought content normal. Thought content is not paranoid or delusional. Thought content does not include homicidal or suicidal ideation. Thought content does not include homicidal or suicidal plan.        Cognition and Memory: Cognition and memory normal.        Judgment: Judgment normal.     Comments: Insight intact     Lab Review:     Component Value Date/Time   NA 140 09/02/2023 1608   K 4.3 09/02/2023 1608   CL 110 (H) 09/02/2023 1608   CO2 17 (L) 09/02/2023 1608   GLUCOSE 80 09/02/2023 1608   GLUCOSE 96 04/10/2022 1051   BUN 14 09/02/2023 1608   CREATININE 0.80 09/02/2023 1608   CREATININE 0.75 01/30/2014 1309   CALCIUM 10.0 09/02/2023 1608   PROT 7.0 09/02/2023 1608   ALBUMIN 4.7 09/02/2023 1608   AST 14 09/02/2023 1608   ALT 15 09/02/2023 1608   ALKPHOS 65 09/02/2023 1608   BILITOT 0.7 09/02/2023 1608   GFRNONAA >60 04/10/2022 1051   GFRAA >60 05/01/2019 1303       Component Value Date/Time   WBC 13.7 (H) 04/02/2023 0418   RBC 3.16 (L) 04/02/2023 0418   HGB 8.7 (L) 04/02/2023 0418   HGB 11.0 (L) 01/12/2023 0914  HCT 26.8 (L) 04/02/2023 0418   HCT 33.8 (L) 01/12/2023 0914   PLT 186 04/02/2023 0418   PLT  178 01/12/2023 0914   MCV 84.8 04/02/2023 0418   MCV 89 01/12/2023 0914   MCH 27.5 04/02/2023 0418   MCHC 32.5 04/02/2023 0418   RDW 14.2 04/02/2023 0418   RDW 11.9 01/12/2023 0914   LYMPHSABS 1.3 01/12/2023 0914   MONOABS 0.4 09/03/2008 2035   EOSABS 0.1 01/12/2023 0914   BASOSABS 0.0 01/12/2023 0914    No results found for: POCLITH, LITHIUM   No results found for: PHENYTOIN, PHENOBARB, VALPROATE, CBMZ   .res Assessment: Plan:    Plan:  Add Buspar  10mg  TID for anxiety and irritability.  Vraylar  1.5mg  daily Adderall 20mg  daily Topamax  100mg  daily Wellbutrin  XL 300mg  every morning   Plans to meet with PCP about weight loss  RTC 3/4 weeks  25 minutes spent dedicated to the care of this patient on the date of this encounter to include pre-visit review of records, ordering of medication, post visit documentation, and face-to-face time with the patient discussing ADHD, GAD, insomnia and Bipolar 1 disorder. Discussed continuing current medication regimen.  Patient advised to contact office with any questions, adverse effects, or acute worsening in signs and symptoms.  Discussed potential benefits, risks, and side effects of stimulants with patient to include increased heart rate, palpitations, insomnia, increased anxiety, increased irritability, or decreased appetite.  Instructed patient to contact office if experiencing any significant tolerability issues.   Discussed potential metabolic side effects associated with atypical antipsychotics, as well as potential risk for movement side effects. Advised pt to contact office if movement side effects occur.    Diagnoses and all orders for this visit:  Bipolar I disorder (HCC)  Generalized anxiety disorder -     busPIRone  (BUSPAR ) 10 MG tablet; Take 1 tablet (10 mg total) by mouth 3 (three) times daily.  Attention deficit hyperactivity disorder (ADHD), combined type  Insomnia, unspecified type     Please see  After Visit Summary for patient specific instructions.  Future Appointments  Date Time Provider Department Center  04/27/2024  9:20 AM Herold Hadassah SQUIBB, MD [facility]  05/15/2024  8:15 AM [facility]SE [facility] None    No orders of the defined types were placed in this encounter.     -------------------------------

## 2024-04-13 ENCOUNTER — Other Ambulatory Visit: Payer: Self-pay | Admitting: Adult Health

## 2024-04-13 DIAGNOSIS — F411 Generalized anxiety disorder: Secondary | ICD-10-CM

## 2024-04-19 ENCOUNTER — Encounter: Payer: Self-pay | Admitting: Adult Health

## 2024-04-19 ENCOUNTER — Telehealth: Admitting: Adult Health

## 2024-04-19 DIAGNOSIS — F319 Bipolar disorder, unspecified: Secondary | ICD-10-CM | POA: Diagnosis not present

## 2024-04-19 DIAGNOSIS — G4700 Insomnia, unspecified: Secondary | ICD-10-CM

## 2024-04-19 DIAGNOSIS — F411 Generalized anxiety disorder: Secondary | ICD-10-CM | POA: Diagnosis not present

## 2024-04-19 DIAGNOSIS — F902 Attention-deficit hyperactivity disorder, combined type: Secondary | ICD-10-CM

## 2024-04-19 NOTE — Progress Notes (Signed)
 Emily Hernandez [id number] Jun 29, 1996 28 y.o.  Virtual Visit via Video Note  I connected with pt @ on 04/19/24 at 11:30 AM EDT by a video enabled telemedicine application and verified that I am speaking with the correct person using two identifiers.   I discussed the limitations of evaluation and management by telemedicine and the availability of in person appointments. The patient expressed understanding and agreed to proceed.  I discussed the assessment and treatment plan with the patient. The patient was provided an opportunity to ask questions and all were answered. The patient agreed with the plan and demonstrated an understanding of the instructions.   The patient was advised to call back or seek an in-person evaluation if the symptoms worsen or if the condition fails to improve as anticipated.  I provided 25 minutes of non-face-to-face time during this encounter.  The patient was located at home.  The provider was located at [facility].   [name], NP   Subjective:   Patient ID:  Emily Hernandez is a 28 y.o. (DOB 06-12-1996) female.  Chief Complaint: No chief complaint on file.   HPI Emily Hernandez presents for follow-up of ADHD, GAD, insomnia and Bipolar disorder.  Describes mood today ok. Pleasant. Flat. Reports decreased tearfulness. Mood symptoms - reports decreased anxiety, depression and irritability. Reports struggling with being patient - trying to do better at work and at home. Reports varying interest and motivation. Denies panic attacks. Reports worry, rumination and over thinking. Reports obsessive thoughts, denies acts. Reports feeling overwhelmed. Denies manic symptoms. Reports increased situational stressors - home repair - son. Reports mood is lower. Stating I feel a little bit better. Reports she has not taken Buspar  as prescribed - but will work on it. Taking other medications as prescribed. Energy levels lower. Active, has  a regular exercise routine - walking 20,000 steps daily.  Enjoys some usual interests and activities. Lives with boyfriend and 71 year old son. Family local and supportive.  Appetite adequate - reports overeating, stress eating and binge eating. Reports weight gain. Has an appointment with PCP to discuss weight loss medication. Reports sleeping better some nights than others - broken sleep. Focus and concentration improved with Adderall 20mg  daily. Completing tasks. Managing aspects of household. 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 Denies AH or VH. Denies self harm. Denies substance use.   Previous medication trials: Latuda, Abilify , Lamictal -hives, Topamax , Vraylar .  Review of Systems:  Review of Systems  Musculoskeletal:  Negative for gait problem.  Neurological:  Negative for tremors.  Psychiatric/Behavioral:         Please refer to HPI    Medications: I have reviewed the patient's current medications.  Current Outpatient Medications  Medication Sig Dispense Refill   amphetamine -dextroamphetamine  (ADDERALL) 20 MG tablet Take 1 tablet (20 mg total) by mouth daily. 30 tablet 0   amphetamine -dextroamphetamine  (ADDERALL) 20 MG tablet Take 1 tablet (20 mg total) by mouth daily. 30 tablet 0   buPROPion  (WELLBUTRIN  XL) 300 MG 24 hr tablet Take 1 tablet (300 mg total) by mouth daily. 30 tablet 2   busPIRone  (BUSPAR ) 10 MG tablet Take 1 tablet (10 mg total) by mouth 3 (three) times daily. 90 tablet 2   cariprazine  (VRAYLAR ) 1.5 MG capsule Take 1 capsule (1.5 mg total) by mouth daily. 30 capsule 5   medroxyPROGESTERone  (DEPO-PROVERA ) 150 MG/ML injection Inject 150 mg into the muscle every 3 (three) months.     topiramate  (  TOPAMAX ) 50 MG tablet Take 1 tablet (50 mg total) by mouth 2 (two) times daily. 60 tablet 2   triamcinolone  cream (KENALOG ) 0.1 % Apply 1 Application topically 2 (two) times daily. 30 g 0   valACYclovir  (VALTREX ) 1000 MG tablet Take 2 tabs BID x 1 day prn sx  30 tablet 1   No current facility-administered medications for this visit.    Medication Side Effects: None  Allergies:  Allergies  Allergen Reactions   Penicillins Hives, Other (See Comments) and Swelling    Swelling and Hives        Hives, throat swells    Past Medical History:  Diagnosis Date   Bipolar disorder (HCC)    Cold sore    Depression    Dysplastic nevus 10/15/2020   Left upper back, severe. Exc 02/03/21 2:30pm.   Encounter for planned induction of labor 12/05/2022   Idiopathic intracranial hypertension    Urticaria    Vaccine for human papilloma virus (HPV) types 6, 11, 16, and 18 administered     Family History  Problem Relation Age of Onset   Allergic rhinitis Mother    Healthy Mother    Healthy Father    Heart attack Maternal Grandmother    GI Bleed Maternal Grandfather    Cancer Paternal Grandmother    Healthy Half-Brother    Urticaria Half-Sister    Allergic rhinitis Half-Sister    Healthy Half-Sister    Asthma Half-Sister    Allergic rhinitis Half-Sister    Healthy Half-Sister    Breast cancer Neg Hx    Ovarian cancer Neg Hx    Colon cancer Neg Hx    Diabetes Neg Hx    Angioedema Neg Hx    Eczema Neg Hx     Social History   Socioeconomic History   Marital status: Single    Spouse name: Government social research officer   Number of children: 1   Years of education: 14   Highest education level: Some college, no degree  Occupational History   Occupation: [employer]  Tobacco Use   Smoking status: Former    Current packs/day: 0.00    Average packs/day: 0.3 packs/day for 2.0 years (0.5 ttl pk-yrs)    Types: Cigarettes    Start date: 04/29/2019    Quit date: 04/28/2021    Years since quitting: 2.9    Passive exposure: Past   Smokeless tobacco: Never  Vaping Use   Vaping status: Every Day   Last attempt to quit: 04/14/2021   Substances: Nicotine   Substance and Sexual Activity   Alcohol use: Yes    Comment: social   Drug use: No   Sexual activity: Yes     Partners: Male    Birth control/protection: Injection  Other Topics Concern   Not on file  Social History Narrative   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   Social Drivers of Corporate investment banker Strain: Low Risk  (08/24/2022)   Overall Financial Resource Strain (CARDIA)    Difficulty of Paying Living Expenses: Not very hard  Food Insecurity: No Food Insecurity (03/31/2023)   Hunger Vital Sign    Worried About Running Out of Food in the Last Year: Never true    Ran Out of Food in the Last Year: Never true  Transportation Needs: No Transportation Needs (03/31/2023)   PRAPARE - Administrator, Civil Service (Medical): No  Lack of Transportation (Non-Medical): No  Physical Activity: Insufficiently Active (08/24/2022)   Exercise Vital Sign    Days of Exercise per Week: 2 days    Minutes of Exercise per Session: 30 min  Stress: No Stress Concern Present (08/24/2022)   Emily Hernandez of Occupational Health - Occupational Stress Questionnaire    Feeling of Stress : Not at all  Social Connections: Moderately Isolated (08/24/2022)   Social Connection and Isolation Panel    Frequency of Communication with Friends and Family: More than three times a week    Frequency of Social Gatherings with Friends and Family: Once a week    Attends Religious Services: Never    Database administrator or Organizations: No    Attends Banker Meetings: Never    Marital Status: Living with partner  Intimate Partner Violence: Not At Risk (03/31/2023)   Humiliation, Afraid, Rape, and Kick questionnaire    Fear of Current or Ex-Partner: No    Emotionally Abused: No    Physically Abused: No    Sexually Abused: No    Past Medical History, Surgical history, Social history, and Family history were reviewed and updated as appropriate.   Please see review of systems for further details on the  patient's review from today.   Objective:   Physical Exam:  There were no vitals taken for this visit.  Physical Exam Constitutional:      General: She is not in acute distress. Musculoskeletal:        General: No deformity.  Neurological:     Mental Status: She is alert and oriented to person, place, and time.     Coordination: Coordination normal.  Psychiatric:        Attention and Perception: Attention and perception normal. She does not perceive auditory or visual hallucinations.        Mood and Affect: Mood normal. Mood is not anxious or depressed. Affect is not labile, blunt, angry or inappropriate.        Speech: Speech normal.        Behavior: Behavior normal.        Thought Content: Thought content normal. Thought content is not paranoid or delusional. Thought content does not include homicidal or suicidal ideation. Thought content does not include homicidal or suicidal plan.        Cognition and Memory: Cognition and memory normal.        Judgment: Judgment normal.     Comments: Insight intact     Lab Review:     Component Value Date/Time   NA 140 09/02/2023 1608   K 4.3 09/02/2023 1608   CL 110 (H) 09/02/2023 1608   CO2 17 (L) 09/02/2023 1608   GLUCOSE 80 09/02/2023 1608   GLUCOSE 96 04/10/2022 1051   BUN 14 09/02/2023 1608   CREATININE 0.80 09/02/2023 1608   CREATININE 0.75 01/30/2014 1309   CALCIUM 10.0 09/02/2023 1608   PROT 7.0 09/02/2023 1608   ALBUMIN 4.7 09/02/2023 1608   AST 14 09/02/2023 1608   ALT 15 09/02/2023 1608   ALKPHOS 65 09/02/2023 1608   BILITOT 0.7 09/02/2023 1608   GFRNONAA >60 04/10/2022 1051   GFRAA >60 05/01/2019 1303       Component Value Date/Time   WBC 13.7 (H) 04/02/2023 0418   RBC 3.16 (L) 04/02/2023 0418   HGB 8.7 (L) 04/02/2023 0418   HGB 11.0 (L) 01/12/2023 0914   HCT 26.8 (L) 04/02/2023 0418   HCT 33.8 (L) 01/12/2023 0914  PLT 186 04/02/2023 0418   PLT 178 01/12/2023 0914   MCV 84.8 04/02/2023 0418   MCV 89  01/12/2023 0914   MCH 27.5 04/02/2023 0418   MCHC 32.5 04/02/2023 0418   RDW 14.2 04/02/2023 0418   RDW 11.9 01/12/2023 0914   LYMPHSABS 1.3 01/12/2023 0914   MONOABS 0.4 09/03/2008 2035   EOSABS 0.1 01/12/2023 0914   BASOSABS 0.0 01/12/2023 0914    No results found for: POCLITH, LITHIUM   No results found for: PHENYTOIN, PHENOBARB, VALPROATE, CBMZ   .res Assessment: Plan:    Plan:  Buspar  10mg  TID for anxiety and irritability. Vraylar  1.5mg  daily Adderall 20mg  daily Topamax  100mg  daily Wellbutrin  XL 300mg  every morning   Plans to meet with PCP about weight loss  RTC 4 weeks  25 minutes spent dedicated to the care of this patient on the date of this encounter to include pre-visit review of records, ordering of medication, post visit documentation, and face-to-face time with the patient discussing ADHD, GAD, insomnia and Bipolar 1 disorder. Discussed continuing current medication regimen.  Patient advised to contact office with any questions, adverse effects, or acute worsening in signs and symptoms.  Discussed potential benefits, risks, and side effects of stimulants with patient to include increased heart rate, palpitations, insomnia, increased anxiety, increased irritability, or decreased appetite.  Instructed patient to contact office if experiencing any significant tolerability issues.   Discussed potential metabolic side effects associated with atypical antipsychotics, as well as potential risk for movement side effects. Advised pt to contact office if movement side effects occur.     Diagnoses and all orders for this visit:  Bipolar I disorder (HCC)  Generalized anxiety disorder  Attention deficit hyperactivity disorder (ADHD), combined type  Insomnia, unspecified type     Please see After Visit Summary for patient specific instructions.  Future Appointments  Date Time Provider Department Center  04/27/2024  9:20 AM Herold Hadassah SQUIBB, MD [facility]  [address]  05/15/2024  8:15 AM AOB-NURSE [facility] None    No orders of the defined types were placed in this encounter.     -------------------------------

## 2024-04-27 ENCOUNTER — Encounter: Payer: Self-pay | Admitting: Pediatrics

## 2024-04-27 ENCOUNTER — Ambulatory Visit (INDEPENDENT_AMBULATORY_CARE_PROVIDER_SITE_OTHER): Admitting: Pediatrics

## 2024-04-27 VITALS — BP 117/83 | HR 99 | Temp 98.4°F | Ht 63.0 in | Wt 210.2 lb

## 2024-04-27 DIAGNOSIS — Z1330 Encounter for screening examination for mental health and behavioral disorders, unspecified: Secondary | ICD-10-CM | POA: Diagnosis not present

## 2024-04-27 DIAGNOSIS — Z7689 Persons encountering health services in other specified circumstances: Secondary | ICD-10-CM | POA: Diagnosis not present

## 2024-04-27 DIAGNOSIS — Z6837 Body mass index (BMI) 37.0-37.9, adult: Secondary | ICD-10-CM | POA: Diagnosis not present

## 2024-04-27 DIAGNOSIS — R21 Rash and other nonspecific skin eruption: Secondary | ICD-10-CM | POA: Diagnosis not present

## 2024-04-27 MED ORDER — KETOCONAZOLE 2 % EX CREA: 1.0000 | TOPICAL_CREAM | Freq: Two times a day (BID) | CUTANEOUS | 0 refills | Status: AC

## 2024-04-27 NOTE — Patient Instructions (Addendum)
 Weight medications:  Injectable medications: GLP1 agonist like ozempic, wegovy, zepbound, mounjaro  Once weekly injections Most common side effects: nausea, diarrhea, abdominal pain, constipation, acid reflux Higher risk for pancreatitis (inflammation of pancreas) - if developed this while on the medication would stop it Contraindications: history of medullary thyroid cancer, MEN2 disorders, pregnancy Depending on which injection, can see between 14-26% body weight loss  Oral medications - can do single pill or combined as below Welbutrin/naltrexone SE: headaches, insomnia, increased anxiety 8-10% weight loss Phentermine/topiramate SE: headaches, insomnia, increased anxiety 8-10% weight loss With topiramate: nausea is most common Orlistat SE: greasy stools, abdominal pain 2% weight loss   Good to meet you! Welcome to [facility]!  As your primary care doctor, I look forward to working with you to help you reach your health goals.  Please be aware of a couple of logistical items: - If you message me on mychart, it may take me 1-2 business days to get back to you. This is for non-urgent messaging.  - If you require urgent clinical attention, please call the clinic or present to urgent care/emergency room - If you have labs, I typically will send a message about them in 1-2 business days. - I am not here on Mondays, otherwise will be available from Tuesday-Friday during 8a-5pm.

## 2024-04-27 NOTE — Assessment & Plan Note (Signed)
 Difficulty losing weight post-pregnancy despite lifestyle changes. Discussed GLP-1 agonists for weight loss, potential benefits, side effects, and insurance coverage challenges.  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 - Order labs for insurance support. - Provide information on GLP-1 agonists and side effects.

## 2024-04-27 NOTE — Progress Notes (Addendum)
 Establish Care Note  BP 117/83 (BP Location: Left Arm, Patient Position: Sitting, Cuff Size: Large)   Pulse 99   Temp 98.4 F (36.9 C)   Ht 5' 3 (1.6 m)   Wt 210 lb 3.2 oz (95.3 kg)   SpO2 98%   BMI 37.24 kg/m    Subjective:    Patient ID: Emily Hernandez, female    DOB: 1995/11/24, 28 y.o.   MRN: [id number]  HPI: Emily Hernandez is a 28 y.o. female  Chief Complaint  Patient presents with   Establish Care    Pt arrived bc her pysch talked to her about weight loss medications for mental health , has a rash for 6+ months, urgent care provided a rash cream triamcinolone  cream    Establishing care, the following was discussed today:  Discussed the use of AI scribe software for clinical note transcription with the patient, who gave verbal consent to proceed.  History of Present Illness   Emily Hernandez is a 28 year old female who presents with a persistent rash and difficulty with weight loss post-pregnancy.  She has been experiencing a persistent rash for over six months. Initially, it appeared as a spot on her leg, which resolved, but then reappeared on her arm and neck. The rash consists of small patches that are not raised and rarely itch, with the most severe area on her neck extending down to her chest. She has been using triamcinolone  cream daily, prescribed by urgent care, but reports no improvement. The rash has not spread further since its initial appearance.  She is struggling with weight loss following the birth of her child a year ago. Despite significant efforts, including walking over 20,000 steps a day, engaging in multiple daily walks, and adhering to a low-carb diet, she has not seen any weight loss. She drinks only water and milk, avoiding soda and tea. This struggle is impacting her mental health, and she is currently on five medications, including Depo-Provera , Wellbutrin , and Topamax . She previously used Lamictal  but discontinued it due to a rash. She  has not experienced weight gain with Depo-Provera  in the past.  She is seeing a psychiatrist monthly and has been on psychiatric medications for several years. Her weight issues began post-pregnancy, not due to medication changes. She is frustrated with her inability to lose weight despite her efforts.        Current Outpatient Medications on File Prior to Visit  Medication Sig Dispense Refill   amphetamine -dextroamphetamine  (ADDERALL) 20 MG tablet Take 1 tablet (20 mg total) by mouth daily. 30 tablet 0   amphetamine -dextroamphetamine  (ADDERALL) 20 MG tablet Take 1 tablet (20 mg total) by mouth daily. 30 tablet 0   buPROPion  (WELLBUTRIN  XL) 300 MG 24 hr tablet Take 1 tablet (300 mg total) by mouth daily. 30 tablet 2   busPIRone  (BUSPAR ) 10 MG tablet Take 1 tablet (10 mg total) by mouth 3 (three) times daily. 90 tablet 2   cariprazine  (VRAYLAR ) 1.5 MG capsule Take 1 capsule (1.5 mg total) by mouth daily. 30 capsule 5   medroxyPROGESTERone  (DEPO-PROVERA ) 150 MG/ML injection Inject 150 mg into the muscle every 3 (three) months.     topiramate  (TOPAMAX ) 50 MG tablet Take 1 tablet (50 mg total) by mouth 2 (two) times daily. 60 tablet 2   No current facility-administered medications on file prior to visit.    #HM Will review HM records and updated as needed.  Relevant past medical, surgical, family and social history  reviewed and updated as indicated. Interim medical history since our last visit reviewed. Allergies and medications reviewed and updated.  ROS per HPI unless specifically indicated above     Objective:    BP 117/83 (BP Location: Left Arm, Patient Position: Sitting, Cuff Size: Large)   Pulse 99   Temp 98.4 F (36.9 C)   Ht 5' 3 (1.6 m)   Wt 210 lb 3.2 oz (95.3 kg)   SpO2 98%   BMI 37.24 kg/m   Wt Readings from Last 3 Encounters:  04/27/24 210 lb 3.2 oz (95.3 kg)  02/28/24 202 lb (91.6 kg)  12/22/23 197 lb 14.4 oz (89.8 kg)     Physical Exam Constitutional:       Appearance: Normal appearance.  Pulmonary:     Effort: Pulmonary effort is normal.  Musculoskeletal:        General: Normal range of motion.  Skin:    Comments: Normal skin color  Neurological:     General: No focal deficit present.     Mental Status: She is alert. Mental status is at baseline.  Psychiatric:        Mood and Affect: Mood normal.        Behavior: Behavior normal.        Thought Content: Thought content normal.         09/23/2020    3:32 PM 08/02/2020    9:05 AM  Depression screen PHQ 2/9  Decreased Interest 2 0  Down, Depressed, Hopeless 2 0  PHQ - 2 Score 4 0  Altered sleeping 2 1  Tired, decreased energy 2 2  Change in appetite 2 3  Feeling bad or failure about yourself  2 3  Trouble concentrating 2 0  Moving slowly or fidgety/restless 2 0  Suicidal thoughts 0 0  PHQ-9 Score 16 9  Difficult doing work/chores Very difficult Somewhat difficult        09/23/2020    3:33 PM 08/02/2020    9:06 AM  GAD 7 : Generalized Anxiety Score  Nervous, Anxious, on Edge 3 3  Control/stop worrying 3 3  Worry too much - different things 3 3  Trouble relaxing 2 2  Restless 1 0  Easily annoyed or irritable 3 1  Afraid - awful might happen 0 0  Total GAD 7 Score 15 12  Anxiety Difficulty Very difficult Somewhat difficult       Assessment & Plan:  Assessment & Plan   BMI 37.0-37.9, adult Assessment & Plan: Difficulty losing weight post-pregnancy despite lifestyle changes. Discussed GLP-1 agonists for weight loss, potential benefits, side effects, and insurance coverage challenges.  - Has spent at least a years time - 20000/day, low carb diet - [employer] petition for Wegovy . - Order labs for insurance support. - Provide information on GLP-1 agonists and side effects.  Orders: -     Lipid panel -     Hemoglobin A1c -     Comprehensive metabolic panel with GFR -     CBC with Differential/Platelet -     TSH  Rash Persistent rash unresponsive to  triamcinolone  cream suggests possible tinea infection. Dermatology evaluation needed. - Prescribe ketoconazole  cream. - Discontinue triamcinolone  cream. - Dermatology referral. -     Ketoconazole ; Apply 1 Application topically 2 (two) times daily. Don't use for more than 15 days at a time  Dispense: 15 g; Refill: 0 -     Ambulatory referral to Dermatology  Encounter to establish care Reviewed available patient  record including history, medications, problem list. HM updated as able. Will review and/or request outside records (if applicable) and will fill remaining HM gaps as needed at follow up visit.  Encounter for behavioral health screening As part of their intake evaluation, the patient was screened for depression, anxiety.  PHQ9 SCORE 16, GAD7 SCORE 15. Screening results positive for tested conditions. See plan under problem/diagnosis above.  Clinical coverage for weight loss GLP's   Medication being dispensed is Wegovy  3 mL/28 day. Titration doses are 2 mL/28 days.   [x]  Product being prescribed is FDA approved for the indication, age, weight (if applicable) and not does not exceed dosing limits per the Prescribing Information per the clinical conditions for use.  [x]  Patient's baseline weight measured within the last 45 days as required by provider before dispensing.  [x]  Patient is new to therapy and One of the following:   [x]  The beneficiary is 30 years of age or over and has ONE of the following:  [x]  A BMI greater than or equal to 30 kg/m2   [x]  The beneficiary has participated in 6 months or greater of lifestyle modification and will continue while on medication including structured nutrition following provider recommend dietary modifications and physical activity, unless physical activity is not clinically appropriate at the time GLP1 therapy commences AND  [x]  The beneficiary will NOT be using the requested agent in combination with another GLP-1 receptor agonist agent  AND  [x]  The beneficiary does NOT have any FDA-labeled contraindications to the requested agent, including pregnancy, lactation, history of medullary thyroid  cancer or multiple endocrine neoplasia type II.   Last BMI/Weight/Height recorded Estimated body mass index is 37.24 kg/m as calculated from the following:   Height as of this encounter: 5' 3 (1.6 m).   Weight as of this encounter: 210 lb 3.2 oz (95.3 kg).   Follow up plan: Return in about 3 months (around 07/27/2024) for wt management.  Hadassah SHAUNNA Nett, MD

## 2024-04-28 LAB — CBC WITH DIFFERENTIAL/PLATELET
Basophils Absolute: 0.1 x10E3/uL (ref 0.0–0.2)
Basos: 1 %
EOS (ABSOLUTE): 0.1 x10E3/uL (ref 0.0–0.4)
Eos: 2 %
Hematocrit: 44.2 % (ref 34.0–46.6)
Hemoglobin: 13.8 g/dL (ref 11.1–15.9)
Immature Grans (Abs): 0 x10E3/uL (ref 0.0–0.1)
Immature Granulocytes: 0 %
Lymphocytes Absolute: 1.7 x10E3/uL (ref 0.7–3.1)
Lymphs: 30 %
MCH: 29 pg (ref 26.6–33.0)
MCHC: 31.2 g/dL — ABNORMAL LOW (ref 31.5–35.7)
MCV: 93 fL (ref 79–97)
Monocytes Absolute: 0.3 x10E3/uL (ref 0.1–0.9)
Monocytes: 5 %
Neutrophils Absolute: 3.4 x10E3/uL (ref 1.4–7.0)
Neutrophils: 62 %
Platelets: 230 x10E3/uL (ref 150–450)
RBC: 4.76 x10E6/uL (ref 3.77–5.28)
RDW: 12.7 % (ref 11.7–15.4)
WBC: 5.6 x10E3/uL (ref 3.4–10.8)

## 2024-04-28 LAB — COMPREHENSIVE METABOLIC PANEL WITH GFR
ALT: 16 IU/L (ref 0–32)
AST: 12 IU/L (ref 0–40)
Albumin: 4.6 g/dL (ref 4.0–5.0)
Alkaline Phosphatase: 61 IU/L (ref 44–121)
BUN/Creatinine Ratio: 22 (ref 9–23)
BUN: 19 mg/dL (ref 6–20)
Bilirubin Total: 0.3 mg/dL (ref 0.0–1.2)
CO2: 17 mmol/L — ABNORMAL LOW (ref 20–29)
Calcium: 9.7 mg/dL (ref 8.7–10.2)
Chloride: 110 mmol/L — ABNORMAL HIGH (ref 96–106)
Creatinine, Ser: 0.86 mg/dL (ref 0.57–1.00)
Globulin, Total: 2.3 g/dL (ref 1.5–4.5)
Glucose: 82 mg/dL (ref 70–99)
Potassium: 4.5 mmol/L (ref 3.5–5.2)
Sodium: 141 mmol/L (ref 134–144)
Total Protein: 6.9 g/dL (ref 6.0–8.5)
eGFR: 94 mL/min/1.73 (ref >59)

## 2024-04-28 LAB — LIPID PANEL
Chol/HDL Ratio: 3.5 ratio (ref 0.0–4.4)
Cholesterol, Total: 182 mg/dL (ref 100–199)
HDL: 52 mg/dL (ref >39)
LDL Chol Calc (NIH): 118 mg/dL — ABNORMAL HIGH (ref 0–99)
Triglycerides: 65 mg/dL (ref 0–149)
VLDL Cholesterol Cal: 12 mg/dL (ref 5–40)

## 2024-04-28 LAB — HEMOGLOBIN A1C
Est. average glucose Bld gHb Est-mCnc: 103 mg/dL
Hgb A1c MFr Bld: 5.2 % (ref 4.8–5.6)

## 2024-04-28 LAB — TSH: TSH: 2.19 u[IU]/mL (ref 0.450–4.500)

## 2024-05-02 ENCOUNTER — Ambulatory Visit: Payer: Self-pay | Admitting: Pediatrics

## 2024-05-08 ENCOUNTER — Telehealth: Payer: Self-pay

## 2024-05-08 NOTE — Telephone Encounter (Unsigned)
 Copied from CRM [id number]. Topic: Clinical - Medication Prior Auth >> May 08, 2024  1:50 PM Tobias L wrote: Reason for CRM: Patient following up on prior authorization for wegovy . Patient states she spoke to provider about possibly getting wegovy .   Requesting callback: [phone]

## 2024-05-10 ENCOUNTER — Ambulatory Visit

## 2024-05-10 DIAGNOSIS — B360 Pityriasis versicolor: Secondary | ICD-10-CM | POA: Diagnosis not present

## 2024-05-10 DIAGNOSIS — D229 Melanocytic nevi, unspecified: Secondary | ICD-10-CM

## 2024-05-10 DIAGNOSIS — D2271 Melanocytic nevi of right lower limb, including hip: Secondary | ICD-10-CM

## 2024-05-10 MED ORDER — KETOCONAZOLE 2 % EX SHAM: MEDICATED_SHAMPOO | CUTANEOUS | 11 refills | Status: AC

## 2024-05-10 MED ORDER — WEGOVY 0.25 MG/0.5ML ~~LOC~~ SOAJ
0.2500 mg | SUBCUTANEOUS | 0 refills | Status: AC
Start: 2024-05-10 — End: ?

## 2024-05-10 NOTE — Patient Instructions (Signed)

## 2024-05-10 NOTE — Progress Notes (Signed)
    Subjective   Emily Hernandez is a 28 y.o. female who presents for the following: Rash. Patient is new patient  Here concerning history of rash that started over 6 months ago on inner thigh and spread to arms, neck, back and chest. Was  not itchy or warm to touch. Seen by urgent care but did not help rash, then seen by primary care provider and given rx of ketoconazole  cream. Reports cream did help with rash and is improving.   Patient does have pictures on phones of rash when present.  Today patient reports: She reports rash has cleared mostly with use of ketoconazole  cream  Review of Systems:    No other skin or systemic complaints except as noted in HPI or Assessment and Plan.  The following portions of the chart were reviewed this encounter and updated as appropriate: medications, allergies, medical history  Relevant Medical History:  Reviewed   Objective  Well appearing patient in no apparent distress; mood and affect are within normal limits. Examination was performed of the: Waist Up Skin Exam: scalp, head, eyes, ears, nose, lips, neck, chest, axillae, upper extremities, abdomen, back, hands, fingers, fingernails   Examination notable for: RASHES  Examination limited by: Clothing  - Thin, hypopigmented, oval macules coalescing into patches with provokable scale located on the trunk and proximal extremities Nevi - well demarcated brown macules on Right anterior ankle    Assessment & Plan   Melanocytic nevi - Benign appearing on today's exam, patient reassured - Continue active observation - Pt instructed on A,B,C, D, and Es of an evolving melanoma - Pt instructed to contact clinic if any of these worrisome changes arise - Reinforced importance of photoprotective strategies including liberal and frequent sunscreen use of a broad-spectrum SPF 30 or greater, use of protective clothing, and sun avoidance for prevention of cutaneous malignancy and photoaging.  Counseled  patient on the importance of regular self-skin monitoring as well as routine clinical skin examinations as scheduled.   Tinea Versicolor Chronic and persistent condition with duration or expected duration over one year. Condition is symptomatic and bothersome to patient. Patient is flaring and not currently at treatment goal.  - Reassured patient that this is a benign condition caused by a fungus that lives on the skin.  - Continue ketoconazole  2% cream twice daily to affected spots - Explained to patient that pigment may take much longer to re-calibrate - Use ketoconazole  2% shampoo or selenium sulfide 2.5% shampoo as body wash daily for two weeks then once a week as maintenance   Procedures, orders, diagnosis for this visit:    There are no diagnoses linked to this encounter.  Return to clinic: No follow-ups on file.  Documentation: I have reviewed the above documentation for accuracy and completeness, and I agree with the above.  I, Eleanor Blush, CMA, am acting as scribe for Lauraine JAYSON Kanaris, MD.   Lauraine JAYSON Kanaris, MD

## 2024-05-10 NOTE — Addendum Note (Signed)
 Addended by: Kenda Kloehn P on: 05/10/2024 02:37 PM   Modules accepted: Orders

## 2024-05-11 ENCOUNTER — Telehealth: Payer: Self-pay

## 2024-05-11 ENCOUNTER — Other Ambulatory Visit (HOSPITAL_COMMUNITY): Payer: Self-pay

## 2024-05-11 NOTE — Telephone Encounter (Signed)
 Pharmacy Patient Advocate Encounter   Received notification from Onbase that prior authorization for Zepbound 2.5mg /0.78ml Pen is required/requested.   Insurance verification completed.   The patient is insured through [employer] .   Per test claim: Effective October 1st, Medicaid will discontinue coverage of GLP1 medications for weight loss (such as Wegovy  and Zepbound), unless the patient has a documented history of a heart attack or stroke. Zepbound will continue to be covered only for patients with moderate to severe sleep apnea (AHI 15-30). Because of this change, the prior authorization team will not be submitting new PA requests for GLP1 medications prescribed for weight loss between now and October 1st, as patients will be unable to continue therapy under Medicaid coverage.

## 2024-05-12 ENCOUNTER — Other Ambulatory Visit (HOSPITAL_COMMUNITY): Payer: Self-pay

## 2024-05-15 ENCOUNTER — Ambulatory Visit

## 2024-05-15 VITALS — BP 116/76 | HR 76 | Ht 63.5 in | Wt 213.1 lb

## 2024-05-15 DIAGNOSIS — Z3042 Encounter for surveillance of injectable contraceptive: Secondary | ICD-10-CM

## 2024-05-15 MED ORDER — MEDROXYPROGESTERONE ACETATE 150 MG/ML IM SUSP
150.0000 mg | Freq: Once | INTRAMUSCULAR | Status: AC
  Administered 2024-05-15: 150 mg via INTRAMUSCULAR

## 2024-05-15 NOTE — Patient Instructions (Signed)

## 2024-05-15 NOTE — Progress Notes (Signed)
    NURSE VISIT NOTE  Subjective:    Patient ID: Emily Hernandez, female    DOB: [birth year], 28 y.o.   MRN: [id number]  HPI  Patient is a 28 y.o. G68P1011 female who presents for depo provera  injection.   Objective:    BP 116/76   Pulse 76   Ht 5' 3.5 (1.613 m)   Wt 213 lb 1.6 oz (96.7 kg)   Breastfeeding No   BMI 37.16 kg/m   Last Annual: 10/11/2023. Last pap: 05/14/23. Last Depo-Provera : 02/28/2024. Side Effects if any: none. Serum HCG indicated? No . Depo-Provera  150 mg IM given by: Rollo Maxin, CMA. Site: Left Deltoid  Lab Review  No results found for any visits on 05/15/24.  Assessment:   1. Surveillance for Depo-Provera  contraception      Plan:   Next appointment due between 12/15 and 08/14/24.    Rollo JINNY Maxin, CMA

## 2024-05-17 ENCOUNTER — Telehealth (INDEPENDENT_AMBULATORY_CARE_PROVIDER_SITE_OTHER): Admitting: Adult Health

## 2024-05-17 ENCOUNTER — Encounter: Payer: Self-pay | Admitting: Adult Health

## 2024-05-17 ENCOUNTER — Other Ambulatory Visit (HOSPITAL_COMMUNITY): Payer: Self-pay

## 2024-05-17 DIAGNOSIS — F902 Attention-deficit hyperactivity disorder, combined type: Secondary | ICD-10-CM

## 2024-05-17 DIAGNOSIS — F319 Bipolar disorder, unspecified: Secondary | ICD-10-CM | POA: Diagnosis not present

## 2024-05-17 DIAGNOSIS — G4700 Insomnia, unspecified: Secondary | ICD-10-CM

## 2024-05-17 DIAGNOSIS — F411 Generalized anxiety disorder: Secondary | ICD-10-CM

## 2024-05-17 DIAGNOSIS — F909 Attention-deficit hyperactivity disorder, unspecified type: Secondary | ICD-10-CM | POA: Diagnosis not present

## 2024-05-17 NOTE — Progress Notes (Signed)
 Emily Hernandez [id number] 01-07-1996 28 y.o.  Virtual Visit via Video Note  I connected with pt @ on 05/17/24 at 11:00 AM EDT by a video enabled telemedicine application and verified that I am speaking with the correct person using two identifiers.   I discussed the limitations of evaluation and management by telemedicine and the availability of in person appointments. The patient expressed understanding and agreed to proceed.  I discussed the assessment and treatment plan with the patient. The patient was provided an opportunity to ask questions and all were answered. The patient agreed with the plan and demonstrated an understanding of the instructions.   The patient was advised to call back or seek an in-person evaluation if the symptoms worsen or if the condition fails to improve as anticipated.  I provided 25 minutes of non-face-to-face time during this encounter.  The patient was located at home.  The provider was located at [facility].   [name], NP   Subjective:   Patient ID:  Emily Hernandez is a 28 y.o. (DOB 1996/07/16) female.  Chief Complaint: No chief complaint on file.   HPI Emily Hernandez presents for follow-up of ADHD, GAD, insomnia and Bipolar disorder.  Describes mood today not too good. Pleasant. Denies tearfulness. Mood symptoms - reports anxiety, depression and irritability. Reports feeling over whelmed. Reports varying interest and motivation. Denies panic attacks. Reports worry, rumination and over thinking. Denies obsessive thoughts, or acts. Reports she has been more patient - at work and at home. Denies manic symptoms. Reports increased situational stressors with selling her home.  Reports mood is lower. Stating I feel like I'm doing the best I can with all the stuff I have going on. Reports she has not taken medications in 2 weeks, but plans to be more consistent.  Energy levels lower. Active, has a regular exercise  routine - walking 20,000 steps daily.  Enjoys some usual interests and activities. Lives with boyfriend and 72 year old son. Family local and supportive.  Appetite varies - reports overeating, stress eating and binge eating. Reports weight gain. Working with PCP - starting Dela. Reports sleeping better some nights than others - it's decent. Averages 6 to 8 hours. Focus and concentration improved with Adderall 20mg  daily. Completing tasks. Managing aspects of household. 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 Denies AH or VH. Denies self harm. Denies substance use.   Previous medication trials: Latuda, Abilify , Lamictal -hives, Topamax , Vraylar .   Review of Systems:  Review of Systems  Musculoskeletal:  Negative for gait problem.  Neurological:  Negative for tremors.  Psychiatric/Behavioral:         Please refer to HPI    Medications: I have reviewed the patient's current medications.  Current Outpatient Medications  Medication Sig Dispense Refill   amphetamine -dextroamphetamine  (ADDERALL) 20 MG tablet Take 1 tablet (20 mg total) by mouth daily. 30 tablet 0   amphetamine -dextroamphetamine  (ADDERALL) 20 MG tablet Take 1 tablet (20 mg total) by mouth daily. 30 tablet 0   buPROPion  (WELLBUTRIN  XL) 300 MG 24 hr tablet Take 1 tablet (300 mg total) by mouth daily. 30 tablet 2   busPIRone  (BUSPAR ) 10 MG tablet Take 1 tablet (10 mg total) by mouth 3 (three) times daily. 90 tablet 2   cariprazine  (VRAYLAR ) 1.5 MG capsule Take 1 capsule (1.5 mg total) by mouth daily. 30 capsule 5   ketoconazole  (NIZORAL ) 2 % cream Apply 1 Application topically 2 (two) times daily.  Don't use for more than 15 days at a time 15 g 0   ketoconazole  (NIZORAL ) 2 % shampoo Use topically as body wash to affected areas of rash for 2 weeks when flared,  let sit on skin for at least 5 to 7 minutes before rinsing off, when no longer flared can use once weekly for maintenance 120 mL 11   medroxyPROGESTERone   (DEPO-PROVERA ) 150 MG/ML injection Inject 150 mg into the muscle every 3 (three) months.     semaglutide -weight management (WEGOVY ) 0.25 MG/0.5ML SOAJ SQ injection Inject 0.25 mg into the skin once a week. 2 mL 0   topiramate  (TOPAMAX ) 50 MG tablet Take 1 tablet (50 mg total) by mouth 2 (two) times daily. 60 tablet 2   No current facility-administered medications for this visit.    Medication Side Effects: None  Allergies:  Allergies  Allergen Reactions   Penicillins Hives, Other (See Comments) and Swelling    Swelling and Hives        Hives, throat swells    Past Medical History:  Diagnosis Date   Bipolar disorder (HCC)    Cold sore    Depression    Dysplastic nevus 10/15/2020   Left upper back, severe. Exc 02/03/21 2:30pm.   Encounter for planned induction of labor 12/05/2022   Idiopathic intracranial hypertension    Urticaria    Vaccine for human papilloma virus (HPV) types 6, 11, 16, and 18 administered     Family History  Problem Relation Age of Onset   Allergic rhinitis Mother    Healthy Mother    Healthy Father    Heart attack Maternal Grandmother    GI Bleed Maternal Grandfather    Cancer Paternal Grandmother    Healthy Half-Brother    Urticaria Half-Sister    Allergic rhinitis Half-Sister    Healthy Half-Sister    Asthma Half-Sister    Allergic rhinitis Half-Sister    Healthy Half-Sister    Breast cancer Neg Hx    Ovarian cancer Neg Hx    Colon cancer Neg Hx    Diabetes Neg Hx    Angioedema Neg Hx    Eczema Neg Hx     Social History   Socioeconomic History   Marital status: Single    Spouse name: Government social research officer   Number of children: 1   Years of education: 14   Highest education level: Some college, no degree  Occupational History   Occupation: [employer]  Tobacco Use   Smoking status: Former    Current packs/day: 0.00    Average packs/day: 0.3 packs/day for 2.0 years (0.5 ttl pk-yrs)    Types: Cigarettes    Start date: 04/29/2019    Quit date:  04/28/2021    Years since quitting: 3.0    Passive exposure: Past   Smokeless tobacco: Never  Vaping Use   Vaping status: Every Day   Last attempt to quit: 04/14/2021   Substances: Nicotine   Substance and Sexual Activity   Alcohol use: Yes    Comment: social   Drug use: No   Sexual activity: Yes    Partners: Male    Birth control/protection: Injection  Other Topics Concern   Not on file  Social History Narrative   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   Social Drivers of Health   Financial Resource Strain: Low Risk  (08/24/2022)   Overall Financial Resource Strain (CARDIA)  Difficulty of Paying Living Expenses: Not very hard  Food Insecurity: No Food Insecurity (03/31/2023)   Hunger Vital Sign    Worried About Running Out of Food in the Last Year: Never true    Ran Out of Food in the Last Year: Never true  Transportation Needs: No Transportation Needs (03/31/2023)   PRAPARE - Administrator, Civil Service (Medical): No    Lack of Transportation (Non-Medical): No  Physical Activity: Insufficiently Active (08/24/2022)   Exercise Vital Sign    Days of Exercise per Week: 2 days    Minutes of Exercise per Session: 30 min  Stress: No Stress Concern Present (08/24/2022)   [employer] of Occupational Health - Occupational Stress Questionnaire    Feeling of Stress : Not at all  Social Connections: Moderately Isolated (08/24/2022)   Social Connection and Isolation Panel    Frequency of Communication with Friends and Family: More than three times a week    Frequency of Social Gatherings with Friends and Family: Once a week    Attends Religious Services: Never    Database administrator or Organizations: No    Attends Banker Meetings: Never    Marital Status: Living with partner  Intimate Partner Violence: Not At Risk (03/31/2023)   Humiliation, Afraid, Rape, and Kick  questionnaire    Fear of Current or Ex-Partner: No    Emotionally Abused: No    Physically Abused: No    Sexually Abused: No    Past Medical History, Surgical history, Social history, and Family history were reviewed and updated as appropriate.   Please see review of systems for further details on the patient's review from today.   Objective:   Physical Exam:  There were no vitals taken for this visit.  Physical Exam Constitutional:      General: She is not in acute distress. Musculoskeletal:        General: No deformity.  Neurological:     Mental Status: She is alert and oriented to person, place, and time.     Coordination: Coordination normal.  Psychiatric:        Attention and Perception: Attention and perception normal. She does not perceive auditory or visual hallucinations.        Mood and Affect: Mood normal. Mood is not anxious or depressed. Affect is not labile, blunt, angry or inappropriate.        Speech: Speech normal.        Behavior: Behavior normal.        Thought Content: Thought content normal. Thought content is not paranoid or delusional. Thought content does not include homicidal or suicidal ideation. Thought content does not include homicidal or suicidal plan.        Cognition and Memory: Cognition and memory normal.        Judgment: Judgment normal.     Comments: Insight intact     Lab Review:     Component Value Date/Time   NA 141 04/27/2024 0945   K 4.5 04/27/2024 0945   CL 110 (H) 04/27/2024 0945   CO2 17 (L) 04/27/2024 0945   GLUCOSE 82 04/27/2024 0945   GLUCOSE 96 04/10/2022 1051   BUN 19 04/27/2024 0945   CREATININE 0.86 04/27/2024 0945   CREATININE 0.75 01/30/2014 1309   CALCIUM 9.7 04/27/2024 0945   PROT 6.9 04/27/2024 0945   ALBUMIN 4.6 04/27/2024 0945   AST 12 04/27/2024 0945   ALT 16 04/27/2024 0945   ALKPHOS 61  04/27/2024 0945   BILITOT 0.3 04/27/2024 0945   GFRNONAA >60 04/10/2022 1051   GFRAA >60 05/01/2019 1303        Component Value Date/Time   WBC 5.6 04/27/2024 0945   WBC 13.7 (H) 04/02/2023 0418   RBC 4.76 04/27/2024 0945   RBC 3.16 (L) 04/02/2023 0418   HGB 13.8 04/27/2024 0945   HCT 44.2 04/27/2024 0945   PLT 230 04/27/2024 0945   MCV 93 04/27/2024 0945   MCH 29.0 04/27/2024 0945   MCH 27.5 04/02/2023 0418   MCHC 31.2 (L) 04/27/2024 0945   MCHC 32.5 04/02/2023 0418   RDW 12.7 04/27/2024 0945   LYMPHSABS 1.7 04/27/2024 0945   MONOABS 0.4 09/03/2008 2035   EOSABS 0.1 04/27/2024 0945   BASOSABS 0.1 04/27/2024 0945    No results found for: POCLITH, LITHIUM   No results found for: PHENYTOIN, PHENOBARB, VALPROATE, CBMZ   .res Assessment: Plan:    Plan:  Restart: Vraylar  1.5mg  daily Wellbutrin  XL 300mg  every morning - 150mg  x 7 days, then 300mg  daily  Hold: Adderall 20mg  daily Topamax  100mg  daily Buspar  10mg  TID for anxiety and irritability.  RTC 4 weeks  25 minutes spent dedicated to the care of this patient on the date of this encounter to include pre-visit review of records, ordering of medication, post visit documentation, and face-to-face time with the patient discussing ADHD, GAD, insomnia and Bipolar 1 disorder. Discussed continuing current medication regimen.  Patient advised to contact office with any questions, adverse effects, or acute worsening in signs and symptoms.  Discussed potential benefits, risks, and side effects of stimulants with patient to include increased heart rate, palpitations, insomnia, increased anxiety, increased irritability, or decreased appetite.  Instructed patient to contact office if experiencing any significant tolerability issues.   Discussed potential metabolic side effects associated with atypical antipsychotics, as well as potential risk for movement side effects. Advised pt to contact office if movement side effects occur.    There are no diagnoses linked to this encounter.   Please see After Visit Summary for patient  specific instructions.  Future Appointments  Date Time Provider Department Center  07/28/2024  9:00 AM Herold Hadassah SQUIBB, MD [facility] [address]  07/31/2024  8:15 AM [facility] None    No orders of the defined types were placed in this encounter.     -------------------------------

## 2024-05-25 NOTE — Telephone Encounter (Signed)
 Called [facility] they no longer distribute tirzepatide and for 1 vial of the semaglutide  it is 549$

## 2024-06-14 ENCOUNTER — Encounter: Payer: Self-pay | Admitting: Adult Health

## 2024-06-14 ENCOUNTER — Telehealth (INDEPENDENT_AMBULATORY_CARE_PROVIDER_SITE_OTHER): Admitting: Adult Health

## 2024-06-14 DIAGNOSIS — F411 Generalized anxiety disorder: Secondary | ICD-10-CM | POA: Diagnosis not present

## 2024-06-14 DIAGNOSIS — F319 Bipolar disorder, unspecified: Secondary | ICD-10-CM

## 2024-06-14 DIAGNOSIS — G4700 Insomnia, unspecified: Secondary | ICD-10-CM

## 2024-06-14 DIAGNOSIS — F902 Attention-deficit hyperactivity disorder, combined type: Secondary | ICD-10-CM

## 2024-06-14 DIAGNOSIS — F909 Attention-deficit hyperactivity disorder, unspecified type: Secondary | ICD-10-CM | POA: Diagnosis not present

## 2024-06-14 NOTE — Progress Notes (Signed)
 Emily Hernandez [id number] [birth year] 28 y.o.  Virtual Visit via Video Note  I connected with pt @ on 06/14/24 at  9:30 AM EDT by a video enabled telemedicine application and verified that I am speaking with the correct person using two identifiers.   I discussed the limitations of evaluation and management by telemedicine and the availability of in person appointments. The patient expressed understanding and agreed to proceed.  I discussed the assessment and treatment plan with the patient. The patient was provided an opportunity to ask questions and all were answered. The patient agreed with the plan and demonstrated an understanding of the instructions.   The patient was advised to call back or seek an in-person evaluation if the symptoms worsen or if the condition fails to improve as anticipated.  I provided 25 minutes of non-face-to-face time during this encounter.  The patient was located at home.  The provider was located at [facility].   [name], NP   Subjective:   Patient ID:  Emily Hernandez is a 28 y.o. (DOB [birth year]) female.  Chief Complaint: No chief complaint on file.   HPI Emily Hernandez presents for follow-up of ADHD, GAD, insomnia and Bipolar disorder.  Describes mood today about the same. Pleasant. Reports some tearfulness. Mood symptoms - reports anxiety, depression and irritability. Reports feeling overwhelmed - multiple stressors. Reports lower interest and motivation. Denies panic attacks. Reports worry, rumination and over thinking. Denies obsessive thoughts, or acts. Denies manic symptoms.   Reports mood is lower. Stating I feel like I've been worse - but I haven't been taking my medications. Reports she is not taking medications consistently but plans to try and be more consistent.  Energy levels lower. Active, has a regular exercise routine - walking. Enjoys some usual interests and activities. Lives with boyfriend and 56  year old son. Family local and supportive.  Appetite up and down - reports overeating, stress eating and binge eating. Reports weight stable. Working with PCP - planned to start Wegoy - but can't afford it. Reports sleeping better some nights than others - depends on  the baby.  Focus and concentration not great - has not been taking the Adderall.  Completing tasks. Managing aspects of household. 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 Denies AH or VH. Denies self harm. Denies substance use.   Previous medication trials: Latuda, Abilify , Lamictal -hives, Topamax , Vraylar .  Review of Systems:  Review of Systems  Musculoskeletal:  Negative for gait problem.  Neurological:  Negative for tremors.  Psychiatric/Behavioral:         Please refer to HPI    Medications: I have reviewed the patient's current medications.  Current Outpatient Medications  Medication Sig Dispense Refill   amphetamine -dextroamphetamine  (ADDERALL) 20 MG tablet Take 1 tablet (20 mg total) by mouth daily. 30 tablet 0   amphetamine -dextroamphetamine  (ADDERALL) 20 MG tablet Take 1 tablet (20 mg total) by mouth daily. 30 tablet 0   buPROPion  (WELLBUTRIN  XL) 300 MG 24 hr tablet Take 1 tablet (300 mg total) by mouth daily. 30 tablet 2   busPIRone  (BUSPAR ) 10 MG tablet Take 1 tablet (10 mg total) by mouth 3 (three) times daily. 90 tablet 2   cariprazine  (VRAYLAR ) 1.5 MG capsule Take 1 capsule (1.5 mg total) by mouth daily. 30 capsule 5   ketoconazole  (NIZORAL ) 2 % cream Apply 1 Application topically 2 (two) times daily. Don't use for more than 15 days at a time  15 g 0   ketoconazole  (NIZORAL ) 2 % shampoo Use topically as body wash to affected areas of rash for 2 weeks when flared,  let sit on skin for at least 5 to 7 minutes before rinsing off, when no longer flared can use once weekly for maintenance 120 mL 11   medroxyPROGESTERone  (DEPO-PROVERA ) 150 MG/ML injection Inject 150 mg into the muscle every 3  (three) months.     semaglutide -weight management (WEGOVY ) 0.25 MG/0.5ML SOAJ SQ injection Inject 0.25 mg into the skin once a week. 2 mL 0   topiramate  (TOPAMAX ) 50 MG tablet Take 1 tablet (50 mg total) by mouth 2 (two) times daily. 60 tablet 2   No current facility-administered medications for this visit.    Medication Side Effects: None  Allergies:  Allergies  Allergen Reactions   Penicillins Hives, Other (See Comments) and Swelling    Swelling and Hives        Hives, throat swells    Past Medical History:  Diagnosis Date   Bipolar disorder (HCC)    Cold sore    Depression    Dysplastic nevus 10/15/2020   Left upper back, severe. Exc 02/03/21 2:30pm.   Encounter for planned induction of labor 12/05/2022   Idiopathic intracranial hypertension    Urticaria    Vaccine for human papilloma virus (HPV) types 6, 11, 16, and 18 administered     Family History  Problem Relation Age of Onset   Allergic rhinitis Mother    Healthy Mother    Healthy Father    Heart attack Maternal Grandmother    GI Bleed Maternal Grandfather    Cancer Paternal Grandmother    Healthy Half-Brother    Urticaria Half-Sister    Allergic rhinitis Half-Sister    Healthy Half-Sister    Asthma Half-Sister    Allergic rhinitis Half-Sister    Healthy Half-Sister    Breast cancer Neg Hx    Ovarian cancer Neg Hx    Colon cancer Neg Hx    Diabetes Neg Hx    Angioedema Neg Hx    Eczema Neg Hx     Social History   Socioeconomic History   Marital status: Single    Spouse name: Government Social Research Officer   Number of children: 1   Years of education: 14   Highest education level: Some college, no degree  Occupational History   Occupation: [employer]  Tobacco Use   Smoking status: Former    Current packs/day: 0.00    Average packs/day: 0.3 packs/day for 2.0 years (0.5 ttl pk-yrs)    Types: Cigarettes    Start date: 04/29/2019    Quit date: 04/28/2021    Years since quitting: 3.1    Passive exposure: Past    Smokeless tobacco: Never  Vaping Use   Vaping status: Every Day   Last attempt to quit: 04/14/2021   Substances: Nicotine   Substance and Sexual Activity   Alcohol use: Yes    Comment: social   Drug use: No   Sexual activity: Yes    Partners: Male    Birth control/protection: Injection  Other Topics Concern   Not on file  Social History Narrative   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   Social Drivers of Corporate Investment Banker Strain: Low Risk  (08/24/2022)   Overall Financial Resource Strain (CARDIA)    Difficulty of Paying Living Expenses: Not very hard  Food Insecurity: No Food Insecurity (03/31/2023)   Hunger Vital Sign    Worried About Running Out of Food in the Last Year: Never true    Ran Out of Food in the Last Year: Never true  Transportation Needs: No Transportation Needs (03/31/2023)   PRAPARE - Administrator, Civil Service (Medical): No    Lack of Transportation (Non-Medical): No  Physical Activity: Insufficiently Active (08/24/2022)   Exercise Vital Sign    Days of Exercise per Week: 2 days    Minutes of Exercise per Session: 30 min  Stress: No Stress Concern Present (08/24/2022)   [employer] of Occupational Health - Occupational Stress Questionnaire    Feeling of Stress : Not at all  Social Connections: Moderately Isolated (08/24/2022)   Social Connection and Isolation Panel    Frequency of Communication with Friends and Family: More than three times a week    Frequency of Social Gatherings with Friends and Family: Once a week    Attends Religious Services: Never    Database Administrator or Organizations: No    Attends Banker Meetings: Never    Marital Status: Living with partner  Intimate Partner Violence: Not At Risk (03/31/2023)   Humiliation, Afraid, Rape, and Kick questionnaire    Fear of Current or Ex-Partner: No    Emotionally  Abused: No    Physically Abused: No    Sexually Abused: No    Past Medical History, Surgical history, Social history, and Family history were reviewed and updated as appropriate.   Please see review of systems for further details on the patient's review from today.   Objective:   Physical Exam:  There were no vitals taken for this visit.  Physical Exam Constitutional:      General: She is not in acute distress. Musculoskeletal:        General: No deformity.  Neurological:     Mental Status: She is alert and oriented to person, place, and time.     Coordination: Coordination normal.  Psychiatric:        Attention and Perception: Attention and perception normal. She does not perceive auditory or visual hallucinations.        Mood and Affect: Mood normal. Mood is not anxious or depressed. Affect is not labile, blunt, angry or inappropriate.        Speech: Speech normal.        Behavior: Behavior normal.        Thought Content: Thought content normal. Thought content is not paranoid or delusional. Thought content does not include homicidal or suicidal ideation. Thought content does not include homicidal or suicidal plan.        Cognition and Memory: Cognition and memory normal.        Judgment: Judgment normal.     Comments: Insight intact     Lab Review:     Component Value Date/Time   NA 141 04/27/2024 0945   K 4.5 04/27/2024 0945   CL 110 (H) 04/27/2024 0945   CO2 17 (L) 04/27/2024 0945   GLUCOSE 82 04/27/2024 0945   GLUCOSE 96 04/10/2022 1051   BUN 19 04/27/2024 0945   CREATININE 0.86 04/27/2024 0945   CREATININE 0.75 01/30/2014 1309   CALCIUM 9.7 04/27/2024 0945   PROT 6.9 04/27/2024 0945   ALBUMIN 4.6 04/27/2024 0945   AST 12 04/27/2024 0945   ALT 16 04/27/2024 0945   ALKPHOS 61 04/27/2024 0945   BILITOT 0.3 04/27/2024 0945  GFRNONAA >60 04/10/2022 1051   GFRAA >60 05/01/2019 1303       Component Value Date/Time   WBC 5.6 04/27/2024 0945   WBC 13.7 (H)  04/02/2023 0418   RBC 4.76 04/27/2024 0945   RBC 3.16 (L) 04/02/2023 0418   HGB 13.8 04/27/2024 0945   HCT 44.2 04/27/2024 0945   PLT 230 04/27/2024 0945   MCV 93 04/27/2024 0945   MCH 29.0 04/27/2024 0945   MCH 27.5 04/02/2023 0418   MCHC 31.2 (L) 04/27/2024 0945   MCHC 32.5 04/02/2023 0418   RDW 12.7 04/27/2024 0945   LYMPHSABS 1.7 04/27/2024 0945   MONOABS 0.4 09/03/2008 2035   EOSABS 0.1 04/27/2024 0945   BASOSABS 0.1 04/27/2024 0945    No results found for: POCLITH, LITHIUM   No results found for: PHENYTOIN, PHENOBARB, VALPROATE, CBMZ   .res Assessment: Plan:    Plan:  Restart: Vraylar  1.5mg  daily Wellbutrin  XL 300mg  every morning - 150mg  x 7 days, then 300mg  daily  Hold: Adderall 20mg  daily Topamax  100mg  daily Buspar  10mg  TID for anxiety and irritability.  RTC 4 weeks  25 minutes spent dedicated to the care of this patient on the date of this encounter to include pre-visit review of records, ordering of medication, post visit documentation, and face-to-face time with the patient discussing ADHD, GAD, insomnia and Bipolar 1 disorder. Discussed restarting previous medication regimen.  Patient advised to contact office with any questions, adverse effects, or acute worsening in signs and symptoms.  Discussed potential benefits, risks, and side effects of stimulants with patient to include increased heart rate, palpitations, insomnia, increased anxiety, increased irritability, or decreased appetite.  Instructed patient to contact office if experiencing any significant tolerability issues.   Discussed potential metabolic side effects associated with atypical antipsychotics, as well as potential risk for movement side effects. Advised pt to contact office if movement side effects occur.    There are no diagnoses linked to this encounter.   Please see After Visit Summary for patient specific instructions.  Future Appointments  Date Time Provider Department  Center  06/14/2024  9:30 AM Destony Prevost, Angeline Mattocks, NP [facility] None  07/28/2024  9:00 AM Herold Hadassah SQUIBB, MD [facility] [address]  07/31/2024  8:15 AM [facility]NURSE [facility] None    No orders of the defined types were placed in this encounter.     -------------------------------

## 2024-07-28 ENCOUNTER — Ambulatory Visit: Admitting: Pediatrics

## 2024-07-31 ENCOUNTER — Ambulatory Visit: Payer: Self-pay

## 2024-07-31 VITALS — BP 123/81 | HR 81 | Ht 63.0 in | Wt 229.0 lb

## 2024-07-31 DIAGNOSIS — Z3042 Encounter for surveillance of injectable contraceptive: Secondary | ICD-10-CM

## 2024-07-31 MED ORDER — MEDROXYPROGESTERONE ACETATE 150 MG/ML IM SUSP
150.0000 mg | Freq: Once | INTRAMUSCULAR | Status: AC
  Administered 2024-07-31: 09:00:00 150 mg via INTRAMUSCULAR

## 2024-07-31 NOTE — Patient Instructions (Signed)

## 2024-07-31 NOTE — Progress Notes (Signed)
° ° °  NURSE VISIT NOTE  Subjective:    Patient ID: Jackson LOISE Harlem, female    DOB: 1996-02-26, 28 y.o.   MRN: [id number]  HPI  Patient is a 28 y.o. G29P1011 female who presents for depo provera  injection.   Objective:    BP 123/81   Pulse 81   Ht 5' 3 (1.6 m)   Wt 229 lb (103.9 kg)   LMP  (LMP Unknown)   Breastfeeding No   BMI 40.57 kg/m   Last Annual: 10/11/23. Last pap: 05/14/23. Last Depo-Provera : 05/15/24. Side Effects if any: none. Serum HCG indicated? No . Depo-Provera  150 mg IM given by: Rollo Maxin, CMA. Site: Right Deltoid  Lab Review  No results found for any visits on 07/31/24.  Assessment:   1. Surveillance for Depo-Provera  contraception      Plan:   Next appointment due between 10/16/24 and 10/30/24.    Rollo JINNY Maxin, CMA

## 2024-10-23 ENCOUNTER — Ambulatory Visit
